# Patient Record
Sex: Female | Born: 1960 | Race: Black or African American | Hispanic: No | Marital: Single | Smoking: Never smoker
Health system: Southern US, Community
[De-identification: ages and names within clinical notes are randomized; demographics above are authoritative.]

## PROBLEM LIST (undated history)

## (undated) DIAGNOSIS — T7840XA Allergy, unspecified, initial encounter: Secondary | ICD-10-CM

## (undated) DIAGNOSIS — E785 Hyperlipidemia, unspecified: Secondary | ICD-10-CM

## (undated) DIAGNOSIS — M1990 Unspecified osteoarthritis, unspecified site: Secondary | ICD-10-CM

## (undated) DIAGNOSIS — I10 Essential (primary) hypertension: Secondary | ICD-10-CM

## (undated) HISTORY — DX: Unspecified osteoarthritis, unspecified site: M19.90

## (undated) HISTORY — PX: BREAST SURGERY: SHX581

## (undated) HISTORY — DX: Hyperlipidemia, unspecified: E78.5

## (undated) HISTORY — DX: Allergy, unspecified, initial encounter: T78.40XA

## (undated) HISTORY — DX: Essential (primary) hypertension: I10

---

## 1978-04-01 HISTORY — PX: BREAST CYST EXCISION: SHX579

## 1990-04-01 HISTORY — PX: TUBAL LIGATION: SHX77

## 1992-04-01 HISTORY — PX: KNEE SURGERY: SHX244

## 1996-04-01 HISTORY — PX: APPENDECTOMY: SHX54

## 1998-03-30 ENCOUNTER — Other Ambulatory Visit: Admission: RE | Admit: 1998-03-30 | Discharge: 1998-03-30 | Payer: Self-pay | Admitting: *Deleted

## 1998-09-04 ENCOUNTER — Emergency Department (HOSPITAL_COMMUNITY): Admission: EM | Admit: 1998-09-04 | Discharge: 1998-09-04 | Payer: Self-pay | Admitting: Internal Medicine

## 1999-02-27 ENCOUNTER — Emergency Department (HOSPITAL_COMMUNITY): Admission: EM | Admit: 1999-02-27 | Discharge: 1999-02-27 | Payer: Self-pay | Admitting: Emergency Medicine

## 1999-05-10 ENCOUNTER — Other Ambulatory Visit: Admission: RE | Admit: 1999-05-10 | Discharge: 1999-05-10 | Payer: Self-pay | Admitting: *Deleted

## 1999-09-23 ENCOUNTER — Encounter: Payer: Self-pay | Admitting: Emergency Medicine

## 1999-09-23 ENCOUNTER — Emergency Department (HOSPITAL_COMMUNITY): Admission: EM | Admit: 1999-09-23 | Discharge: 1999-09-23 | Payer: Self-pay | Admitting: Emergency Medicine

## 2000-05-30 ENCOUNTER — Other Ambulatory Visit: Admission: RE | Admit: 2000-05-30 | Discharge: 2000-05-30 | Payer: Self-pay | Admitting: *Deleted

## 2000-07-14 ENCOUNTER — Emergency Department (HOSPITAL_COMMUNITY): Admission: EM | Admit: 2000-07-14 | Discharge: 2000-07-14 | Payer: Self-pay | Admitting: Emergency Medicine

## 2001-06-09 ENCOUNTER — Other Ambulatory Visit: Admission: RE | Admit: 2001-06-09 | Discharge: 2001-06-09 | Payer: Self-pay | Admitting: *Deleted

## 2001-07-30 ENCOUNTER — Encounter: Payer: Self-pay | Admitting: Emergency Medicine

## 2001-07-30 ENCOUNTER — Emergency Department (HOSPITAL_COMMUNITY): Admission: EM | Admit: 2001-07-30 | Discharge: 2001-07-31 | Payer: Self-pay | Admitting: Emergency Medicine

## 2001-08-10 ENCOUNTER — Encounter (INDEPENDENT_AMBULATORY_CARE_PROVIDER_SITE_OTHER): Payer: Self-pay

## 2001-08-11 ENCOUNTER — Encounter: Payer: Self-pay | Admitting: Emergency Medicine

## 2001-08-11 ENCOUNTER — Observation Stay (HOSPITAL_COMMUNITY): Admission: EM | Admit: 2001-08-11 | Discharge: 2001-08-12 | Payer: Self-pay | Admitting: Emergency Medicine

## 2001-08-11 ENCOUNTER — Encounter: Payer: Self-pay | Admitting: Surgery

## 2002-11-23 ENCOUNTER — Emergency Department (HOSPITAL_COMMUNITY): Admission: EM | Admit: 2002-11-23 | Discharge: 2002-11-23 | Payer: Self-pay | Admitting: Emergency Medicine

## 2002-11-23 ENCOUNTER — Encounter: Payer: Self-pay | Admitting: Emergency Medicine

## 2003-02-12 ENCOUNTER — Emergency Department (HOSPITAL_COMMUNITY): Admission: AD | Admit: 2003-02-12 | Discharge: 2003-02-12 | Payer: Self-pay | Admitting: Family Medicine

## 2003-07-06 ENCOUNTER — Emergency Department (HOSPITAL_COMMUNITY): Admission: AD | Admit: 2003-07-06 | Discharge: 2003-07-06 | Payer: Self-pay | Admitting: Family Medicine

## 2004-01-27 ENCOUNTER — Emergency Department (HOSPITAL_COMMUNITY): Admission: EM | Admit: 2004-01-27 | Discharge: 2004-01-27 | Payer: Self-pay | Admitting: Emergency Medicine

## 2004-04-16 ENCOUNTER — Encounter (INDEPENDENT_AMBULATORY_CARE_PROVIDER_SITE_OTHER): Payer: Self-pay | Admitting: Specialist

## 2004-04-16 ENCOUNTER — Ambulatory Visit (HOSPITAL_COMMUNITY): Admission: RE | Admit: 2004-04-16 | Discharge: 2004-04-16 | Payer: Self-pay | Admitting: *Deleted

## 2004-05-07 ENCOUNTER — Emergency Department (HOSPITAL_COMMUNITY): Admission: EM | Admit: 2004-05-07 | Discharge: 2004-05-07 | Payer: Self-pay | Admitting: Family Medicine

## 2004-08-21 ENCOUNTER — Emergency Department (HOSPITAL_COMMUNITY): Admission: EM | Admit: 2004-08-21 | Discharge: 2004-08-21 | Payer: Self-pay | Admitting: Family Medicine

## 2004-12-23 ENCOUNTER — Emergency Department (HOSPITAL_COMMUNITY): Admission: EM | Admit: 2004-12-23 | Discharge: 2004-12-23 | Payer: Self-pay | Admitting: Emergency Medicine

## 2004-12-26 ENCOUNTER — Emergency Department (HOSPITAL_COMMUNITY): Admission: EM | Admit: 2004-12-26 | Discharge: 2004-12-26 | Payer: Self-pay | Admitting: Family Medicine

## 2005-04-04 ENCOUNTER — Emergency Department (HOSPITAL_COMMUNITY): Admission: EM | Admit: 2005-04-04 | Discharge: 2005-04-04 | Payer: Self-pay | Admitting: Emergency Medicine

## 2006-07-07 ENCOUNTER — Emergency Department (HOSPITAL_COMMUNITY): Admission: EM | Admit: 2006-07-07 | Discharge: 2006-07-07 | Payer: Self-pay | Admitting: Family Medicine

## 2008-09-01 ENCOUNTER — Emergency Department (HOSPITAL_COMMUNITY): Admission: EM | Admit: 2008-09-01 | Discharge: 2008-09-01 | Payer: Self-pay | Admitting: Emergency Medicine

## 2009-06-01 ENCOUNTER — Emergency Department (HOSPITAL_COMMUNITY): Admission: EM | Admit: 2009-06-01 | Discharge: 2009-06-01 | Payer: Self-pay | Admitting: Emergency Medicine

## 2009-12-22 ENCOUNTER — Emergency Department (HOSPITAL_COMMUNITY): Admission: EM | Admit: 2009-12-22 | Discharge: 2009-12-22 | Payer: Self-pay | Admitting: Family Medicine

## 2010-02-09 ENCOUNTER — Emergency Department (HOSPITAL_COMMUNITY): Admission: EM | Admit: 2010-02-09 | Discharge: 2010-02-09 | Payer: Self-pay | Admitting: Emergency Medicine

## 2010-06-12 LAB — URINALYSIS, ROUTINE W REFLEX MICROSCOPIC
Specific Gravity, Urine: 1.021 (ref 1.005–1.030)
pH: 5.5 (ref 5.0–8.0)

## 2010-06-22 LAB — DIFFERENTIAL
Basophils Absolute: 0.1 10*3/uL (ref 0.0–0.1)
Basophils Relative: 1 % (ref 0–1)
Lymphocytes Relative: 33 % (ref 12–46)
Lymphs Abs: 3.3 10*3/uL (ref 0.7–4.0)

## 2010-06-22 LAB — COMPREHENSIVE METABOLIC PANEL
Albumin: 4 g/dL (ref 3.5–5.2)
CO2: 30 mEq/L (ref 19–32)
Calcium: 9.6 mg/dL (ref 8.4–10.5)
Chloride: 102 mEq/L (ref 96–112)
Creatinine, Ser: 0.89 mg/dL (ref 0.4–1.2)
GFR calc Af Amer: >60 mL/min (ref >60)
GFR calc non Af Amer: >60 mL/min (ref >60)
Glucose, Bld: 101 mg/dL — ABNORMAL HIGH (ref 70–99)
Potassium: 4.9 mEq/L (ref 3.5–5.1)

## 2010-06-22 LAB — URINALYSIS, ROUTINE W REFLEX MICROSCOPIC
Hgb urine dipstick: NEGATIVE
Ketones, ur: NEGATIVE mg/dL
Nitrite: NEGATIVE
Protein, ur: 30 mg/dL — AB
Urobilinogen, UA: 1 mg/dL (ref 0.0–1.0)
pH: 7 (ref 5.0–8.0)

## 2010-06-22 LAB — URINE MICROSCOPIC-ADD ON

## 2010-06-22 LAB — LIPASE, BLOOD: Lipase: 38 U/L (ref 11–59)

## 2010-06-22 LAB — CBC
MCV: 82.3 fL (ref 78.0–100.0)
Platelets: 278 10*3/uL (ref 150–400)
RDW: 14.2 % (ref 11.5–15.5)
WBC: 10 10*3/uL (ref 4.0–10.5)

## 2010-08-17 NOTE — Op Note (Signed)
[facility]  Patient:    Mackenzie Everett, Mackenzie Everett Visit Number: [id number] MRN: [id number]          Service Type: MED Location: 1E 0102 01 Attending Physician:  Nelia Shi Dictated by:   Thornton Park Daphine Deutscher, M.D. Proc. Date: 08/11/01 Admit Date:  08/10/2001                             Operative Report  PREOPERATIVE DIAGNOSIS:  Acute appendicitis.  POSTOPERATIVE DIAGNOSIS:  Acute appendicitis.  PROCEDURE:  Laparoscopic appendectomy.  SURGEON:  Thornton Park. Daphine Deutscher, M.D.  ANESTHESIA:  General.  DESCRIPTION OF PROCEDURE:  Mackenzie Everett is a 50 year old lady, who presented to the ED, with lower abdominal pain of one days duration.  CT scan was performed, and this showed a thickened appendix consistent with appendicitis. She was seen in the ED by me, and then informed consent was obtained regarding laparoscopic as well as open appendectomy.  The patient was taken to room 1, given general anesthesia.  She had previously gotten Cefotan intravenously.  The abdomen was prepped with Betadine and draped sterilely.  A longitudinal incision was made down into the umbilicus, and through this port, Hasson cannula was placed.  A 10-11 was placed in the left lower quadrant, a 5 mm in right upper quadrant.  The appendix itself appeared huge and was thickened.  It looked like she had just recently ruptured an ovarian follicle.  There was not very much blood in the pelvis, however.  The appendix being real thickened, I went ahead and elevated it and isolated the base and transected with the Endo GIA.  The mesentery of this long appendix was such that it took several applications of the stapler to divide it, and then it was placed in a bag and brought out through the umbilicus.  The area was irrigated.  There was no active bleeding noted.  The healthy stump remained.  Again, the area was surveyed, and no bleeding was noted.  The port sites were injected with 0.25% Marcaine with  epinephrine. The umbilical defect was repaired with two sutures of 0 Vicryl.  The patient seemed to tolerate the procedure well.  Abdomen was deflated; the port sites were removed, and the skin was closed with 4-0 Vicryl with Benzoin and Steri-Strips.  FINAL DIAGNOSIS:  Acute appendicitis, status post laparoscopic appendectomy. Dictated by:   Thornton Park Daphine Deutscher, M.D. Attending Physician:  Nelia Shi DD:  08/11/01 TD:  08/11/01 Job: [id number]

## 2010-08-17 NOTE — Op Note (Signed)
NAME:  Mackenzie Everett, Mackenzie Everett                  ACCOUNT NO.:  [id number]   MEDICAL RECORD NO.:  [id number]          PATIENT TYPE:  AMB   LOCATION:  SDC                           FACILITY:  [facility]   PHYSICIAN:  Pershing Cox, M.D.DATE OF BIRTH:  May 02, 1960   DATE OF PROCEDURE:  04/16/2004  DATE OF DISCHARGE:                                 OPERATIVE REPORT   PREOPERATIVE DIAGNOSES:  Menorrhagia and endometrial polyp on hydrosonogram.   POSTOPERATIVE DIAGNOSES:  Menorrhagia and small polyp.   PROCEDURE:  Examination under anesthesia, dilatation and curettage,  hysteroscopy with polyp extraction.  NovaSure endometrial ablation.   SURGEON:  Dr. Carey Bullocks.   ANESTHESIA:  General by LMA.   INDICATIONS FOR PROCEDURE:  Mackenzie Everett is 50 years old.  She has had significant  menorrhagia with pad change every two hours on three heavy days.  She has  also had large clots and night swelling.  On hydrosonogram, she was found to  have a small fundal polyp and is brought to the operating room today for  removal of this polyp and NovaSure endometrial ablation.   FINDINGS:  The patient's endometrial cavity sounded to a depth of 9 cm.  The  cervix sounded to 3.5 cm.  There was a filling defect at the fundus of the  uterus.  This was extracted with polyps, forceps and curettage and at the  end of the procedure, photographs documented that it was gone.   PROCEDURE:  Mackenzie Everett was brought to the operating room with an IV in  place. She received 400 mg of ciprofloxacin which was completed on the  operating room table.  Supine on the OR table, general endotracheal  anesthesia was administered by the placement of an LMA.  The patient was  adequately sedated and then placed into Allen stirrups.  Examination under  anesthesia was performed.  The patient was then prepped with solution of  Hibiclens, cleansing the peroneum and vagina.  The Foley catheter was used  to empty the bladder of a small amount of urine.   The  patient was then draped for a sterile vaginal procedure.  Bivalve  speculum was inserted into the vagina and the cervix was grasped with a  single-toothed tenaculum.  The tenaculum was positioned several times in  order to be able to move the cervix into a position for straight dilatation.  A Simms retractor was used to retract the posterior wall of the uterus as a  weighted vaginal speculum was not adequate.  The Kevorkian curet was used to  obtain endocervical curetting's.  The Hagar dilator was then used to assess  the depth of the endocervical canal which was felt to be 3.5 cm.  The  endometrial canal sounded to a depth of 9 cm.  This depth was subtracted  from the cervical length and set into the NovaSure for later ablation.  The  observer hysteroscope was introduced into the endometrial cavity after  dilating the cervix to size 21 Pratt dilator.  Hysteroscopic view of the  endometrium was obtained.  There was a lot of endometrium  and there appeared  to be a polyp sitting at the fundus.  This was rounded in shape.  Using the  polyp forceps and the small sharp curet, the cavity was curetted.  The polyp  was then lifted and extracted with polyp forceps.  It appeared that the  polyp was easily removed.  With this information, we then pursued with the  NovaSure ablation.  With the appropriate setting dialed as 5.5 inches as the  patient's depth of cavity, the instrument was introduced into the  endometrial cavity until the fundus was touched and then retracted about 1.5  to 2 cm.  The instrument was closed and then seated appropriately into the  endometrial cavity by pushing, pulling, rotating and then finally accessing  the cavity width of about 4.8 cm.  The collar was placed against the cervix  and the cavity was sounded as intact.  The NovaSure instrument was then  applied to the cavity for one minute and one second to complete the ablation  procedure.  The array was closed and the  instrument was retracted.  A  photograph was taken of the top of the endometrium to show that the ablation  procedure had been complete.   The patient tolerated the procedure well and was taken to the recovery room  for observation.  She received Toradol in the recovery room.     Maur   MAJ/MEDQ  D:  04/16/2004  T:  04/16/2004  Job:  [id number]

## 2011-08-30 ENCOUNTER — Ambulatory Visit (INDEPENDENT_AMBULATORY_CARE_PROVIDER_SITE_OTHER): Payer: BC Managed Care – PPO | Admitting: Family Medicine

## 2011-08-30 ENCOUNTER — Encounter: Payer: Self-pay | Admitting: Family Medicine

## 2011-08-30 VITALS — BP 139/82 | HR 68 | Temp 98.0°F | Resp 18 | Ht 69.5 in | Wt 293.0 lb

## 2011-08-30 DIAGNOSIS — Z0000 Encounter for general adult medical examination without abnormal findings: Secondary | ICD-10-CM

## 2011-08-30 DIAGNOSIS — Z1231 Encounter for screening mammogram for malignant neoplasm of breast: Secondary | ICD-10-CM

## 2011-08-30 DIAGNOSIS — Z124 Encounter for screening for malignant neoplasm of cervix: Secondary | ICD-10-CM

## 2011-08-30 DIAGNOSIS — Z01419 Encounter for gynecological examination (general) (routine) without abnormal findings: Secondary | ICD-10-CM

## 2011-08-30 DIAGNOSIS — G43809 Other migraine, not intractable, without status migrainosus: Secondary | ICD-10-CM

## 2011-08-30 DIAGNOSIS — M5136 Other intervertebral disc degeneration, lumbar region: Secondary | ICD-10-CM

## 2011-08-30 DIAGNOSIS — Z1239 Encounter for other screening for malignant neoplasm of breast: Secondary | ICD-10-CM

## 2011-08-30 DIAGNOSIS — M5137 Other intervertebral disc degeneration, lumbosacral region: Secondary | ICD-10-CM

## 2011-08-30 LAB — POCT URINALYSIS DIPSTICK
Bilirubin, UA: NEGATIVE
Glucose, UA: NEGATIVE
Nitrite, UA: NEGATIVE
Protein, UA: 100

## 2011-08-30 LAB — LIPID PANEL
HDL: 60 mg/dL (ref >39)
LDL Cholesterol: 146 mg/dL — ABNORMAL HIGH (ref 0–99)
VLDL: 16 mg/dL (ref 0–40)

## 2011-08-30 LAB — POCT WET PREP WITH KOH

## 2011-08-30 LAB — COMPREHENSIVE METABOLIC PANEL
Albumin: 4.4 g/dL (ref 3.5–5.2)
BUN: 14 mg/dL (ref 6–23)
Glucose, Bld: 86 mg/dL (ref 70–99)
Potassium: 4.4 mEq/L (ref 3.5–5.3)
Sodium: 138 mEq/L (ref 135–145)

## 2011-08-30 LAB — TSH: TSH: 0.958 u[IU]/mL (ref 0.350–4.500)

## 2011-08-30 MED ORDER — RIZATRIPTAN BENZOATE 10 MG PO TABS: 10.0000 mg | ORAL_TABLET | ORAL | Status: DC | PRN

## 2011-08-30 NOTE — Patient Instructions (Signed)
Migraine Headache A migraine headache is an intense, throbbing pain on one or both sides of your head. The exact cause of a migraine headache is not always known. A migraine may be caused when nerves in the brain become irritated and release chemicals that cause swelling within blood vessels, causing pain. Many migraine sufferers have a family history of migraines. Before you get a migraine you may or may not get an aura. An aura is a group of symptoms that can predict the beginning of a migraine. An aura may include:  Visual changes such as:   Flashing lights.   Bright spots or zig-zag lines.   Tunnel vision.   Feelings of numbness.   Trouble talking.   Muscle weakness.  SYMPTOMS  Pain on one or both sides of your head.   Pain that is pulsating or throbbing in nature.   Pain that is severe enough to prevent daily activities.   Pain that is aggravated by any daily physical activity.   Nausea (feeling sick to your stomach), vomiting, or both.   Pain with exposure to bright lights, loud noises, or activity.   General sensitivity to bright lights or loud noises.  MIGRAINE TRIGGERS Examples of triggers of migraine headaches include:   Alcohol.   Smoking.   Stress.   It may be related to menses (female menstruation).   Aged cheeses.   Foods or drinks that contain nitrates, glutamate, aspartame, or tyramine.   Lack of sleep.   Chocolate.   Caffeine.   Hunger.   Medications such as nitroglycerine (used to treat chest pain), birth control pills, estrogen, and some blood pressure medications.  DIAGNOSIS  A migraine headache is often diagnosed based on:  Symptoms.   Physical examination.   A computerized X-ray scan (computed tomography, CT) of your head.  TREATMENT  Medications can help prevent migraines if they are recurrent or should they become recurrent. Your caregiver can help you with a medication or treatment program that will be helpful to you.   Lying  down in a dark, quiet room may be helpful.   Keeping a headache diary may help you find a trend as to what may be triggering your headaches.  SEEK IMMEDIATE MEDICAL CARE IF:   You have confusion, personality changes or seizures.   You have headaches that wake you from sleep.   You have an increased frequency in your headaches.   You have a stiff neck.   You have a loss of vision.   You have muscle weakness.   You start losing your balance or have trouble walking.   You feel faint or pass out.  MAKE SURE YOU:   Understand these instructions.   Will watch your condition.   Will get help right away if you are not doing well or get worse.  Document Released: 03/18/2005 Document Revised: 03/07/2011 Document Reviewed: 11/01/2008 Exit[employer] Patient Information 2012 [employer], [place]     Obesity Obesity is defined as having a body mass index (BMI) of 30 or more. To calculate your BMI divide your weight in pounds by your height in inches squared and multiply that product by 703. Major illnesses resulting from long-term obesity include:  Stroke.   Heart disease.   Diabetes.   Many cancers.   Arthritis.  Obesity also complicates recovery from many other medical problems.  CAUSES   A history of obesity in your parents.   Thyroid hormone imbalance.   Environmental factors such as excess calorie intake and physical  inactivity.  TREATMENT  A healthy weight loss program includes:  A calorie restricted diet based on individual calorie needs.   Increased physical activity (exercise).  An exercise program is just as important as the right low-calorie diet.  Weight-loss medicines should be used only under the supervision of your physician. These medicines help, but only if they are used with diet and exercise programs. Medicines can have side effects including nervousness, nausea, abdominal pain, diarrhea, headache, drowsiness, and depression.  An unhealthy weight loss program  includes:  Fasting.   Fad diets.   Supplements and drugs.  These choices do not succeed in long-term weight control.  HOME CARE INSTRUCTIONS  To help you make the needed dietary changes:   Exercise and perform physical activity as directed by your caregiver.   Keep a daily record of everything you eat. There are many free websites to help you with this. It may be helpful to measure your foods so you can determine if you are eating the correct portion sizes.   Use low-calorie cookbooks or take special cooking classes.   Avoid alcohol. Drink more water and drinks with no calories.   Take vitamins and supplements only as recommended by your caregiver.   Weight loss support groups, Registered Dieticians, counselors, and stress reduction education can also be very helpful.  Document Released: 04/25/2004 Document Revised: 03/07/2011 Document Reviewed: 02/22/2007 [facility] Patient Information 2012 [place], [place].   Marland KitchenKeeping You Healthy  Get These Tests  Blood Pressure- Have your blood pressure checked by your healthcare provider at least once a year.  Normal blood pressure is 120/80.  Weight- Have your body mass index (BMI) calculated to screen for obesity.  BMI is a measure of body fat based on height and weight.  You can calculate your own BMI at https://www.[place]-esparza.com/  Cholesterol- Have your cholesterol checked every year.  Diabetes- Have your blood sugar checked every year if you have high blood pressure, high cholesterol, a family history of diabetes or if you are overweight.  Pap Smear- Have a pap smear every 1 to 3 years if you have been sexually active.  If you are older than 65 and recent pap smears have been normal you may not need additional pap smears.  In addition, if you have had a hysterectomy  For benign disease additional pap smears are not necessary.  Mammogram-Yearly mammograms are essential for early detection of breast cancer  Screening for Colon Cancer-  Colonoscopy starting at age 72. Screening may begin sooner depending on your family history and other health conditions.  Follow up colonoscopy as directed by your Gastroenterologist.  Screening for Osteoporosis- Screening begins at age 93 with bone density scanning, sooner if you are at higher risk for developing Osteoporosis.  Get these medicines  Calcium with Vitamin D- Your body requires 1200-1500 mg of Calcium a day and [phone] IU of Vitamin D a day.  You can only absorb 500 mg of Calcium at a time therefore Calcium must be taken in 2 or 3 separate doses throughout the day.  Hormones- Hormone therapy has been associated with increased risk for certain cancers and heart disease.  Talk to your healthcare provider about if you need relief from menopausal symptoms.  Aspirin- Ask your healthcare provider about taking Aspirin to prevent Heart Disease and Stroke.  Get these Immuniztions  Flu shot- Every fall  Pneumonia shot- Once after the age of 29; if you are younger ask your healthcare provider if you need a pneumonia shot.  Tetanus- Every ten years.  Zostavax- Once after the age of 81 to prevent shingles.  Take these steps  Don't smoke- Your healthcare provider can help you quit. For tips on how to quit, ask your healthcare provider or go to www.smokefree.gov or call 1-800 QUIT-NOW.  Be physically active- Exercise 5 days a week for a minimum of 30 minutes.  If you are not already physically active, start slow and gradually work up to 30 minutes of moderate physical activity.  Try walking, dancing, bike riding, swimming, etc.  Eat a healthy diet- Eat a variety of healthy foods such as fruits, vegetables, whole grains, low fat milk, low fat cheeses, yogurt, lean meats, chicken, fish, eggs, dried beans, tofu, etc.  For more information go to www.thenutritionsource.org  Dental visit- Brush and floss teeth twice daily; visit your dentist twice a year.  Eye exam- Visit your Optometrist  or Ophthalmologist yearly.  Drink alcohol in moderation- Limit alcohol intake to one drink or less a day.  Never drink and drive.  Depression- Your emotional health is as important as your physical health.  If you're feeling down or losing interest in things you normally enjoy, please talk to your healthcare provider.  Seat Belts- can save your life; always wear one  Smoke/Carbon Monoxide detectors- These detectors need to be installed on the appropriate level of your home.  Replace batteries at least once a year.  Violence- If anyone is threatening or hurting you, please tell your healthcare provider.  Living Will/ Health care power of attorney- Discuss with your healthcare provider and family.

## 2011-08-31 LAB — VITAMIN D 25 HYDROXY (VIT D DEFICIENCY, FRACTURES): Vit D, 25-Hydroxy: 11 ng/mL — ABNORMAL LOW (ref 30–89)

## 2011-09-04 ENCOUNTER — Encounter: Payer: Self-pay | Admitting: Family Medicine

## 2011-09-04 DIAGNOSIS — G43809 Other migraine, not intractable, without status migrainosus: Secondary | ICD-10-CM | POA: Insufficient documentation

## 2011-09-04 DIAGNOSIS — J45909 Unspecified asthma, uncomplicated: Secondary | ICD-10-CM | POA: Insufficient documentation

## 2011-09-04 DIAGNOSIS — E785 Hyperlipidemia, unspecified: Secondary | ICD-10-CM | POA: Insufficient documentation

## 2011-09-04 DIAGNOSIS — M5136 Other intervertebral disc degeneration, lumbar region: Secondary | ICD-10-CM | POA: Insufficient documentation

## 2011-09-04 HISTORY — DX: Hyperlipidemia, unspecified: E78.5

## 2011-09-04 LAB — PAP IG W/ RFLX HPV ASCU

## 2011-09-04 NOTE — Progress Notes (Signed)
Subjective:    Patient ID: Mackenzie Everett, female    DOB: 07-29-1960, 51 y.o.   MRN: [id number]  HPI  This 51 y.o. AA female is here for CPE/PAP. Her most significant chronic problem is Migraine headache  involving right temple and eye, lasts 2-3 days, associated with nausea and mild visual disturbance. [facility].She tried sister's Maxalt 5 mg with some relief of HA.  She would like RX for this medication.          She works as a Neurosurgeon; nonsmoker and seldom drinks and does not exercise.   OB/GYN hx: 3 pregnancies, 1 abortion, 4 living children   Last PAP: 2009- normal    Review of Systems  Constitutional: Positive for fatigue. Negative for fever and unexpected weight change.  HENT: Positive for sinus pressure and tinnitus. Negative for hearing loss, congestion, sore throat, rhinorrhea, sneezing, trouble swallowing, postnasal drip and ear discharge.   Eyes: Negative.   Respiratory: Negative.   Cardiovascular: Negative.   Gastrointestinal: Negative.   Genitourinary: Positive for urgency. Negative for dysuria, frequency, hematuria, flank pain, vaginal discharge, difficulty urinating, menstrual problem, pelvic pain and dyspareunia.  Musculoskeletal: Negative.   Skin: Negative.   Neurological: Positive for headaches. Negative for dizziness, tremors, seizures, syncope, weakness and numbness.  Hematological: Negative.   Psychiatric/Behavioral: Negative.        Objective:   Physical Exam  Nursing note and vitals reviewed. Constitutional: She is oriented to person, place, and time. She appears well-developed and well-nourished. No distress.  HENT:  Head: Normocephalic and atraumatic.  Right Ear: External ear normal.  Left Ear: External ear normal.  Nose: Nose normal.  Mouth/Throat: Oropharynx is clear and moist.  Eyes: Conjunctivae are normal. Pupils are equal, round, and reactive to light. No scleral icterus.  Neck: Normal range of  motion. Neck supple.  Cardiovascular: Normal rate, regular rhythm, normal heart sounds and intact distal pulses.  Exam reveals no gallop.   No murmur heard. Pulmonary/Chest: Effort normal. No respiratory distress. She has no wheezes. Right breast exhibits no inverted nipple, no mass, no nipple discharge, no skin change and no tenderness. Left breast exhibits no inverted nipple, no mass, no nipple discharge, no skin change and no tenderness. Breasts are symmetrical.  Abdominal: Soft. Bowel sounds are normal. She exhibits no distension and no mass. There is no tenderness. There is no guarding.       No organomegaly  Genitourinary: Rectum normal and uterus normal. Rectal exam shows no external hemorrhoid, no internal hemorrhoid, no mass, no tenderness and anal tone normal. Guaiac negative stool. There is no rash, tenderness or lesion on the right labia. There is no rash, tenderness or lesion on the left labia. Cervix exhibits discharge. Cervix exhibits no motion tenderness and no friability. Right adnexum displays no mass, no tenderness and no fullness. Left adnexum displays no mass, no tenderness and no fullness. No tenderness or bleeding around the vagina. No foreign body around the vagina. Vaginal discharge found.  Lymphadenopathy:    She has no cervical adenopathy.       Right: No inguinal adenopathy present.       Left: No inguinal adenopathy present.  Neurological: She is alert and oriented to person, place, and time. She has normal reflexes. No cranial nerve deficit. She exhibits normal muscle tone. Coordination normal.  Skin: Skin is warm and dry. No rash noted.  Psychiatric: She has a normal mood and affect. Her behavior  is normal. Judgment and thought content normal.          Assessment & Plan:

## 2011-09-05 ENCOUNTER — Telehealth: Payer: Self-pay | Admitting: Family Medicine

## 2011-09-05 MED ORDER — ERGOCALCIFEROL 1.25 MG (50000 UT) PO CAPS: 50000.0000 [IU] | ORAL_CAPSULE | ORAL | Status: DC

## 2011-09-05 NOTE — Progress Notes (Signed)
Quick Note:  Please call pt and advise that the following labs are abnormal... Vitamin D level is low; I have prescribed Vit D3 50,000 IU Take 1 capsule once a week for next several months.  Total cholesterol is elevated; work on healthier nutrition (eliminate fried and processed foods as well as "junk" foods) and be more active. Omega 3 Fish Oil 1200 mg 1 capsule daily will help lower cholesterol.  Thyroid test and chemistries are normal PAP is normal.  Copy to pt. ______

## 2011-09-05 NOTE — Telephone Encounter (Signed)
See note in addendum notification

## 2011-09-05 NOTE — Telephone Encounter (Signed)
Patient returned call and notified of labs. Patient voiced understanding. Copy mailed

## 2011-09-05 NOTE — Telephone Encounter (Signed)
Left message to return call 

## 2011-09-05 NOTE — Progress Notes (Signed)
Addended by: Dow Adolph B on: 09/05/2011 12:23 PM   Modules accepted: Orders

## 2011-09-14 ENCOUNTER — Other Ambulatory Visit: Payer: Self-pay | Admitting: *Deleted

## 2011-09-14 MED ORDER — ERGOCALCIFEROL 1.25 MG (50000 UT) PO CAPS: 50000.0000 [IU] | ORAL_CAPSULE | ORAL | Status: DC

## 2011-09-23 ENCOUNTER — Ambulatory Visit (HOSPITAL_COMMUNITY)
Admission: RE | Admit: 2011-09-23 | Discharge: 2011-09-23 | Disposition: A | Discharge status: Home / Self Care | Payer: BC Managed Care – PPO | Source: Ambulatory Visit | Attending: Family Medicine | Admitting: Family Medicine

## 2011-09-23 DIAGNOSIS — Z1231 Encounter for screening mammogram for malignant neoplasm of breast: Secondary | ICD-10-CM

## 2011-12-30 ENCOUNTER — Ambulatory Visit: Payer: BC Managed Care – PPO

## 2011-12-30 ENCOUNTER — Ambulatory Visit (INDEPENDENT_AMBULATORY_CARE_PROVIDER_SITE_OTHER): Payer: BC Managed Care – PPO | Admitting: Family Medicine

## 2011-12-30 VITALS — BP 144/82 | HR 94 | Temp 98.8°F | Resp 16 | Ht 70.0 in | Wt 296.0 lb

## 2011-12-30 DIAGNOSIS — M545 Low back pain, unspecified: Secondary | ICD-10-CM

## 2011-12-30 DIAGNOSIS — S39012A Strain of muscle, fascia and tendon of lower back, initial encounter: Secondary | ICD-10-CM

## 2011-12-30 DIAGNOSIS — G43909 Migraine, unspecified, not intractable, without status migrainosus: Secondary | ICD-10-CM

## 2011-12-30 MED ORDER — HYDROCODONE-ACETAMINOPHEN 5-500 MG PO TABS: ORAL_TABLET | ORAL | Status: DC

## 2011-12-30 MED ORDER — TIZANIDINE HCL 4 MG PO TABS: ORAL_TABLET | ORAL | Status: DC

## 2011-12-30 MED ORDER — DICLOFENAC SODIUM 75 MG PO TBEC: 75.0000 mg | DELAYED_RELEASE_TABLET | Freq: Two times a day (BID) | ORAL | Status: DC

## 2011-12-30 NOTE — Patient Instructions (Addendum)
Work hard on weight loss  Walk for exercise  Take meds as ordered.    If worse may need physical therapy for back or neurology referral for headaches

## 2011-12-30 NOTE — Progress Notes (Signed)
S: Migraine 4-5 days. Maxalt helps a while , but max of 2 a day does not solve it.  Missed 1/2 day Thurs , then again Friday.  Missed all day today.    Yesterday tried to get up from couch.  Acute low back and hip pain .  No other cause known.  O: HEENT normal.  Fundi benign.  No bruits.  Alert, oriented. Heart rrr Abd soft.  Tender low lumbar.  Muscles a little sore. ROM decreased. SLR tight but ok to 75 degrees bilaterally  A: Back pain acute  R/o compression fx.  Prob. Strain. Migraine, prolonged.  Plan: ls spine 2 view.  [facility] reading (PRIMARY) by  Dr. 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    See orders

## 2012-02-26 ENCOUNTER — Ambulatory Visit (INDEPENDENT_AMBULATORY_CARE_PROVIDER_SITE_OTHER): Payer: BC Managed Care – PPO | Admitting: Family Medicine

## 2012-02-26 VITALS — BP 122/78 | HR 101 | Temp 98.4°F | Resp 17 | Ht 71.0 in | Wt 301.0 lb

## 2012-02-26 DIAGNOSIS — Z23 Encounter for immunization: Secondary | ICD-10-CM

## 2012-02-26 DIAGNOSIS — J029 Acute pharyngitis, unspecified: Secondary | ICD-10-CM

## 2012-02-26 MED ORDER — AZITHROMYCIN 250 MG PO TABS: ORAL_TABLET | ORAL | Status: DC

## 2012-02-26 NOTE — Progress Notes (Signed)
@[email or web address] @   Patient ID: DEAJA RIZO MRN: [id number], DOB: June 25, 1960, 51 y.o. Date of Encounter: 02/26/2012, 12:17 PM  Primary Physician: No primary provider on file.  Chief Complaint:  Chief Complaint  Patient presents with  . Sore Throat  . Nasal Congestion    mucus   . Chills    HPI: 51 y.o. year old female presents with day history of sore throat. Subjective fever and chills. No cough, congestion, rhinorrhea, sinus pressure, otalgia, or headache. Normal hearing. No GI complaints. Able to swallow saliva, but hurts to do so. Decreased appetite secondary to sore throat.   Past Medical History  Diagnosis Date  . Allergy   . Arthritis   . Asthma   . Hyperlipidemia 09/04/2011     Home Meds: Prior to Admission medications   Medication Sig Start Date End Date Taking? Authorizing Provider  diclofenac (VOLTAREN) 75 MG EC tablet Take 1 tablet (75 mg total) by mouth 2 (two) times daily. 12/30/11  Yes Peyton Najjar, MD  ergocalciferol (DRISDOL) 50000 UNITS capsule Take 1 capsule (50,000 Units total) by mouth once a week. 09/14/11 09/13/12 Yes Maurice March, MD  fish oil-omega-3 fatty acids 1000 MG capsule Take 2 g by mouth daily.   Yes Historical Provider, MD  HYDROcodone-acetaminophen (VICODIN) 5-500 MG per tablet Use as needed for severe pain of back or headache 12/30/11  Yes Peyton Najjar, MD  Multiple Vitamin (MULTIVITAMIN) capsule Take 1 capsule by mouth daily.   Yes Historical Provider, MD  rizatriptan (MAXALT) 10 MG tablet Take 1 tablet (10 mg total) by mouth as needed for migraine. May repeat in 2 hours if needed 08/30/11 08/29/12 Yes Maurice March, MD  tiZANidine (ZANAFLEX) 4 MG tablet One every 6-8 hours for muscle relaxant and migraine as needed 12/30/11  Yes Peyton Najjar, MD  azithromycin (ZITHROMAX Z-PAK) 250 MG tablet Take as directed on pack 02/26/12   Elvina Sidle, MD    Allergies:  Allergies  Allergen Reactions  . Caffeine Other (See Comments)   migraines  . Penicillins Hives    History   Social History  . Marital Status: Single    Spouse Name: N/A    Number of Children: N/A  . Years of Education: N/A   Occupational History  . Not on file.   Social History Main Topics  . Smoking status: Never Smoker   . Smokeless tobacco: Not on file  . Alcohol Use: No     Comment: social  . Drug Use: No  . Sexually Active: No   Other Topics Concern  . Not on file   Social History Narrative  . No narrative on file     Review of Systems: Constitutional: negative for chills, fever, night sweats or weight changes HEENT: see above Cardiovascular: negative for chest pain or palpitations Respiratory: negative for hemoptysis, wheezing, or shortness of breath Abdominal: negative for abdominal pain, nausea, vomiting or diarrhea Dermatological: negative for rash Neurologic: negative for headache   Physical Exam Blood pressure 122/78, pulse 101, temperature 98.4 F (36.9 C), temperature source Oral, resp. rate 17, height 5\' 11"  (1.803 m), weight 301 lb (136.533 kg), SpO2 98.00%., Body mass index is 41.98 kg/(m^2). General: Well developed, well nourished, in no acute distress. Head: Normocephalic, atraumatic, eyes without discharge, sclera non-icteric, nares are patent. Bilateral auditory canals clear, TM's are without perforation, pearly grey with reflective cone of light bilaterally. No sinus TTP. Oral cavity moist, dentition normal. Posterior pharynx with post nasal drip  and mild erythema. No peritonsillar abscess or tonsillar exudate. Neck: Supple. No thyromegaly. Full ROM. No lymphadenopathy. Lungs: Clear bilaterally to auscultation without wheezes, rales, or rhonchi. Breathing is unlabored. Heart: RRR with S1 S2. No murmurs, rubs, or gallops appreciated. Abdomen: Soft, non-tender, non-distended with normoactive bowel sounds. No hepatomegaly. No rebound/guarding. No obvious abdominal masses. Msk:  Strength and tone normal for  age. Extremities: No clubbing or cyanosis. No edema. Neuro: Alert and oriented X 3. Moves all extremities spontaneously. CNII-XII grossly in tact. Psych:  Responds to questions appropriately with a normal affect.     ASSESSMENT AND PLAN:  51 y.o. year old female with pharyngitis 1. Pharyngitis  azithromycin (ZITHROMAX Z-PAK) 250 MG tablet   Flu shot today. - -Tylenol/Motrin prn -Rest/fluids -RTC precautions -RTC 3-5 days if no improvement  Signed, Elvina Sidle, MD 02/26/2012 12:17 PM

## 2012-02-26 NOTE — Patient Instructions (Signed)

## 2012-05-08 ENCOUNTER — Ambulatory Visit (INDEPENDENT_AMBULATORY_CARE_PROVIDER_SITE_OTHER): Payer: BC Managed Care – PPO | Admitting: Family Medicine

## 2012-05-08 VITALS — BP 153/82 | HR 58 | Temp 98.4°F | Resp 18 | Ht 70.0 in | Wt 306.4 lb

## 2012-05-08 DIAGNOSIS — M545 Low back pain, unspecified: Secondary | ICD-10-CM

## 2012-05-08 DIAGNOSIS — S39012A Strain of muscle, fascia and tendon of lower back, initial encounter: Secondary | ICD-10-CM

## 2012-05-08 DIAGNOSIS — R110 Nausea: Secondary | ICD-10-CM

## 2012-05-08 DIAGNOSIS — S335XXA Sprain of ligaments of lumbar spine, initial encounter: Secondary | ICD-10-CM

## 2012-05-08 LAB — POCT UA - MICROSCOPIC ONLY
Amorphous: POSITIVE
Casts, Ur, LPF, POC: NEGATIVE
Crystals, Ur, HPF, POC: NEGATIVE
Yeast, UA: NEGATIVE

## 2012-05-08 LAB — POCT URINALYSIS DIPSTICK: Urobilinogen, UA: 0.2

## 2012-05-08 NOTE — Progress Notes (Signed)
[address]   [place], [place]  [address]   [phone]  Subjective:    Patient ID: Mackenzie Everett, female    DOB: Apr 09, 1960, 52 y.o.   MRN: [id number]  HPIThis 52 y.o. female presents for evaluation of low back pain R. Onset two days ago.  Onset of R lower back pain; no appetite; mildly  Nauseated.  Not able to sleep; pain with sitting and walking.  Cannot get comfortable.  No fever but +chills.  No vomiting; +loose stools yesterday; no constipation; no bloody stools; no melena.  No heartburn.  No dysuria, frequency, hematuria, urgency, nocturia.  Movement and prolonged sitting makes worse.  Chronic back issues; this pain is totally different.  Cramping pain.  No shooting pain.  Constant pain.  Radiates from R back to R lateral side.  No radiation into legs; no n/t/w.  Normal b/b function.  No saddle paresthesias. No rash.  Took Ibuprofen 600 today; no medications yesterday.   Eating has no effect.      Review of Systems  Constitutional: Positive for chills. Negative for fever.  Respiratory: Negative for cough and shortness of breath.   Gastrointestinal: Positive for nausea and diarrhea. Negative for vomiting and abdominal pain.  Genitourinary: Negative for dysuria, urgency, frequency, hematuria, flank pain and pelvic pain.  Musculoskeletal: Positive for myalgias and back pain. Negative for arthralgias.  Skin: Negative for rash.  Neurological: Negative for weakness and numbness.        Past Medical History  Diagnosis Date  . Allergy   . Arthritis   . Asthma   . Hyperlipidemia 09/04/2011    Past Surgical History  Procedure Date  . Appendectomy   . Breast surgery   . Tubal ligation     Prior to Admission medications   Medication Sig Start Date End Date Taking? Authorizing Provider  diclofenac (VOLTAREN) 75 MG EC tablet Take 1 tablet (75 mg total) by mouth 2 (two) times daily. 12/30/11  Yes Peyton Najjar, MD  ergocalciferol (DRISDOL) 50000 UNITS capsule Take 1 capsule (50,000  Units total) by mouth once a week. 09/14/11 09/13/12 Yes Maurice March, MD  fish oil-omega-3 fatty acids 1000 MG capsule Take 2 g by mouth daily.   Yes Historical Provider, MD  HYDROcodone-acetaminophen (VICODIN) 5-500 MG per tablet Use as needed for severe pain of back or headache 12/30/11  Yes Peyton Najjar, MD  rizatriptan (MAXALT) 10 MG tablet Take 1 tablet (10 mg total) by mouth as needed for migraine. May repeat in 2 hours if needed 08/30/11 08/29/12 Yes Maurice March, MD  tiZANidine (ZANAFLEX) 4 MG tablet One every 6-8 hours for muscle relaxant and migraine as needed 12/30/11  Yes Peyton Najjar, MD  azithromycin (ZITHROMAX Z-PAK) 250 MG tablet Take as directed on pack 02/26/12   Elvina Sidle, MD  Multiple Vitamin (MULTIVITAMIN) capsule Take 1 capsule by mouth daily.    Historical Provider, MD    Allergies  Allergen Reactions  . Caffeine Other (See Comments)    migraines  . Penicillins Hives    History   Social History  . Marital Status: Single    Spouse Name: N/A    Number of Children: N/A  . Years of Education: N/A   Occupational History  . Not on file.   Social History Main Topics  . Smoking status: Never Smoker   . Smokeless tobacco: Not on file  . Alcohol Use: No     Comment: social  . Drug  Use: No  . Sexually Active: No   Other Topics Concern  . Not on file   Social History Narrative  . No narrative on file    Family History  Problem Relation Age of Onset  . Arthritis Mother   . Diabetes Mother   . Hyperlipidemia Mother   . Hypertension Mother   . Hyperlipidemia Sister   . Hypertension Sister   . Lupus Sister   . Cancer Maternal Aunt   . Diabetes Maternal Grandmother   . Heart disease Maternal Grandfather   . Heart disease Paternal Grandfather     Objective:   Physical Exam  Nursing note and vitals reviewed. Constitutional: She is oriented to person, place, and time. She appears well-developed and well-nourished.  Eyes: Conjunctivae  normal are normal. Pupils are equal, round, and reactive to light.  Cardiovascular: Normal rate, regular rhythm and normal heart sounds.   No murmur heard. Pulmonary/Chest: Effort normal and breath sounds normal. She has no wheezes. She has no rales.  Abdominal: Soft. Bowel sounds are normal. She exhibits no distension and no mass. There is no tenderness. There is no rebound and no guarding.  Musculoskeletal:       Right hip: She exhibits normal range of motion, normal strength and no tenderness.       Left hip: Normal. She exhibits normal range of motion, normal strength and no tenderness.       Thoracic back: She exhibits no bony tenderness.       Lumbar back: She exhibits pain and spasm. She exhibits normal range of motion, no tenderness and no bony tenderness.       LUMBAR SPINE: NON-TENDER MIDLINE; MILD TTP R LATERAL LUMBAR REGION; STRAIGHT LEG RAISES NEGATIVE; MOTOR 5/5; TOE AND HEEL WALKING INTACT.   B HIPS: FULL ROM OF B HIPS; FULL FLEXION AND EXTENSION; NON-TENDER.  Neurological: She is alert and oriented to person, place, and time. No cranial nerve deficit. She exhibits normal muscle tone. Coordination normal.  Skin: No rash noted.  Psychiatric: She has a normal mood and affect. Her behavior is normal.     Results for orders placed in visit on 05/08/12  POCT UA - MICROSCOPIC ONLY      Component Value Range   WBC, Ur, HPF, POC 0-1     RBC, urine, microscopic 0-3     Bacteria, U Microscopic 2+     Mucus, UA mod     Epithelial cells, urine per micros 0-3     Crystals, Ur, HPF, POC neg     Casts, Ur, LPF, POC neg     Yeast, UA neg     Amorphous pos    POCT URINALYSIS DIPSTICK      Component Value Range   Color, UA yellow     Clarity, UA cloudy     Glucose, UA neg     Bilirubin, UA neg     Ketones, UA trace     Spec Grav, UA >=1.030     Blood, UA trace-intact     pH, UA 6.5     Protein, UA >300     Urobilinogen, UA 0.2     Nitrite, UA neg     Leukocytes, UA Negative             Assessment & Plan:   1. Low back pain  POCT UA - Microscopic Only, POCT urinalysis dipstick, Urine culture  2. Lumbar strain    3.  Nausea  1. Low back pain/Lumbar  strain: [place].  Recommend Ibuprofen 600mg  tid scheduled for next week and then PRN.  Start Zanaflex/muscle relaxer at qhs.  Heat to area bid for 15 minutes. Home exercise program provided to perform daily.  Avoid lifting, repetitive bending, twisting, rotating. 2.  Nausea:  New.  With loose stools; u/a negative; send urine culture.  BRAT diet, hydration.  Seems separate from R lumbar pain.

## 2012-05-08 NOTE — Patient Instructions (Addendum)
1. Low back pain  POCT UA - Microscopic Only, POCT urinalysis dipstick, Urine culture  2. Lumbar strain     Low Back Sprain with Rehab  A sprain is an injury in which a ligament is torn. The ligaments of the lower back are vulnerable to sprains. However, they are strong and require great force to be injured. These ligaments are important for stabilizing the spinal column. Sprains are classified into three categories. Grade 1 sprains cause pain, but the tendon is not lengthened. Grade 2 sprains include a lengthened ligament, due to the ligament being stretched or partially ruptured. With grade 2 sprains there is still function, although the function may be decreased. Grade 3 sprains involve a complete tear of the tendon or muscle, and function is usually impaired. SYMPTOMS   Severe pain in the lower back.  Sometimes, a feeling of a "pop," "snap," or tear, at the time of injury.  Tenderness and sometimes swelling at the injury site.  Uncommonly, bruising (contusion) within 48 hours of injury.  Muscle spasms in the back. CAUSES  Low back sprains occur when a force is placed on the ligaments that is greater than they can handle. Common causes of injury include:  Performing a stressful act while off-balance.  Repetitive stressful activities that involve movement of the lower back.  Direct hit (trauma) to the lower back. RISK INCREASES WITH:  Contact sports (football, wrestling).  Collisions (major skiing accidents).  Sports that require throwing or lifting (baseball, weightlifting).  Sports involving twisting of the spine (gymnastics, diving, tennis, golf).  Poor strength and flexibility.  Inadequate protection.  Previous back injury or surgery (especially fusion). PREVENTION  Wear properly fitted and padded protective equipment.  Warm up and stretch properly before activity.  Allow for adequate recovery between workouts.  Maintain physical fitness:  Strength,  flexibility, and endurance.  Cardiovascular fitness.  Maintain a healthy body weight. PROGNOSIS  If treated properly, low back sprains usually heal with non-surgical treatment. The length of time for healing depends on the severity of the injury.  RELATED COMPLICATIONS   Recurring symptoms, resulting in a chronic problem.  Chronic inflammation and pain in the low back.  Delayed healing or resolution of symptoms, especially if activity is resumed too soon.  Prolonged impairment.  Unstable or arthritic joints of the low back. TREATMENT  Treatment first involves the use of ice and medicine, to reduce pain and inflammation. The use of strengthening and stretching exercises may help reduce pain with activity. These exercises may be performed at home or with a therapist. Severe injuries may require referral to a therapist for further evaluation and treatment, such as ultrasound. Your caregiver may advise that you wear a back brace or corset, to help reduce pain and discomfort. Often, prolonged bed rest results in greater harm then benefit. Corticosteroid injections may be recommended. However, these should be reserved for the most serious cases. It is important to avoid using your back when lifting objects. At night, sleep on your back on a firm mattress, with a pillow placed under your knees. If non-surgical treatment is unsuccessful, surgery may be needed.  MEDICATION   If pain medicine is needed, nonsteroidal anti-inflammatory medicines (aspirin and ibuprofen), or other minor pain relievers (acetaminophen), are often advised.  Do not take pain medicine for 7 days before surgery.  Prescription pain relievers may be given, if your caregiver thinks they are needed. Use only as directed and only as much as you need.  Ointments applied to the  skin may be helpful.  Corticosteroid injections may be given by your caregiver. These injections should be reserved for the most serious cases, because  they may only be given a certain number of times. HEAT AND COLD  Cold treatment (icing) should be applied for 10 to 15 minutes every 2 to 3 hours for inflammation and pain, and immediately after activity that aggravates your symptoms. Use ice packs or an ice massage.  Heat treatment may be used before performing stretching and strengthening activities prescribed by your caregiver, physical therapist, or athletic trainer. Use a heat pack or a warm water soak. SEEK MEDICAL CARE IF:   Symptoms get worse or do not improve in 2 to 4 weeks, despite treatment.  You develop numbness or weakness in either leg.  You lose bowel or bladder function.  Any of the following occur after surgery: fever, increased pain, swelling, redness, drainage of fluids, or bleeding in the affected area.  New, unexplained symptoms develop. (Drugs used in treatment may produce side effects.) EXERCISES  RANGE OF MOTION (ROM) AND STRETCHING EXERCISES - Low Back Sprain Most people with lower back pain will find that their symptoms get worse with excessive bending forward (flexion) or arching at the lower back (extension). The exercises that will help resolve your symptoms will focus on the opposite motion.  Your physician, physical therapist or athletic trainer will help you determine which exercises will be most helpful to resolve your lower back pain. Do not complete any exercises without first consulting with your caregiver. Discontinue any exercises which make your symptoms worse, until you speak to your caregiver. If you have pain, numbness or tingling which travels down into your buttocks, leg or foot, the goal of the therapy is for these symptoms to move closer to your back and eventually resolve. Sometimes, these leg symptoms will get better, but your lower back pain may worsen. This is often an indication of progress in your rehabilitation. Be very alert to any changes in your symptoms and the activities in which you  participated in the 24 hours prior to the change. Sharing this information with your caregiver will allow him or her to most efficiently treat your condition. These exercises may help you when beginning to rehabilitate your injury. Your symptoms may resolve with or without further involvement from your physician, physical therapist or athletic trainer. While completing these exercises, remember:   Restoring tissue flexibility helps normal motion to return to the joints. This allows healthier, less painful movement and activity.  An effective stretch should be held for at least 30 seconds.  A stretch should never be painful. You should only feel a gentle lengthening or release in the stretched tissue. FLEXION RANGE OF MOTION AND STRETCHING EXERCISES: STRETCH  Flexion, Single Knee to Chest   Lie on a firm bed or floor with both legs extended in front of you.  Keeping one leg in contact with the floor, bring your opposite knee to your chest. Hold your leg in place by either grabbing behind your thigh or at your knee.  Pull until you feel a gentle stretch in your low back. Hold __________ seconds.  Slowly release your grasp and repeat the exercise with the opposite side. Repeat __________ times. Complete this exercise __________ times per day.  STRETCH  Flexion, Double Knee to Chest  Lie on a firm bed or floor with both legs extended in front of you.  Keeping one leg in contact with the floor, bring your opposite knee to  your chest.  Tense your stomach muscles to support your back and then lift your other knee to your chest. Hold your legs in place by either grabbing behind your thighs or at your knees.  Pull both knees toward your chest until you feel a gentle stretch in your low back. Hold __________ seconds.  Tense your stomach muscles and slowly return one leg at a time to the floor. Repeat __________ times. Complete this exercise __________ times per day.  STRETCH  Low Trunk  Rotation  Lie on a firm bed or floor. Keeping your legs in front of you, bend your knees so they are both pointed toward the ceiling and your feet are flat on the floor.  Extend your arms out to the side. This will stabilize your upper body by keeping your shoulders in contact with the floor.  Gently and slowly drop both knees together to one side until you feel a gentle stretch in your low back. Hold for __________ seconds.  Tense your stomach muscles to support your lower back as you bring your knees back to the starting position. Repeat the exercise to the other side. Repeat __________ times. Complete this exercise __________ times per day  EXTENSION RANGE OF MOTION AND FLEXIBILITY EXERCISES: STRETCH  Extension, Prone on Elbows   Lie on your stomach on the floor, a bed will be too soft. Place your palms about shoulder width apart and at the height of your head.  Place your elbows under your shoulders. If this is too painful, stack pillows under your chest.  Allow your body to relax so that your hips drop lower and make contact more completely with the floor.  Hold this position for __________ seconds.  Slowly return to lying flat on the floor. Repeat __________ times. Complete this exercise __________ times per day.  RANGE OF MOTION  Extension, Prone Press Ups  Lie on your stomach on the floor, a bed will be too soft. Place your palms about shoulder width apart and at the height of your head.  Keeping your back as relaxed as possible, slowly straighten your elbows while keeping your hips on the floor. You may adjust the placement of your hands to maximize your comfort. As you gain motion, your hands will come more underneath your shoulders.  Hold this position __________ seconds.  Slowly return to lying flat on the floor. Repeat __________ times. Complete this exercise __________ times per day.  RANGE OF MOTION- Quadruped, Neutral Spine   Assume a hands and knees position on a  firm surface. Keep your hands under your shoulders and your knees under your hips. You may place padding under your knees for comfort.  Drop your head and point your tailbone toward the ground below you. This will round out your lower back like an angry cat. Hold this position for __________ seconds.  Slowly lift your head and release your tail bone so that your back sags into a large arch, like an old horse.  Hold this position for __________ seconds.  Repeat this until you feel limber in your low back.  Now, find your "sweet spot." This will be the most comfortable position somewhere between the two previous positions. This is your neutral spine. Once you have found this position, tense your stomach muscles to support your low back.  Hold this position for __________ seconds. Repeat __________ times. Complete this exercise __________ times per day.  STRENGTHENING EXERCISES - Low Back Sprain These exercises may help you when beginning  to rehabilitate your injury. These exercises should be done near your "sweet spot." This is the neutral, low-back arch, somewhere between fully rounded and fully arched, that is your least painful position. When performed in this safe range of motion, these exercises can be used for people who have either a flexion or extension based injury. These exercises may resolve your symptoms with or without further involvement from your physician, physical therapist or athletic trainer. While completing these exercises, remember:   Muscles can gain both the endurance and the strength needed for everyday activities through controlled exercises.  Complete these exercises as instructed by your physician, physical therapist or athletic trainer. Increase the resistance and repetitions only as guided.  You may experience muscle soreness or fatigue, but the pain or discomfort you are trying to eliminate should never worsen during these exercises. If this pain does worsen, stop  and make certain you are following the directions exactly. If the pain is still present after adjustments, discontinue the exercise until you can discuss the trouble with your caregiver. STRENGTHENING Deep Abdominals, Pelvic Tilt   Lie on a firm bed or floor. Keeping your legs in front of you, bend your knees so they are both pointed toward the ceiling and your feet are flat on the floor.  Tense your lower abdominal muscles to press your low back into the floor. This motion will rotate your pelvis so that your tail bone is scooping upwards rather than pointing at your feet or into the floor. With a gentle tension and even breathing, hold this position for __________ seconds. Repeat __________ times. Complete this exercise __________ times per day.  STRENGTHENING  Abdominals, Crunches   Lie on a firm bed or floor. Keeping your legs in front of you, bend your knees so they are both pointed toward the ceiling and your feet are flat on the floor. Cross your arms over your chest.  Slightly tip your chin down without bending your neck.  Tense your abdominals and slowly lift your trunk high enough to just clear your shoulder blades. Lifting higher can put excessive stress on the lower back and does not further strengthen your abdominal muscles.  Control your return to the starting position. Repeat __________ times. Complete this exercise __________ times per day.  STRENGTHENING  Quadruped, Opposite UE/LE Lift   Assume a hands and knees position on a firm surface. Keep your hands under your shoulders and your knees under your hips. You may place padding under your knees for comfort.  Find your neutral spine and gently tense your abdominal muscles so that you can maintain this position. Your shoulders and hips should form a rectangle that is parallel with the floor and is not twisted.  Keeping your trunk steady, lift your right hand no higher than your shoulder and then your left leg no higher than  your hip. Make sure you are not holding your breath. Hold this position for __________ seconds.  Continuing to keep your abdominal muscles tense and your back steady, slowly return to your starting position. Repeat with the opposite arm and leg. Repeat __________ times. Complete this exercise __________ times per day.  STRENGTHENING  Abdominals and Quadriceps, Straight Leg Raise   Lie on a firm bed or floor with both legs extended in front of you.  Keeping one leg in contact with the floor, bend the other knee so that your foot can rest flat on the floor.  Find your neutral spine, and tense your abdominal muscles to  maintain your spinal position throughout the exercise.  Slowly lift your straight leg off the floor about 6 inches for a count of 15, making sure to not hold your breath.  Still keeping your neutral spine, slowly lower your leg all the way to the floor. Repeat this exercise with each leg __________ times. Complete this exercise __________ times per day. POSTURE AND BODY MECHANICS CONSIDERATIONS - Low Back Sprain Keeping correct posture when sitting, standing or completing your activities will reduce the stress put on different body tissues, allowing injured tissues a chance to heal and limiting painful experiences. The following are general guidelines for improved posture. Your physician or physical therapist will provide you with any instructions specific to your needs. While reading these guidelines, remember:  The exercises prescribed by your provider will help you have the flexibility and strength to maintain correct postures.  The correct posture provides the best environment for your joints to work. All of your joints have less wear and tear when properly supported by a spine with good posture. This means you will experience a healthier, less painful body.  Correct posture must be practiced with all of your activities, especially prolonged sitting and standing. Correct  posture is as important when doing repetitive low-stress activities (typing) as it is when doing a single heavy-load activity (lifting). RESTING POSITIONS Consider which positions are most painful for you when choosing a resting position. If you have pain with flexion-based activities (sitting, bending, stooping, squatting), choose a position that allows you to rest in a less flexed posture. You would want to avoid curling into a fetal position on your side. If your pain worsens with extension-based activities (prolonged standing, working overhead), avoid resting in an extended position such as sleeping on your stomach. Most people will find more comfort when they rest with their spine in a more neutral position, neither too rounded nor too arched. Lying on a non-sagging bed on your side with a pillow between your knees, or on your back with a pillow under your knees will often provide some relief. Keep in mind, being in any one position for a prolonged period of time, no matter how correct your posture, can still lead to stiffness. PROPER SITTING POSTURE In order to minimize stress and discomfort on your spine, you must sit with correct posture. Sitting with good posture should be effortless for a healthy body. Returning to good posture is a gradual process. Many people can work toward this most comfortably by using various supports until they have the flexibility and strength to maintain this posture on their own. When sitting with proper posture, your ears will fall over your shoulders and your shoulders will fall over your hips. You should use the back of the chair to support your upper back. Your lower back will be in a neutral position, just slightly arched. You may place a small pillow or folded towel at the base of your lower back for  support.  When working at a desk, create an environment that supports good, upright posture. Without extra support, muscles tire, which leads to excessive strain on  joints and other tissues. Keep these recommendations in mind: CHAIR:  A chair should be able to slide under your desk when your back makes contact with the back of the chair. This allows you to work closely.  The chair's height should allow your eyes to be level with the upper part of your monitor and your hands to be slightly lower than your elbows. BODY  POSITION  Your feet should make contact with the floor. If this is not possible, use a foot rest.  Keep your ears over your shoulders. This will reduce stress on your neck and low back. INCORRECT SITTING POSTURES  If you are feeling tired and unable to assume a healthy sitting posture, do not slouch or slump. This puts excessive strain on your back tissues, causing more damage and pain. Healthier options include:  Using more support, like a lumbar pillow.  Switching tasks to something that requires you to be upright or walking.  Talking a brief walk.  Lying down to rest in a neutral-spine position. PROLONGED STANDING WHILE SLIGHTLY LEANING FORWARD  When completing a task that requires you to lean forward while standing in one place for a long time, place either foot up on a stationary 2-4 inch high object to help maintain the best posture. When both feet are on the ground, the lower back tends to lose its slight inward curve. If this curve flattens (or becomes too large), then the back and your other joints will experience too much stress, tire more quickly, and can cause pain. CORRECT STANDING POSTURES Proper standing posture should be assumed with all daily activities, even if they only take a few moments, like when brushing your teeth. As in sitting, your ears should fall over your shoulders and your shoulders should fall over your hips. You should keep a slight tension in your abdominal muscles to brace your spine. Your tailbone should point down to the ground, not behind your body, resulting in an over-extended swayback posture.   INCORRECT STANDING POSTURES  Common incorrect standing postures include a forward head, locked knees and/or an excessive swayback. WALKING Walk with an upright posture. Your ears, shoulders and hips should all line-up. PROLONGED ACTIVITY IN A FLEXED POSITION When completing a task that requires you to bend forward at your waist or lean over a low surface, try to find a way to stabilize 3 out of 4 of your limbs. You can place a hand or elbow on your thigh or rest a knee on the surface you are reaching across. This will provide you more stability, so that your muscles do not tire as quickly. By keeping your knees relaxed, or slightly bent, you will also reduce stress across your lower back. CORRECT LIFTING TECHNIQUES DO :  Assume a wide stance. This will provide you more stability and the opportunity to get as close as possible to the object which you are lifting.  Tense your abdominals to brace your spine. Bend at the knees and hips. Keeping your back locked in a neutral-spine position, lift using your leg muscles. Lift with your legs, keeping your back straight.  Test the weight of unknown objects before attempting to lift them.  Try to keep your elbows locked down at your sides in order get the best strength from your shoulders when carrying an object.  Always ask for help when lifting heavy or awkward objects. INCORRECT LIFTING TECHNIQUES DO NOT:   Lock your knees when lifting, even if it is a small object.  Bend and twist. Pivot at your feet or move your feet when needing to change directions.  Assume that you can safely pick up even a paperclip without proper posture. Document Released: 03/18/2005 Document Revised: 06/10/2011 Document Reviewed: 06/30/2008 [facility] Patient Information 2013 [place], [place].

## 2012-05-10 LAB — URINE CULTURE: Colony Count: 25000

## 2012-07-02 ENCOUNTER — Emergency Department (HOSPITAL_COMMUNITY)
Admission: EM | Admit: 2012-07-02 | Discharge: 2012-07-02 | Disposition: A | Discharge status: Home / Self Care | Payer: BC Managed Care – PPO | Attending: Emergency Medicine | Admitting: Emergency Medicine

## 2012-07-02 ENCOUNTER — Encounter (HOSPITAL_COMMUNITY): Payer: Self-pay | Admitting: *Deleted

## 2012-07-02 DIAGNOSIS — Z79899 Other long term (current) drug therapy: Secondary | ICD-10-CM | POA: Insufficient documentation

## 2012-07-02 DIAGNOSIS — G43909 Migraine, unspecified, not intractable, without status migrainosus: Secondary | ICD-10-CM | POA: Insufficient documentation

## 2012-07-02 DIAGNOSIS — M1990 Unspecified osteoarthritis, unspecified site: Secondary | ICD-10-CM | POA: Insufficient documentation

## 2012-07-02 DIAGNOSIS — E785 Hyperlipidemia, unspecified: Secondary | ICD-10-CM | POA: Insufficient documentation

## 2012-07-02 DIAGNOSIS — J45909 Unspecified asthma, uncomplicated: Secondary | ICD-10-CM | POA: Insufficient documentation

## 2012-07-02 MED ORDER — LORAZEPAM 2 MG/ML IJ SOLN: 1.0000 mg | Freq: Once | INTRAMUSCULAR | Status: DC

## 2012-07-02 MED ORDER — SODIUM CHLORIDE 0.9 % IV BOLUS (SEPSIS)
500.0000 mL | Freq: Once | INTRAVENOUS | Status: AC
  Administered 2012-07-02: 500 mL via INTRAVENOUS

## 2012-07-02 MED ORDER — METOCLOPRAMIDE HCL 5 MG/ML IJ SOLN
10.0000 mg | Freq: Once | INTRAMUSCULAR | Status: AC
  Administered 2012-07-02: 10 mg via INTRAVENOUS
  Filled 2012-07-02: qty 2

## 2012-07-02 MED ORDER — KETOROLAC TROMETHAMINE 30 MG/ML IJ SOLN
30.0000 mg | Freq: Once | INTRAMUSCULAR | Status: AC
  Administered 2012-07-02: 30 mg via INTRAVENOUS
  Filled 2012-07-02: qty 1

## 2012-07-02 MED ORDER — SODIUM CHLORIDE 0.9 % IV SOLN: INTRAVENOUS | Status: DC

## 2012-07-02 MED ORDER — DIPHENHYDRAMINE HCL 50 MG/ML IJ SOLN
25.0000 mg | Freq: Once | INTRAMUSCULAR | Status: AC
  Administered 2012-07-02: 25 mg via INTRAVENOUS
  Filled 2012-07-02: qty 1

## 2012-07-02 NOTE — ED Notes (Signed)
Pt reports migraine headache that started this am with n/v, sensitivity to light. Pt sts took 1/2 of vicodin and maxal for pain with no relief. Pt had 2 episode of emesis during triage.

## 2012-07-02 NOTE — ED Provider Notes (Signed)
History     CSN: [id number]  Arrival date & time 07/02/12  1032   First MD Initiated Contact with Patient 07/02/12 1033      Chief Complaint  Patient presents with  . Migraine    (Consider location/radiation/quality/duration/timing/severity/associated sxs/prior treatment) HPI Comments: Mackenzie Everett is a 52 y.o. female w a hx of migraines presents to the ER c/o typical migraine. Onset began last night around 3 am and has been gradually worsening. Associated s/s include photophobia, nausea, vomiting, and blurred vision. HA is not positional and mildly relieved by a dark room and laying down. Symptoms are moderate.  Migraine unrelieved by Vicodin and Maxalt.  Patient denies any head trauma, double vision, ataxia, disequilibrium, unilateral weakness, slurred speech, facial droop, fever, night sweats, chills, neck pain.  The history is provided by the patient.    Past Medical History  Diagnosis Date  . Allergy   . Arthritis   . Asthma   . Hyperlipidemia 09/04/2011    Past Surgical History  Procedure Laterality Date  . Appendectomy    . Breast surgery    . Tubal ligation      Family History  Problem Relation Age of Onset  . Arthritis Mother   . Diabetes Mother   . Hyperlipidemia Mother   . Hypertension Mother   . Hyperlipidemia Sister   . Hypertension Sister   . Lupus Sister   . Cancer Maternal Aunt   . Diabetes Maternal Grandmother   . Heart disease Maternal Grandfather   . Heart disease Paternal Grandfather     History  Substance Use Topics  . Smoking status: Never Smoker   . Smokeless tobacco: Not on file  . Alcohol Use: No     Comment: social    OB History   Grav Para Term Preterm Abortions TAB SAB Ect Mult Living                  Review of Systems  Constitutional: Positive for activity change. Negative for fever, chills, diaphoresis and fatigue.  HENT: Negative for ear pain, congestion, facial swelling, neck pain, neck stiffness, sinus pressure and  tinnitus.   Eyes: Positive for photophobia. Negative for redness and visual disturbance.  Respiratory: Negative for cough, shortness of breath, wheezing and stridor.   Cardiovascular: Negative for chest pain.  Gastrointestinal: Positive for nausea and vomiting. Negative for abdominal pain.  Musculoskeletal: Negative for myalgias and gait problem.  Skin: Negative for rash.  Neurological: Positive for headaches. Negative for dizziness, syncope, speech difficulty, weakness, light-headedness and numbness.       No bowel or bladder incontinence.  Psychiatric/Behavioral: Negative for confusion.  All other systems reviewed and are negative.    Allergies  Caffeine and Penicillins  Home Medications   Current Outpatient Rx  Name  Route  Sig  Dispense  Refill  . diclofenac (VOLTAREN) 75 MG EC tablet   Oral   Take 1 tablet (75 mg total) by mouth 2 (two) times daily.   30 tablet   0   . ergocalciferol (DRISDOL) 50000 UNITS capsule   Oral   Take 1 capsule (50,000 Units total) by mouth once a week.   4 capsule   5   . fish oil-omega-3 fatty acids 1000 MG capsule   Oral   Take 2 g by mouth daily.         Marland Kitchen HYDROcodone-acetaminophen (VICODIN) 5-500 MG per tablet      Use as needed for severe pain of back or  headache   25 tablet   0   . rizatriptan (MAXALT) 10 MG tablet   Oral   Take 1 tablet (10 mg total) by mouth as needed for migraine. May repeat in 2 hours if needed   10 tablet   2   . tiZANidine (ZANAFLEX) 4 MG tablet      One every 6-8 hours for muscle relaxant and migraine as needed   30 tablet   0   . azithromycin (ZITHROMAX Z-PAK) 250 MG tablet      Take as directed on pack   6 tablet   0   . Multiple Vitamin (MULTIVITAMIN) capsule   Oral   Take 1 capsule by mouth daily.           BP 149/74  Pulse 66  Temp(Src) 97.4 F (36.3 C) (Oral)  Resp 16  SpO2 100%  Physical Exam  Nursing note and vitals reviewed. Constitutional: She is oriented to person,  place, and time. She appears well-developed and well-nourished. She appears distressed.  HENT:  Head: Normocephalic and atraumatic.  Eyes: Conjunctivae and EOM are normal. Pupils are equal, round, and reactive to light. No scleral icterus.  Neck: Normal range of motion.  Neck supple with no nuchal rigidity, full pain free ROM. No carotid bruit  Cardiovascular: Normal rate, regular rhythm, normal heart sounds and intact distal pulses.   Pulmonary/Chest: Effort normal and breath sounds normal. No respiratory distress.  Musculoskeletal: Normal range of motion.  Neurological: She is alert and oriented to person, place, and time. She has normal strength.  CN III-XII intact, good coordination, normal gait, strength 5/5 bilaterally, intact distal sensation.  Skin: Skin is warm and dry.  No rash, non diaphoretic    ED Course  Procedures (including critical care time)  Labs Reviewed - No data to display No results found.  Medications  0.9 %  sodium chloride infusion (not administered)  LORazepam (ATIVAN) injection 1 mg (not administered)  diphenhydrAMINE (BENADRYL) injection 25 mg (25 mg Intravenous Given 07/02/12 1111)  ketorolac (TORADOL) 30 MG/ML injection 30 mg (30 mg Intravenous Given 07/02/12 1111)  metoCLOPramide (REGLAN) injection 10 mg (10 mg Intravenous Given 07/02/12 1111)  sodium chloride 0.9 % bolus 500 mL (500 mLs Intravenous New Bag/Given 07/02/12 1111)    No diagnosis found.    MDM  Migraine Pt HA treated and improved while in ED.  Presentation is like pts typical HA and non concerning for [facility], ICH, Meningitis, or temporal arteritis. Pt is afebrile with no focal neuro deficits, nuchal rigidity, or change in vision. Pt is to follow up with PCP to discuss prophylactic medication. Pt verbalizes understanding and is agreeable with plan to dc.          Jaci Carrel, [place] 07/02/12 1244

## 2012-07-10 NOTE — ED Provider Notes (Signed)
Medical screening examination/treatment/procedure(s) were performed by non-physician practitioner and as supervising physician I was immediately available for consultation/collaboration.  Derwood Kaplan, MD 07/10/12 1851

## 2012-08-13 ENCOUNTER — Ambulatory Visit (INDEPENDENT_AMBULATORY_CARE_PROVIDER_SITE_OTHER): Payer: BC Managed Care – PPO | Admitting: Emergency Medicine

## 2012-08-13 VITALS — BP 142/82 | HR 78 | Temp 98.4°F | Resp 17 | Ht 70.5 in | Wt 312.0 lb

## 2012-08-13 DIAGNOSIS — M545 Low back pain, unspecified: Secondary | ICD-10-CM

## 2012-08-13 DIAGNOSIS — M5136 Other intervertebral disc degeneration, lumbar region: Secondary | ICD-10-CM

## 2012-08-13 DIAGNOSIS — M5137 Other intervertebral disc degeneration, lumbosacral region: Secondary | ICD-10-CM

## 2012-08-13 DIAGNOSIS — G43909 Migraine, unspecified, not intractable, without status migrainosus: Secondary | ICD-10-CM

## 2012-08-13 DIAGNOSIS — G43809 Other migraine, not intractable, without status migrainosus: Secondary | ICD-10-CM

## 2012-08-13 DIAGNOSIS — S39012D Strain of muscle, fascia and tendon of lower back, subsequent encounter: Secondary | ICD-10-CM

## 2012-08-13 MED ORDER — HYDROCODONE-ACETAMINOPHEN 5-500 MG PO TABS: ORAL_TABLET | ORAL | Status: DC

## 2012-08-13 MED ORDER — TIZANIDINE HCL 4 MG PO TABS: ORAL_TABLET | ORAL | Status: DC

## 2012-08-13 MED ORDER — RIZATRIPTAN BENZOATE 10 MG PO TABS: 10.0000 mg | ORAL_TABLET | ORAL | Status: DC | PRN

## 2012-08-13 NOTE — Progress Notes (Signed)
  Subjective:    Patient ID: Mackenzie Everett, female    DOB: December 23, 1960, 52 y.o.   MRN: [id number]  HPI presents today for refill on maxalt 2 weeks ago "threw" back out. Was having difficulty doing normal daily activities. It has improved but still having difficulty going up stairs and sitting for short period of time.  Pain was radiating down L leg pain and L foot numbness. No dysuria, hematuria, or any other urinary sxs. Has hx of back pain and was going through PT at one point. That did improve. Still tries to do some home exercises. She has not been seen by any of the back specialist.     Review of Systems     Objective:   Physical Exam patient is alert and cooperative in no distress. Pupils are equal round regular react to light direct and consensual neck is supple. Chest is clear to auscultation and percussion heart regular rate no murmurs. There is tenderness over the lower lumbar spine. Deep tendon reflexes are 2+ and symmetrical. Motor strength is 5 out of 5        Assessment & Plan:  Patient needs refill of her migraine medication. She also has significant degenerative disc disease at L4-5. Weight is an issue and we'll discuss possible water aerobics to help with this problem

## 2012-09-08 ENCOUNTER — Ambulatory Visit (INDEPENDENT_AMBULATORY_CARE_PROVIDER_SITE_OTHER): Payer: BC Managed Care – PPO | Admitting: Family Medicine

## 2012-09-08 VITALS — BP 138/80 | HR 68 | Temp 98.1°F | Resp 18 | Ht 70.0 in | Wt 315.0 lb

## 2012-09-08 DIAGNOSIS — M7750 Other enthesopathy of unspecified foot: Secondary | ICD-10-CM

## 2012-09-08 DIAGNOSIS — M659 Synovitis and tenosynovitis, unspecified: Secondary | ICD-10-CM

## 2012-09-08 MED ORDER — PREDNISONE 20 MG PO TABS: ORAL_TABLET | ORAL | Status: DC

## 2012-09-08 NOTE — Progress Notes (Signed)
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  This morning the foot became too painful to wear shoes or even touch, and the pain is precipitated by any movement of foot or lateral toes. Works at [employer]  Objective:  NAD No foot asymmetry Very tender dorsal left lateral foot  Assessment:  Foot tendonitis.  Plan:  Small prednisone taper 20 mg 3-2-1-1-1  KL, MD

## 2012-09-08 NOTE — Patient Instructions (Signed)
Tendinitis  Tendinitis is swelling and inflammation of the tendons. Tendons are band-like tissues that connect muscle to bone. Tendinitis commonly occurs in the:    Shoulders (rotator cuff).   Heels (Achilles tendon).   Elbows (triceps tendon).  CAUSES  Tendinitis is usually caused by overusing the tendon, muscles, and joints involved. When the tissue surrounding a tendon (synovium) becomes inflamed, it is called tenosynovitis. Tendinitis commonly develops in people whose jobs require repetitive motions.  SYMPTOMS   Pain.   Tenderness.   Mild swelling.  DIAGNOSIS  Tendinitis is usually diagnosed by physical exam. Your caregiver may also order X-rays or other imaging tests.  TREATMENT  Your caregiver may recommend certain medicines or exercises for your treatment.  HOME CARE INSTRUCTIONS    Use a sling or splint for as long as directed by your caregiver until the pain decreases.   Put ice on the injured area.   Put ice in a plastic bag.   Place a towel between your skin and the bag.   Leave the ice on for 15-20 minutes, 3-4 times a day.   Avoid using the limb while the tendon is painful. Perform gentle range of motion exercises only as directed by your caregiver. Stop exercises if pain or discomfort increase, unless directed otherwise by your caregiver.   Only take over-the-counter or prescription medicines for pain, discomfort, or fever as directed by your caregiver.  SEEK MEDICAL CARE IF:    Your pain and swelling increase.   You develop new, unexplained symptoms, especially increased numbness in the hands.  MAKE SURE YOU:    Understand these instructions.   Will watch your condition.   Will get help right away if you are not doing well or get worse.  Document Released: 03/15/2000 Document Revised: 06/10/2011 Document Reviewed: 06/04/2010  Exit[employer] Patient Information 2014 [employer].

## 2012-09-16 ENCOUNTER — Ambulatory Visit (INDEPENDENT_AMBULATORY_CARE_PROVIDER_SITE_OTHER): Payer: BC Managed Care – PPO | Admitting: Family Medicine

## 2012-09-16 ENCOUNTER — Ambulatory Visit: Payer: BC Managed Care – PPO

## 2012-09-16 ENCOUNTER — Other Ambulatory Visit: Payer: Self-pay | Admitting: Family Medicine

## 2012-09-16 VITALS — BP 152/94 | HR 60 | Temp 97.9°F | Resp 16 | Ht 69.75 in | Wt 316.8 lb

## 2012-09-16 DIAGNOSIS — M25572 Pain in left ankle and joints of left foot: Secondary | ICD-10-CM

## 2012-09-16 DIAGNOSIS — M25579 Pain in unspecified ankle and joints of unspecified foot: Secondary | ICD-10-CM

## 2012-09-16 DIAGNOSIS — M25474 Effusion, right foot: Secondary | ICD-10-CM

## 2012-09-16 DIAGNOSIS — M25476 Effusion, unspecified foot: Secondary | ICD-10-CM

## 2012-09-16 DIAGNOSIS — M25473 Effusion, unspecified ankle: Secondary | ICD-10-CM

## 2012-09-16 LAB — POCT SEDIMENTATION RATE: POCT SED RATE: 39 mm/h — AB (ref 0–22)

## 2012-09-16 LAB — POCT CBC
Granulocyte percent: 59.1 %G (ref 37–80)
HCT, POC: 42.3 % (ref 37.7–47.9)
Hemoglobin: 13.1 g/dL (ref 12.2–16.2)
Lymph, poc: 2.8 (ref 0.6–3.4)
MCH, POC: 26.3 pg — AB (ref 27–31.2)
MCHC: 31 g/dL — AB (ref 31.8–35.4)
MCV: 85 fL (ref 80–97)
MID (cbc): 0.6 (ref 0–0.9)
MPV: 8.8 fL (ref 0–99.8)
POC Granulocyte: 4.9 (ref 2–6.9)
POC LYMPH PERCENT: 33.3 % (ref 10–50)
POC MID %: 7.6 % (ref 0–12)
Platelet Count, POC: 274 10*3/uL (ref 142–424)
RBC: 4.98 M/uL (ref 4.04–5.48)
RDW, POC: 16.1 %
WBC: 8.3 10*3/uL (ref 4.6–10.2)

## 2012-09-16 LAB — BASIC METABOLIC PANEL
CO2: 27 mEq/L (ref 19–32)
Calcium: 9.2 mg/dL (ref 8.4–10.5)
Glucose, Bld: 76 mg/dL (ref 70–99)
Potassium: 4.6 mEq/L (ref 3.5–5.3)
Sodium: 139 mEq/L (ref 135–145)

## 2012-09-16 LAB — BASIC METABOLIC PANEL WITH GFR
BUN: 11 mg/dL (ref 6–23)
Chloride: 104 meq/L (ref 96–112)
Creat: 0.78 mg/dL (ref 0.50–1.10)

## 2012-09-16 LAB — URIC ACID: Uric Acid, Serum: 6.3 mg/dL (ref 2.4–7.0)

## 2012-09-16 MED ORDER — INDOMETHACIN 50 MG PO CAPS: 50.0000 mg | ORAL_CAPSULE | Freq: Two times a day (BID) | ORAL | Status: DC

## 2012-09-16 MED ORDER — KETOROLAC TROMETHAMINE 60 MG/2ML IM SOLN
60.0000 mg | Freq: Once | INTRAMUSCULAR | Status: AC
  Administered 2012-09-16: 60 mg via INTRAMUSCULAR

## 2012-09-16 NOTE — Progress Notes (Signed)
[facility]:  Office Visit  Chief Complaint:  Chief Complaint  Patient presents with  . Foot Pain    right- recheck    HPI: Mackenzie Everett is a 52 y.o. female who complains of  Left lateral dorsal foot pain here for recheck after bing on steroids, she denies any injury. She has had swelling. No history of osteopenia, osteoporosis. She has a twin sister with  Lupus. She tried her pain pills without releif, soaks. Throbbing, aching constant 5/10 pain.  spontaneous gradual increasing pain lateral dorsal left foot with no h/o trauma or new activity. This morning the foot became too painful to wear shoes or even touch, and the pain is precipitated by any movement of foot or lateral toes. Works at [employer]   Past Medical History  Diagnosis Date  . Allergy   . Arthritis   . Asthma   . Hyperlipidemia 09/04/2011   Past Surgical History  Procedure Laterality Date  . Appendectomy    . Breast surgery    . Tubal ligation     History   Social History  . Marital Status: Single    Spouse Name: N/A    Number of Children: N/A  . Years of Education: N/A   Social History Main Topics  . Smoking status: Never Smoker   . Smokeless tobacco: None  . Alcohol Use: No     Comment: social  . Drug Use: No  . Sexually Active: No   Other Topics Concern  . None   Social History Narrative  . None   Family History  Problem Relation Age of Onset  . Arthritis Mother   . Diabetes Mother   . Hyperlipidemia Mother   . Hypertension Mother   . Hyperlipidemia Sister   . Hypertension Sister   . Lupus Sister   . Cancer Maternal Aunt   . Diabetes Maternal Grandmother   . Heart disease Maternal Grandfather   . Heart disease Paternal Grandfather    Allergies  Allergen Reactions  . Caffeine Other (See Comments)    migraines  . Penicillins Hives   Prior to Admission medications   Medication Sig Start Date End Date Taking? Authorizing Provider  HYDROcodone-acetaminophen  (VICODIN) 5-500 MG per tablet Use as needed for severe pain of back or headache 08/13/12  Yes [name], MD  rizatriptan (MAXALT) 10 MG tablet Take 1 tablet (10 mg total) by mouth as needed for migraine. May repeat in 2 hours if needed 08/13/12 08/13/13 Yes [name], MD  tiZANidine (ZANAFLEX) 4 MG tablet One every 6-8 hours for muscle relaxant and migraine as needed 08/13/12  Yes [name], MD     ROS: The patient denies fevers, chills, night sweats, unintentional weight loss, chest pain, palpitations, wheezing, dyspnea on exertion, nausea, vomiting, abdominal pain, dysuria, hematuria, melena, numbness, weakness, or tingling.  All other systems have been reviewed and were otherwise negative with the exception of those mentioned in the HPI and as above.    PHYSICAL EXAM: Filed Vitals:   09/16/12 1217  BP: 152/94  Pulse: 60  Temp: 97.9 F (36.6 C)  Resp: 16   Filed Vitals:   09/16/12 1217  Height: 5' 9.75" (1.772 m)  Weight: 316 lb 12.8 oz (143.7 kg)   Body mass index is 45.76 kg/(m^2).  General: Alert, ,ildacute distress HEENT:  Normocephalic, atraumatic, oropharynx patent.  Cardiovascular:  Regular rate and rhythm, no rubs murmurs or gallops.  No Carotid bruits, radial  pulse intact. No pedal edema.  Respiratory: Clear to auscultation bilaterally.  No wheezes, rales, or rhonchi.  No cyanosis, no use of accessory musculature GI: No organomegaly, abdomen is soft and non-tender, positive bowel sounds.  No masses. Skin: No rashes. Neurologic: Facial musculature symmetric. Psychiatric: Patient is appropriate throughout our interaction. Lymphatic: No cervical lymphadenopathy Musculoskeletal: Gait limp . + dorsal swelling, tender at 5th MTP 5/5 sterngth Good post tib artery   LABS:        EKG/XRAY:   Primary read interpreted by Dr. Conley Rolls at [facility]. No fx/dislocation + DJD   ASSESSMENT/PLAN: Encounter Diagnoses  Name Primary?  . Pain in joint, ankle and foot, left  Yes  . Swelling of foot joint, right    Will call me in 1 week to give update Toradol today Indomethacin start tomorrow Pot op shoe given Labs pending: cbc, bmp, uric acid, esr F/u prn     [name], DO 09/16/2012 1:07 PM

## 2012-09-21 ENCOUNTER — Telehealth: Payer: Self-pay

## 2012-09-21 NOTE — Telephone Encounter (Signed)
Patient says phone is not working and would like someone to call her at work tomorrow (tuesday) from 8-5 at [phone] with her lab results.

## 2012-09-22 ENCOUNTER — Telehealth: Payer: Self-pay | Admitting: Family Medicine

## 2012-09-22 LAB — ANA: Anti Nuclear Antibody(ANA): NEGATIVE

## 2012-09-22 NOTE — Telephone Encounter (Signed)
noted 

## 2012-09-22 NOTE — Telephone Encounter (Signed)
Unable to leave message, will call back .

## 2012-10-12 ENCOUNTER — Ambulatory Visit: Payer: BC Managed Care – PPO

## 2012-10-12 ENCOUNTER — Ambulatory Visit (INDEPENDENT_AMBULATORY_CARE_PROVIDER_SITE_OTHER): Payer: BC Managed Care – PPO | Admitting: Family Medicine

## 2012-10-12 VITALS — BP 132/84 | HR 71 | Temp 98.0°F | Resp 17 | Ht 70.0 in | Wt 317.0 lb

## 2012-10-12 DIAGNOSIS — M1990 Unspecified osteoarthritis, unspecified site: Secondary | ICD-10-CM

## 2012-10-12 DIAGNOSIS — M79671 Pain in right foot: Secondary | ICD-10-CM

## 2012-10-12 DIAGNOSIS — M79609 Pain in unspecified limb: Secondary | ICD-10-CM

## 2012-10-12 DIAGNOSIS — M129 Arthropathy, unspecified: Secondary | ICD-10-CM

## 2012-10-12 DIAGNOSIS — M25473 Effusion, unspecified ankle: Secondary | ICD-10-CM

## 2012-10-12 DIAGNOSIS — M25474 Effusion, right foot: Secondary | ICD-10-CM

## 2012-10-12 LAB — POCT SEDIMENTATION RATE: POCT SED RATE: 44 mm/hr — AB (ref 0–22)

## 2012-10-12 MED ORDER — MELOXICAM 15 MG PO TABS: 7.5000 mg | ORAL_TABLET | Freq: Two times a day (BID) | ORAL | Status: DC

## 2012-10-12 MED ORDER — MELOXICAM 7.5 MG PO TABS: 7.5000 mg | ORAL_TABLET | Freq: Two times a day (BID) | ORAL | Status: DC

## 2012-10-12 NOTE — Progress Notes (Signed)
[facility]:  Office Visit  Chief Complaint:  Chief Complaint  Patient presents with  . Follow-up    bilateral foot pain     HPI: Mackenzie Everett is a 52 y.o. female who complains of  Constant throbbing aching 5-9/10 Right foot pain x 1 week on lateral right side and also at the heel. Worse with ROM and movement. She continues to have left foot pain.  She was seen here for right foot pain on 09/16/12. Labs were done and sed rate was 39 and uric acid was normal, she has a twin sister with Lupus so I did an ANA lab test and that was normal. I told her to change her footwear and see if the swelling goes down. She works in Corporate treasurer at [employer]. During the summer she works part time and is not on her feet as much.Pior to 6/18 she was seen by Dr. Milus Glazier on 09/08/12 and was dx with tenodonitis. She tried steroids with minimal relief. I gave her vicodin with minimal relief. She denies PF, knee or hip pain. No prior foot or ankle injuries/surgeries. She does not have any pain laong the plantar aspect of her foot. She has pain at heel spur and throbbing on lateral side. Her left foot pain is the same as last time, she has been wearing her post op shoe since the swelling has either stayed the same or no better and she can;t fit into here normal shoes. Denies w/n/t.      Past Medical History  Diagnosis Date  . Allergy   . Arthritis   . Asthma   . Hyperlipidemia 09/04/2011   Past Surgical History  Procedure Laterality Date  . Appendectomy    . Breast surgery    . Tubal ligation     History   Social History  . Marital Status: Single    Spouse Name: N/A    Number of Children: N/A  . Years of Education: N/A   Social History Main Topics  . Smoking status: Never Smoker   . Smokeless tobacco: None  . Alcohol Use: No     Comment: social  . Drug Use: No  . Sexually Active: No   Other Topics Concern  . None   Social History Narrative  . None   Family History   Problem Relation Age of Onset  . Arthritis Mother   . Diabetes Mother   . Hyperlipidemia Mother   . Hypertension Mother   . Hyperlipidemia Sister   . Hypertension Sister   . Lupus Sister   . Cancer Maternal Aunt   . Diabetes Maternal Grandmother   . Heart disease Maternal Grandfather   . Heart disease Paternal Grandfather    Allergies  Allergen Reactions  . Caffeine Other (See Comments)    migraines  . Penicillins Hives   Prior to Admission medications   Medication Sig Start Date End Date Taking? Authorizing Provider  HYDROcodone-acetaminophen (VICODIN) 5-500 MG per tablet Use as needed for severe pain of back or headache 08/13/12  Yes [name], MD  rizatriptan (MAXALT) 10 MG tablet Take 1 tablet (10 mg total) by mouth as needed for migraine. May repeat in 2 hours if needed 08/13/12 08/13/13 Yes [name], MD  tiZANidine (ZANAFLEX) 4 MG tablet One every 6-8 hours for muscle relaxant and migraine as needed 08/13/12  Yes [name], MD     ROS: The patient denies fevers, chills, night sweats, unintentional weight loss,  chest pain, palpitations, wheezing, dyspnea on exertion, nausea, vomiting, abdominal pain, dysuria, hematuria, melena, numbness, weakness, or tingling.   All other systems have been reviewed and were otherwise negative with the exception of those mentioned in the HPI and as above.    PHYSICAL EXAM: Filed Vitals:   10/12/12 1433  BP: 132/84  Pulse: 71  Temp: 98 F (36.7 C)  Resp: 17   Filed Vitals:   10/12/12 1433  Height: 5\' 10"  (1.778 m)  Weight: 317 lb (143.79 kg)   Body mass index is 45.48 kg/(m^2).  General: Alert, no acute distress HEENT:  Normocephalic, atraumatic, oropharynx patent.  Cardiovascular:  Regular rate and rhythm, no rubs murmurs or gallops.  No Carotid bruits, radial pulse intact. + mod pedal edema.  Respiratory: Clear to auscultation bilaterally.  No wheezes, rales, or rhonchi.  No cyanosis, no use of accessory  musculature GI: No organomegaly, abdomen is soft and non-tender, positive bowel sounds.  No masses. Skin: No rashes. Neurologic: Facial musculature symmetric. Psychiatric: Patient is appropriate throughout our interaction. Lymphatic: No cervical lymphadenopathy Musculoskeletal: Gait limp. Right foot-no deformities. Tender latera aspect of toe along 5th MTP to base of lateral malleolus, full ROM, no crepitus, anterior drawer negaitve, + posterior tib artery pulse, good cap refill, + edema nonpitting   LABS: Results for orders placed in visit on 09/16/12  URIC ACID      Result Value Range   Uric Acid, Serum 6.3  2.4 - 7.0 mg/dL  BASIC METABOLIC PANEL      Result Value Range   Sodium 139  135 - 145 mEq/L   Potassium 4.6  3.5 - 5.3 mEq/L   Chloride 104  96 - 112 mEq/L   CO2 27  19 - 32 mEq/L   Glucose, Bld 76  70 - 99 mg/dL   BUN 11  6 - 23 mg/dL   Creat 9.60  4.54 - 0.98 mg/dL   Calcium 9.2  8.4 - 11.9 mg/dL  POCT CBC      Result Value Range   WBC 8.3  4.6 - 10.2 K/uL   Lymph, poc 2.8  0.6 - 3.4   POC LYMPH PERCENT 33.3  10 - 50 %L   MID (cbc) 0.6  0 - 0.9   POC MID % 7.6  0 - 12 %M   POC Granulocyte 4.9  2 - 6.9   Granulocyte percent 59.1  37 - 80 %G   RBC 4.98  4.04 - 5.48 M/uL   Hemoglobin 13.1  12.2 - 16.2 g/dL   HCT, POC 14.7  82.9 - 47.9 %   MCV 85.0  80 - 97 fL   MCH, POC 26.3 (*) 27 - 31.2 pg   MCHC 31.0 (*) 31.8 - 35.4 g/dL   RDW, POC 56.2     Platelet Count, POC 274  142 - 424 K/uL   MPV 8.8  0 - 99.8 fL  POCT SEDIMENTATION RATE      Result Value Range   POCT SED RATE 39 (*) 0 - 22 mm/hr     EKG/XRAY:   Primary read interpreted by Dr. Conley Rolls at [facility]. No fx/dislocation + heel spur, + DJD   ASSESSMENT/PLAN: Encounter Diagnoses  Name Primary?  . Right foot pain Yes  . Arthritis   . Swelling of foot joint, right    Rx Mobic trila while waiting for rheum referrral Post op shoe, this may help with heel pain isnce her gait was off wearing just one post  op  shoe on left foot before RF, repeat ESR Refer to rheumatology due to bilateral foot pain with swelling,sister with dx of Lupus F/u in 2 weeks by phone     LE, THAO PHUONG, DO 10/12/2012 3:58 PM

## 2012-10-13 LAB — RHEUMATOID FACTOR: Rheumatoid fact SerPl-aCnc: 11 [IU]/mL (ref <=14)

## 2012-10-23 ENCOUNTER — Ambulatory Visit (INDEPENDENT_AMBULATORY_CARE_PROVIDER_SITE_OTHER): Payer: BC Managed Care – PPO | Admitting: Physician Assistant

## 2012-10-23 VITALS — BP 136/74 | HR 63 | Temp 97.8°F | Resp 18 | Ht 70.0 in | Wt 315.0 lb

## 2012-10-23 DIAGNOSIS — R21 Rash and other nonspecific skin eruption: Secondary | ICD-10-CM

## 2012-10-23 DIAGNOSIS — L282 Other prurigo: Secondary | ICD-10-CM

## 2012-10-23 MED ORDER — PREDNISONE 20 MG PO TABS: ORAL_TABLET | ORAL | Status: DC

## 2012-10-23 NOTE — Patient Instructions (Signed)
Start prednisone taper Continue Benadryl 25-50 mg at bedtime Start Zyrtec (cetirizine) daily in the morning

## 2012-10-23 NOTE — Progress Notes (Signed)
  Subjective:    Patient ID: Mackenzie Everett, female    DOB: Jan 19, 1961, 52 y.o.   MRN: [id number]  HPI 52 year old female presents with acute onset of pruritic rash.  States symptoms started yesterday morning and have progressively worsened. Admits it started with just 1 erythematous dot on her left wrist and has since spread over her entire body and has become incredibly pruritic.  Rash is diffusely present over bilateral arms, legs, and torso.  Spares her head and neck.  No recent exposures to poison ivy or other plants.  Denies any new foods. Did start Mobic 3 days ago, although does admit she has taken this in the past for back pain without any problems.  Also spent the night before the rash started at her brother-in-law's house. Found 1 small bug in the sheets but does not think its bed bugs.  Nobody else in that house has similar symptoms.  Does have hx of allergies to detergents and is not sure what was used on the sheets while she was staying there.  Has taken 1 dose of Benadryl which did not seem to help her itching. Denies SOB, trouble breathing, lip/tongue swelling.  Patient is otherwise doing well with no other concerns today.     Review of Systems  Constitutional: Negative for fever and chills.  Gastrointestinal: Negative for nausea and vomiting.  Skin: Positive for rash.  Neurological: Negative for headaches.       Objective:   Physical Exam  Constitutional: She is oriented to person, place, and time. She appears well-developed and well-nourished.  HENT:  Head: Normocephalic and atraumatic.  Right Ear: External ear normal.  Left Ear: External ear normal.  Eyes: Conjunctivae are normal.  Neck: Normal range of motion.  Cardiovascular: Normal rate.   Pulmonary/Chest: Effort normal.  Neurological: She is alert and oriented to person, place, and time.  Skin:  Diffuse, erythematous macules over arms, legs and torso. Left dorsal aspect of foot has linear erythematous lesion.  No  vesicles, drainage, or induration.   Psychiatric: She has a normal mood and affect. Her behavior is normal. Judgment and thought content normal.          Assessment & Plan:  Pruritic rash - Plan: predniSONE (DELTASONE) 20 MG tablet  Rash and nonspecific skin eruption  Unsure of etiology - likely contact although cannot rule out drug reaction to Mobic Stop Mobic until f/u with Rheumatology Start Zyrtec daily in the morning. Continue Benadryl at bedtime Prednisone taper.  Follow up or go to ER if symptoms worsen or fail to improve. To ED if SOB, trouble breathing or lip/tongue swelling develops.

## 2012-10-30 ENCOUNTER — Telehealth: Payer: Self-pay

## 2012-10-30 MED ORDER — HYDROXYZINE HCL 25 MG PO TABS: 25.0000 mg | ORAL_TABLET | Freq: Every evening | ORAL | Status: DC | PRN

## 2012-10-30 NOTE — Telephone Encounter (Signed)
Rash began to dry up but now the rash is back in the same place. Best# [phone]

## 2012-10-30 NOTE — Telephone Encounter (Signed)
Did she stop the Mobic? Has she been taking any other new medications? I will send and rx for vistaril for her to take at bedtime in place of the Benadryl and she needs to continue Zyrtec. If this recurs she may need to come back in unfortunately

## 2012-10-30 NOTE — Telephone Encounter (Signed)
Patient Instructions    Start prednisone taper  Continue Benadryl 25-50 mg at bedtime  Start Zyrtec (cetirizine) daily in the morning   Please advise. Called to make sure she is still taking the Zyrtec and Benadryl. She is on last day of the prednisone.

## 2012-10-30 NOTE — Telephone Encounter (Signed)
Yes, she has stopped mobic. She will try meds as directed and come back in if not improving or if getting worse

## 2012-11-15 ENCOUNTER — Telehealth: Payer: Self-pay

## 2012-11-15 DIAGNOSIS — G43809 Other migraine, not intractable, without status migrainosus: Secondary | ICD-10-CM

## 2012-11-15 MED ORDER — RIZATRIPTAN BENZOATE 10 MG PO TABS: 10.0000 mg | ORAL_TABLET | ORAL | Status: DC | PRN

## 2012-11-15 NOTE — Telephone Encounter (Signed)
Patient would like refill of Maxalt.

## 2012-11-15 NOTE — Telephone Encounter (Signed)
Sent!

## 2012-12-14 ENCOUNTER — Telehealth: Payer: Self-pay

## 2012-12-14 NOTE — Telephone Encounter (Signed)
Have given request to xray

## 2012-12-14 NOTE — Telephone Encounter (Signed)
PT HAVE AN ORTH APPT ON Thursday AND WOULD LIKE TO COME BY AND P/U HER XRAYS. PLEASE CALL [phone]

## 2013-01-29 ENCOUNTER — Telehealth: Payer: Self-pay

## 2013-01-29 DIAGNOSIS — G43809 Other migraine, not intractable, without status migrainosus: Secondary | ICD-10-CM

## 2013-01-29 MED ORDER — RIZATRIPTAN BENZOATE 10 MG PO TABS: 10.0000 mg | ORAL_TABLET | ORAL | Status: DC | PRN

## 2013-01-29 NOTE — Telephone Encounter (Signed)
Pt would like a refill on her rx maxalt.  Pharmacy: [facility] on [address] [phone]

## 2013-01-29 NOTE — Telephone Encounter (Signed)
Rx sent to pharmacy   

## 2013-01-30 NOTE — Telephone Encounter (Signed)
Pt notified that rx was at pharmacy

## 2013-02-01 ENCOUNTER — Other Ambulatory Visit: Payer: Self-pay | Admitting: Family Medicine

## 2013-02-01 ENCOUNTER — Telehealth: Payer: Self-pay

## 2013-02-01 DIAGNOSIS — N6310 Unspecified lump in the right breast, unspecified quadrant: Secondary | ICD-10-CM

## 2013-02-01 NOTE — Telephone Encounter (Signed)
Pt is needing to talk with someone about a mammogram order with the breast center   Best number [phone]

## 2013-02-02 NOTE — Telephone Encounter (Signed)
Patient called to schedule her mammogram, she has found a small lump on right breast at axillary area, wants to know if we can order the mammogram without her coming in here. Pended order (she will need diagnostic), also she will need an U/S pended this also.

## 2013-02-09 NOTE — Telephone Encounter (Signed)
I have signed pended orders for breast US and MMG.

## 2013-03-03 ENCOUNTER — Ambulatory Visit
Admission: RE | Admit: 2013-03-03 | Discharge: 2013-03-03 | Disposition: A | Discharge status: Home / Self Care | Payer: BC Managed Care – PPO | Source: Ambulatory Visit | Attending: Family Medicine | Admitting: Family Medicine

## 2013-03-03 DIAGNOSIS — N6310 Unspecified lump in the right breast, unspecified quadrant: Secondary | ICD-10-CM

## 2013-04-30 ENCOUNTER — Ambulatory Visit (INDEPENDENT_AMBULATORY_CARE_PROVIDER_SITE_OTHER): Payer: BC Managed Care – PPO | Admitting: Family Medicine

## 2013-04-30 VITALS — BP 150/80 | HR 76 | Temp 98.6°F | Resp 16 | Ht 70.0 in | Wt 303.0 lb

## 2013-04-30 DIAGNOSIS — K921 Melena: Secondary | ICD-10-CM

## 2013-04-30 DIAGNOSIS — N898 Other specified noninflammatory disorders of vagina: Secondary | ICD-10-CM

## 2013-04-30 DIAGNOSIS — R300 Dysuria: Secondary | ICD-10-CM

## 2013-04-30 DIAGNOSIS — Z113 Encounter for screening for infections with a predominantly sexual mode of transmission: Secondary | ICD-10-CM

## 2013-04-30 DIAGNOSIS — N390 Urinary tract infection, site not specified: Secondary | ICD-10-CM

## 2013-04-30 DIAGNOSIS — A599 Trichomoniasis, unspecified: Secondary | ICD-10-CM

## 2013-04-30 LAB — POCT URINALYSIS DIPSTICK
Bilirubin, UA: NEGATIVE
GLUCOSE UA: NEGATIVE
Ketones, UA: NEGATIVE
NITRITE UA: NEGATIVE
Spec Grav, UA: >=1.03
UROBILINOGEN UA: 0.2
pH, UA: 5.5

## 2013-04-30 LAB — HIV ANTIBODY (ROUTINE TESTING W REFLEX): HIV: NONREACTIVE

## 2013-04-30 LAB — POCT UA - MICROSCOPIC ONLY
CASTS, UR, LPF, POC: NEGATIVE
CRYSTALS, UR, HPF, POC: NEGATIVE
Mucus, UA: POSITIVE
TRICHOMONAS UA: POSITIVE
YEAST UA: NEGATIVE

## 2013-04-30 LAB — RPR

## 2013-04-30 LAB — POCT WET PREP WITH KOH
KOH Prep POC: NEGATIVE
Trichomonas, UA: POSITIVE
Yeast Wet Prep HPF POC: NEGATIVE

## 2013-04-30 LAB — IFOBT (OCCULT BLOOD): IFOBT: NEGATIVE

## 2013-04-30 MED ORDER — METRONIDAZOLE 500 MG PO TABS: 500.0000 mg | ORAL_TABLET | Freq: Once | ORAL | Status: DC

## 2013-04-30 MED ORDER — CIPROFLOXACIN HCL 500 MG PO TABS: 500.0000 mg | ORAL_TABLET | Freq: Two times a day (BID) | ORAL | Status: DC

## 2013-04-30 NOTE — Patient Instructions (Signed)
Trichomoniasis °Trichomoniasis is an infection, caused by the Trichomonas organism, that affects both women and men. In women, the outer female genitalia and the vagina are affected. In men, the penis is mainly affected, but the prostate and other reproductive organs can also be involved. Trichomoniasis is a sexually transmitted disease (STD) and is most often passed to another person through sexual contact. The majority of people who get trichomoniasis do so from a sexual encounter and are also at risk for other STDs. °CAUSES  °· Sexual intercourse with an infected partner. °· It can be present in swimming pools or hot tubs. °SYMPTOMS  °· Abnormal gray-green frothy vaginal discharge in women. °· Vaginal itching and irritation in women. °· Itching and irritation of the area outside the vagina in women. °· Penile discharge with or without pain in males. °· Inflammation of the urethra (urethritis), causing painful urination. °· Bleeding after sexual intercourse. °RELATED COMPLICATIONS °· Pelvic inflammatory disease. °· Infection of the uterus (endometritis). °· Infertility. °· Tubal (ectopic) pregnancy. °· It can be associated with other STDs, including gonorrhea and chlamydia, hepatitis B, and HIV. °COMPLICATIONS DURING PREGNANCY °· Early (premature) delivery. °· Premature rupture of the membranes (PROM). °· Low birth weight. °DIAGNOSIS  °· Visualization of Trichomonas under the microscope from the vagina discharge. °· Ph of the vagina greater than 4.5, tested with a test tape. °· Trich Rapid Test. °· Culture of the organism, but this is not usually needed. °· It may be found on a Pap test. °· Having a "strawberry cervix,"which means the cervix looks very red like a strawberry. °TREATMENT  °· You may be given medication to fight the infection. Inform your caregiver if you could be or are pregnant. Some medications used to treat the infection should not be taken during pregnancy. °· Over-the-counter medications or  creams to decrease itching or irritation may be recommended. °· Your sexual partner will need to be treated if infected. °HOME CARE INSTRUCTIONS  °· Take all medication prescribed by your caregiver. °· Take over-the-counter medication for itching or irritation as directed by your caregiver. °· Do not have sexual intercourse while you have the infection. °· Do not douche or wear tampons. °· Discuss your infection with your partner, as your partner may have acquired the infection from you. Or, your partner may have been the person who transmitted the infection to you. °· Have your sex partner examined and treated if necessary. °· Practice safe, informed, and protected sex. °· See your caregiver for other STD testing. °SEEK MEDICAL CARE IF:  °· You still have symptoms after you finish the medication. °· You have an oral temperature above 102° F (38.9° C). °· You develop belly (abdominal) pain. °· You have pain when you urinate. °· You have bleeding after sexual intercourse. °· You develop a rash. °· The medication makes you sick or makes you throw up (vomit). °Document Released: 09/11/2000 Document Revised: 06/10/2011 Document Reviewed: 10/07/2008 °[employer]® Patient Information ©2014 [employer]. ° °

## 2013-04-30 NOTE — Progress Notes (Signed)
Chief Complaint:  Chief Complaint  Patient presents with  . Dysuria    x 1 week  . Vaginal Discharge    HPI: Mackenzie Everett is a 53 y.o. female who is here for  Dysuria x 1 week. No fevers or chills, abd pain or back pain.  No prior h/o of UTI. Denies hematuria. She sometimes has rectal brleeding and the bright red blood is only on the tissue paper when she wipes, this is related to when she has to strain.  She has Yellow vaginal dc and has odor to it. She denies itching. She has been sexually active but not anymore, she denies having any prior STDs.  Pap exam was done 2 years ago , no prior abnormal paps. Neg for itching or burning or rahs on vaginal areas.  Has never had a colonscopy, she is still trying to get her son or someone to give her a ride, she will let us know when she is ready for colonoscopy.  Past Medical History  Diagnosis Date  . Allergy   . Arthritis   . Asthma   . Hyperlipidemia 09/04/2011   Past Surgical History  Procedure Laterality Date  . Appendectomy    . Breast surgery    . Tubal ligation     History   Social History  . Marital Status: Single    Spouse Name: N/A    Number of Children: N/A  . Years of Education: N/A   Social History Main Topics  . Smoking status: Never Smoker   . Smokeless tobacco: None  . Alcohol Use: No     Comment: social  . Drug Use: No  . Sexual Activity: No   Other Topics Concern  . None   Social History Narrative  . None   Family History  Problem Relation Age of Onset  . Arthritis Mother   . Diabetes Mother   . Hyperlipidemia Mother   . Hypertension Mother   . Hyperlipidemia Sister   . Hypertension Sister   . Lupus Sister   . Cancer Maternal Aunt   . Diabetes Maternal Grandmother   . Heart disease Maternal Grandfather   . Heart disease Paternal Grandfather    Allergies  Allergen Reactions  . Caffeine Other (See Comments)    migraines  . Penicillins Hives   Prior to Admission medications     Medication Sig Start Date End Date Taking? Authorizing Provider  cyclobenzaprine (FLEXERIL) 10 MG tablet Take 10 mg by mouth 3 (three) times daily as needed for muscle spasms.   Yes Historical Provider, MD  tiZANidine (ZANAFLEX) 4 MG tablet One every 6-8 hours for muscle relaxant and migraine as needed 08/13/12  Yes [name], MD  HYDROcodone-acetaminophen (VICODIN) 5-500 MG per tablet Use as needed for severe pain of back or headache 08/13/12   [name], MD  hydrOXYzine (ATARAX/VISTARIL) 25 MG tablet Take 1 tablet (25 mg total) by mouth at bedtime as needed for itching. 10/30/12   Heather Jaquita Rector, PA-C  meloxicam (MOBIC) 7.5 MG tablet Take 1 tablet (7.5 mg total) by mouth 2 (two) times daily. Take with Food. No other NSAIDs. Max of 15 mg in 24 hrs 10/12/12   Yoniel Arkwright P Anarosa Kubisiak, DO  predniSONE (DELTASONE) 20 MG tablet Take 3 PO QAM x3days, 2 PO QAM x3days, 1 PO QAM x3days 10/23/12   Nelva Nay, PA-C  rizatriptan (MAXALT) 10 MG tablet Take 1 tablet (10 mg total) by mouth as  needed for migraine. May repeat in 2 hours if needed 01/29/13 01/29/14  Nelva NayHeather M Marte, PA-C     ROS: The patient denies fevers, chills, night sweats, unintentional weight loss, chest pain, palpitations, wheezing, dyspnea on exertion, nausea, vomiting, abdominal pain,, hematuria,  numbness, weakness, or tingling.  All other systems have been reviewed and were otherwise negative with the exception of those mentioned in the HPI and as above.    PHYSICAL EXAM: Filed Vitals:   04/30/13 0756  BP: 150/80  Pulse: 76  Temp: 98.6 F (37 C)  Resp: 16   Filed Vitals:   04/30/13 0756  Height: 5\' 10"  (1.778 m)  Weight: 303 lb (137.44 kg)   Body mass index is 43.48 kg/(m^2).  General: Alert, no acute distress, obese AA female HEENT:  Normocephalic, atraumatic, oropharynx patent. EOMI, PERRLA Cardiovascular:  Regular rate and rhythm, no rubs murmurs or gallops.  No Carotid bruits, radial pulse intact. No pedal edema.   Respiratory: Clear to auscultation bilaterally.  No wheezes, rales, or rhonchi.  No cyanosis, no use of accessory musculature GI: No organomegaly, abdomen is soft and non-tender, positive bowel sounds.  No masses. Skin: No rashes. Neurologic: Facial musculature symmetric. Psychiatric: Patient is appropriate throughout our interaction. Lymphatic: No cervical lymphadenopathy Musculoskeletal: Gait intact. Genital-cervical exam + yellow/clear Dc , no CMT, no rashes/lesions Rectal exam-+ external hemorrhoids. No fissures   LABS: Results for orders placed in visit on 04/30/13  URINE CULTURE      Result Value Range   Colony Count NO GROWTH     Organism ID, Bacteria NO GROWTH    GC/CHLAMYDIA PROBE AMP      Result Value Range   CT Probe RNA NEGATIVE     GC Probe RNA NEGATIVE    HIV ANTIBODY (ROUTINE TESTING)      Result Value Range   HIV NON REACTIVE  NON REACTIVE  HSV(HERPES SIMPLEX VRS) I + II AB-IGG      Result Value Range   HSV 1 Glycoprotein G Ab, IgG 8.84 (*)    HSV 2 Glycoprotein G Ab, IgG 9.81 (*)   RPR      Result Value Range   RPR NON REAC  NON REAC  POCT URINALYSIS DIPSTICK      Result Value Range   Color, UA yellow     Clarity, UA sl cloudy     Glucose, UA neg     Bilirubin, UA neg     Ketones, UA neg     Spec Grav, UA >=1.030     Blood, UA trace-lysed     pH, UA 5.5     Protein, UA >=300     Urobilinogen, UA 0.2     Nitrite, UA neg     Leukocytes, UA small (1+)    POCT UA - MICROSCOPIC ONLY      Result Value Range   WBC, Ur, HPF, POC 10-15     RBC, urine, microscopic 0-1     Bacteria, U Microscopic 1+     Mucus, UA positive     Epithelial cells, urine per micros 3-5     Crystals, Ur, HPF, POC neg     Casts, Ur, LPF, POC neg     Yeast, UA neg     Trichomonas, UA Positive    IFOBT (OCCULT BLOOD)      Result Value Range   IFOBT Negative    POCT WET PREP WITH KOH      Result Value Range  Trichomonas, UA Positive     Clue Cells Wet Prep HPF POC 3-4      Epithelial Wet Prep HPF POC 3-6     Yeast Wet Prep HPF POC neg     Bacteria Wet Prep HPF POC 3+     RBC Wet Prep HPF POC 3-5     WBC Wet Prep HPF POC 25-30     KOH Prep POC Negative       EKG/XRAY:   Primary read interpreted by Dr. Conley Rolls at [facility].   ASSESSMENT/PLAN: Encounter Diagnoses  Name Primary?  . Dysuria Yes  . Vaginal discharge   . Blood in stool   . Screening for STD (sexually transmitted disease)   . Trichimoniasis   . UTI (urinary tract infection)    Practice safe sex.  STD labs pending, urine cx pending Rx Flagyl 2 gram x 1 dose Advise to get colonscopy, I will try to refer her and if she wants it then she can accept F/u prn   Gross sideeffects, risk and benefits, and alternatives of medications d/w patient. Patient is aware that all medications have potential sideeffects and we are unable to predict every sideeffect or drug-drug interaction that may occur.  Raiana Pharris PHUONG, DO 05/04/2013 10:59 AM   Spoke to patient about labs. Tle 05/04/13

## 2013-05-01 LAB — GC/CHLAMYDIA PROBE AMP
CT Probe RNA: NEGATIVE
GC Probe RNA: NEGATIVE

## 2013-05-01 LAB — URINE CULTURE
Colony Count: NO GROWTH
Organism ID, Bacteria: NO GROWTH

## 2013-05-03 LAB — HSV(HERPES SIMPLEX VRS) I + II AB-IGG
HSV 1 Glycoprotein G Ab, IgG: 8.84 IV — ABNORMAL HIGH
HSV 2 Glycoprotein G Ab, IgG: 9.81 IV — ABNORMAL HIGH

## 2013-05-04 ENCOUNTER — Telehealth: Payer: Self-pay | Admitting: Family Medicine

## 2013-05-04 NOTE — Telephone Encounter (Signed)
Spoke to patient about all her STD and urine cx esults. She ahs ahd 3 days of cipro, can stop since urine cx was negative. She called ex boyfriedn and told him that he needs to get checked out for trich.

## 2013-07-14 ENCOUNTER — Ambulatory Visit (INDEPENDENT_AMBULATORY_CARE_PROVIDER_SITE_OTHER): Payer: BC Managed Care – PPO | Admitting: Physician Assistant

## 2013-07-14 ENCOUNTER — Ambulatory Visit: Payer: BC Managed Care – PPO

## 2013-07-14 VITALS — BP 154/82 | HR 61 | Temp 97.8°F | Resp 18 | Ht 70.5 in | Wt 315.0 lb

## 2013-07-14 DIAGNOSIS — M79605 Pain in left leg: Secondary | ICD-10-CM

## 2013-07-14 DIAGNOSIS — M545 Low back pain, unspecified: Secondary | ICD-10-CM

## 2013-07-14 MED ORDER — PREDNISONE 20 MG PO TABS: ORAL_TABLET | ORAL | Status: DC

## 2013-07-14 NOTE — Progress Notes (Signed)
I have examined this patient along with the student and agree.  

## 2013-07-14 NOTE — Patient Instructions (Signed)
You probably have a pinched nerve in your back causing your symptoms.  We are prescribing you a steriod to reduce the inflammation causing your pain.  Make sure you take this medication in the morning and with food.  If symptoms remain the same or worsen, you may need to see a specialist and/or have another imaging study done on your back.

## 2013-07-14 NOTE — Progress Notes (Signed)
Subjective:    Patient ID: Mackenzie DukesKaren R Everett, female    DOB: Nov 30, 1960, 53 y.o.   MRN: [id number]  Back Pain Pertinent negatives include no fever.    53y.o obese female with hx of arthritis, hyperlipidemia presents with lower back pain starting 7 days ago and worsening within the last 2 days.  Pt describes pain to touch on midline of lumbar spine as well as a tingling, cramping pain felt in L hip down to mid lower leg.  She was without pain before being in car for 4 hours 7 days ago.  When she got out of the car she felt the discomfort in her L hip and L leg while walking.  For past 4 days, she cannot stand for more than 3-5 minutes without symptoms becoming unbearable.  She has tried ice, heat, vicodin, and flexeril without much relief.  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  She lives alone and is also worried about falling going up/down stairs and getting into and out of shower.  2013 Xray of lumbar spine showing mild degenerative changes.  No personal hx of cancer, no new weakness, no IV drug use, no saddle anesthesia, no urinary incontinence or bowel incontinence.  Denies L calf pain or swelling, hx of blood clot, SOB, chest pain.   Review of Systems  Constitutional: Negative for fever, appetite change, fatigue and unexpected weight change.  HENT: Negative.   Eyes: Negative.   Respiratory: Negative.   Cardiovascular: Negative.   Gastrointestinal: Negative.   Endocrine: Negative.   Genitourinary: Negative.   Musculoskeletal: Positive for back pain. Negative for joint swelling.  Skin: Negative for color change and rash.  Allergic/Immunologic: Negative.   Neurological: Negative.   Hematological: Negative.   Psychiatric/Behavioral: Negative.        Objective:   Physical Exam  Constitutional: She is oriented to person, place, and time. She appears well-developed and well-nourished. No distress.  HENT:  Head: Normocephalic.  Eyes: Conjunctivae are normal. Pupils are  equal, round, and reactive to light.  Cardiovascular: Normal rate, regular rhythm, normal heart sounds and intact distal pulses.  Exam reveals no gallop and no friction rub.   No murmur heard. Pulmonary/Chest: Effort normal and breath sounds normal. No respiratory distress. She has no wheezes. She exhibits no tenderness.  Musculoskeletal:       Lumbar back: She exhibits tenderness. She exhibits no spasm.       Back:  Tenderness to palpation midline of lumbar spine and superior to L buttock.  LE sensation symmetrically intact.  LE hip flexion, knee ext/flx, EHL symmetrical and without pain.  Gait normal  Neurological: She is alert and oriented to person, place, and time. She has normal strength. No sensory deficit.  Skin: Skin is warm and dry. No rash noted.  Psychiatric: She has a normal mood and affect. Her behavior is normal.   Filed Vitals:   07/14/13 1520  BP: 154/82  Pulse: 61  Temp: 97.8 F (36.6 C)  Resp: 18     Lumbar Spine [facility] reading (PRIMARY) by  Dr. Patsy Lageropland. Significant degenerative changes noted at L4-5, but not substantially worse than 12/2011.       Assessment & Plan:   1. LBP radiating to left leg Probable sciatica.  Xray showing significant degenerative changes L4-5 similar to 2013 image.  If symptoms persist or get worse, will need to see Orthopedic specialist and/or have MRI. - DG Lumbar Spine Complete; Future - predniSONE (DELTASONE) 20 MG  tablet; Take 3 PO QAM x3days, 2 PO QAM x3days, 1 PO QAM x3days  Dispense: 18 tablet; Refill: 0

## 2013-07-22 ENCOUNTER — Encounter: Payer: Self-pay | Admitting: Physician Assistant

## 2013-07-26 ENCOUNTER — Telehealth: Payer: Self-pay

## 2013-07-26 DIAGNOSIS — M5410 Radiculopathy, site unspecified: Secondary | ICD-10-CM

## 2013-07-26 NOTE — Telephone Encounter (Signed)
PT STATES SHE WAS SEEN FOR LBP BUT HER BACK AND LEG IS STILL REALLY PAINFUL AND DIDN'T KNOW THE NEXT STEP TO TAKE PLEASE CALL [phone]

## 2013-07-27 NOTE — Telephone Encounter (Signed)
Spoke with patient. She did finish her prednisone.  Is having heaviness in her legs that make it difficult to do everyday tasks like showering or walking up stairs.  Explained that degenerative changes in the low back may cause some encroachment on the nerves that exit the back and go down the legs.  Told her the next step is to do an MRI and asked if it is okay to make that referral.  She stated that would be fine.  Told her we would call her back with an appointment time and date.  She said that would be fine.    Should MRI be with or without contrast?

## 2013-07-27 NOTE — Telephone Encounter (Signed)
As per last visit notes, if her symptoms persist after treatment with prednisone, she needs MRI and/or orthopedics referral.  I recommend that we start with MRI, LS spine. She has known degenerative changes in the low back, and while there was no change from her previous films, she may have some encroachment on the nerves that exit the back and go down the leg.

## 2013-07-27 NOTE — Telephone Encounter (Signed)
MR Lumbar WITHOUT contrast ordered.

## 2013-09-02 ENCOUNTER — Telehealth: Payer: Self-pay

## 2013-09-02 DIAGNOSIS — M545 Low back pain, unspecified: Secondary | ICD-10-CM

## 2013-09-02 DIAGNOSIS — M79605 Pain in left leg: Secondary | ICD-10-CM

## 2013-09-02 NOTE — Telephone Encounter (Signed)
Pt is not getting any better.  Referral message- [employer] denied claim due to this not being a covered benefit.  Referral to ortho?

## 2013-09-02 NOTE — Telephone Encounter (Signed)
Not sure what she means by "the way the doctor wrote it up,"  Referred to orthopedics.  If there are other issues related to her "decreasing health," she should come in to be seen.

## 2013-09-02 NOTE — Telephone Encounter (Signed)
We referred the patient to have a MRI, but due to the way "the doctor wrote it up" the insurance company denied it.  She was told that it was going to be changed and resubmitted.  Meanwhile, her back is getting much worse.  Please call to discuss this and her "decreasing health."  [phone]

## 2013-09-04 NOTE — Telephone Encounter (Signed)
Pt.notified

## 2013-12-14 ENCOUNTER — Ambulatory Visit (INDEPENDENT_AMBULATORY_CARE_PROVIDER_SITE_OTHER): Payer: BC Managed Care – PPO | Admitting: Family Medicine

## 2013-12-14 VITALS — BP 134/82 | HR 93 | Temp 98.0°F | Resp 17 | Ht 69.5 in | Wt 316.0 lb

## 2013-12-14 DIAGNOSIS — M722 Plantar fascial fibromatosis: Secondary | ICD-10-CM

## 2013-12-14 DIAGNOSIS — M79609 Pain in unspecified limb: Secondary | ICD-10-CM

## 2013-12-14 DIAGNOSIS — M79672 Pain in left foot: Secondary | ICD-10-CM

## 2013-12-14 MED ORDER — TRAMADOL HCL 50 MG PO TABS: 50.0000 mg | ORAL_TABLET | Freq: Four times a day (QID) | ORAL | Status: DC | PRN

## 2013-12-14 NOTE — Patient Instructions (Signed)
Stretches, ice massage, ibuprofen up to  every 6 hours as needed with food, and tramadol if needed for more severe pain. If not improving in next week, can call orthopaedist for follow up. Can use walking boot you have at home temporarily if needed. Return to the clinic or go to the nearest emergency room if any of your symptoms worsen or new symptoms occur.  Plantar Fasciitis Plantar fasciitis is a common condition that causes foot pain. It is soreness (inflammation) of the band of tough fibrous tissue on the bottom of the foot that runs from the heel bone (calcaneus) to the ball of the foot. The cause of this soreness may be from excessive standing, poor fitting shoes, running on hard surfaces, being overweight, having an abnormal walk, or overuse (this is common in runners) of the painful foot or feet. It is also common in aerobic exercise dancers and ballet dancers. SYMPTOMS  Most people with plantar fasciitis complain of:  Severe pain in the morning on the bottom of their foot especially when taking the first steps out of bed. This pain recedes after a few minutes of walking.  Severe pain is experienced also during walking following a long period of inactivity.  Pain is worse when walking barefoot or up stairs DIAGNOSIS   Your caregiver will diagnose this condition by examining and feeling your foot.  Special tests such as X-rays of your foot, are usually not needed. PREVENTION   Consult a sports medicine professional before beginning a new exercise program.  Walking programs offer a good workout. With walking there is a lower chance of overuse injuries common to runners. There is less impact and less jarring of the joints.  Begin all new exercise programs slowly. If problems or pain develop, decrease the amount of time or distance until you are at a comfortable level.  Wear good shoes and replace them regularly.  Stretch your foot and the heel cords at the back of the ankle  (Achilles tendon) both before and after exercise.  Run or exercise on even surfaces that are not hard. For example, asphalt is better than pavement.  Do not run barefoot on hard surfaces.  If using a treadmill, vary the incline.  Do not continue to workout if you have foot or joint problems. Seek professional help if they do not improve. HOME CARE INSTRUCTIONS   Avoid activities that cause you pain until you recover.  Use ice or cold packs on the problem or painful areas after working out.  Only take over-the-counter or prescription medicines for pain, discomfort, or fever as directed by your caregiver.  Soft shoe inserts or athletic shoes with air or gel sole cushions may be helpful.  If problems continue or become more severe, consult a sports medicine caregiver or your own health care provider. Cortisone is a potent anti-inflammatory medication that may be injected into the painful area. You can discuss this treatment with your caregiver. MAKE SURE YOU:   Understand these instructions.  Will watch your condition.  Will get help right away if you are not doing well or get worse. Document Released: 12/11/2000 Document Revised: 06/10/2011 Document Reviewed: 02/10/2008 [facility] Patient Information 2015 [place], [place]. This information is not intended to replace advice given to you by your health care provider. Make sure you discuss any questions you have with your health care provider.

## 2013-12-14 NOTE — Progress Notes (Signed)
Subjective:    Patient ID: Mackenzie Everett, female    DOB: 07/18/1960, 53 y.o.   MRN: [id number] This chart was scribed for Shade Flood, MD by [place] Peace, ED Scribe. The patient was seen in [facility]. The patient's care was started at 6:11 PM.  HPI HPI Comments: Mackenzie Everett is a 53 y.o. female who presents to the [facility] complaining of left foot radiating pain onset 4 days ago. She reports history of plantar fascitis in right foot and tendonitis in left foot. Pt reports pain is specifically to heel of foot that is at it's worse when pt sits down for a brief period of time and then tries to get up and ambulate on it. Pt notes she has been taking Ibuprofen, 4 pills every 6 hours, and wearing orthotics in her shoes to try and address current problem but denies noticing any relief. Pt futher denies an increase in activity or injury that could be responsible. She states this feels very similar to plantar fascitis in the past but explains this current episode feels worse. Pt is overweight/obese. Pt is non-smoker.    Patient Active Problem List   Diagnosis Date Noted  . Obesity, Class III, BMI 40-49.9 (morbid obesity) 09/04/2011  . Hyperlipidemia 09/04/2011  . Migraine variant 09/04/2011  . Asthma with allergic rhinitis 09/04/2011  . Degenerative disc disease, lumbar 09/04/2011   Past Medical History  Diagnosis Date  . Allergy   . Arthritis     hands and knees  . Asthma   . Hyperlipidemia 09/04/2011   Past Surgical History  Procedure Laterality Date  . Appendectomy    . Breast surgery    . Tubal ligation    . Knee surgery      1994   Allergies  Allergen Reactions  . Caffeine Other (See Comments)    migraines  . Penicillins Hives   Prior to Admission medications   Medication Sig Start Date End Date Taking? Authorizing Provider  cyclobenzaprine (FLEXERIL) 10 MG tablet Take 10 mg by mouth 3 (three) times daily as needed for muscle spasms.    Historical Provider, MD    HYDROcodone-acetaminophen (VICODIN) 5-500 MG per tablet Use as needed for severe pain of back or headache 08/13/12   [name], MD  predniSONE (DELTASONE) 20 MG tablet Take 3 PO QAM x3days, 2 PO QAM x3days, 1 PO QAM x3days 07/14/13   Chelle S Jeffery, PA-C  rizatriptan (MAXALT) 10 MG tablet Take 1 tablet (10 mg total) by mouth as needed for migraine. May repeat in 2 hours if needed 01/29/13 01/29/14  Nelva Nay, PA-C   History   Social History  . Marital Status: Single    Spouse Name: N/A    Number of Children: N/A  . Years of Education: N/A   Occupational History  . Not on file.   Social History Main Topics  . Smoking status: Never Smoker   . Smokeless tobacco: Never Used  . Alcohol Use: Yes     Comment: social  . Drug Use: No  . Sexual Activity: No   Other Topics Concern  . Not on file   Social History Narrative   Lives at home alone     Review of Systems  Constitutional: Negative for activity change.  Musculoskeletal:       Left foot pain.   Skin: Negative for color change and rash.       Objective:   Physical Exam  Nursing note and vitals  reviewed. Constitutional: She is oriented to person, place, and time. She appears well-developed and well-nourished. No distress.  HENT:  Head: Normocephalic and atraumatic.  Eyes: Conjunctivae and EOM are normal. Pupils are equal, round, and reactive to light.  Neck: Neck supple. Carotid bruit is not present. No tracheal deviation present.  Cardiovascular: Normal rate, regular rhythm, normal heart sounds and intact distal pulses.   Pulmonary/Chest: Effort normal and breath sounds normal. No respiratory distress.  Abdominal: Soft. She exhibits no pulsatile midline mass. There is no tenderness.  Musculoskeletal: Normal range of motion. She exhibits tenderness.  Tender to plantar fascia of left foot into the base of calcaneus but not lateral calcaneus.   Dorsiflex and plantar flex left foot. Invert and evert. Ankle is  non-tender. Achilles non-tender.   Neurological: She is alert and oriented to person, place, and time.  NVI distally.   Skin: Skin is warm and dry. No rash noted. No erythema.  Skin is intact. No erythema or rash under foot.   Psychiatric: She has a normal mood and affect. Her behavior is normal.      Filed Vitals:   12/14/13 1740  BP: 134/82  Pulse: 93  Temp: 98 F (36.7 C)  TempSrc: Oral  Resp: 17  Height: 5' 9.5" (1.765 m)  Weight: 316 lb (143.337 kg)  SpO2: 98%       Assessment & Plan:   Mackenzie Everett is a 53 y.o. female Foot arch pain, left - Plan: traMADol (ULTRAM) 50 MG tablet  Plantar fasciitis of left foot - Plan: traMADol (ULTRAM) 50 MG tablet Suspected PF recurrence. Discussed stretches, ice massage, tramadol if needed - SED, but can start with ibuprofen  Q6hprn. If needed, temporary use of camwalker (has at home), then follow up with ortho if not improving. rtc precautions.   Meds ordered this encounter  Medications  . traMADol (ULTRAM) 50 MG tablet    Sig: Take 1 tablet (50 mg total) by mouth every 6 (six) hours as needed.    Dispense:  20 tablet    Refill:  0   Patient Instructions  Stretches, ice massage, ibuprofen up to  every 6 hours as needed with food, and tramadol if needed for more severe pain. If not improving in next week, can call orthopaedist for follow up. Can use walking boot you have at home temporarily if needed. Return to the clinic or go to the nearest emergency room if any of your symptoms worsen or new symptoms occur.  Plantar Fasciitis Plantar fasciitis is a common condition that causes foot pain. It is soreness (inflammation) of the band of tough fibrous tissue on the bottom of the foot that runs from the heel bone (calcaneus) to the ball of the foot. The cause of this soreness may be from excessive standing, poor fitting shoes, running on hard surfaces, being overweight, having an abnormal walk, or overuse (this is common in  runners) of the painful foot or feet. It is also common in aerobic exercise dancers and ballet dancers. SYMPTOMS  Most people with plantar fasciitis complain of:  Severe pain in the morning on the bottom of their foot especially when taking the first steps out of bed. This pain recedes after a few minutes of walking.  Severe pain is experienced also during walking following a long period of inactivity.  Pain is worse when walking barefoot or up stairs DIAGNOSIS   Your caregiver will diagnose this condition by examining and feeling your foot.  Special tests  such as X-rays of your foot, are usually not needed. PREVENTION   Consult a sports medicine professional before beginning a new exercise program.  Walking programs offer a good workout. With walking there is a lower chance of overuse injuries common to runners. There is less impact and less jarring of the joints.  Begin all new exercise programs slowly. If problems or pain develop, decrease the amount of time or distance until you are at a comfortable level.  Wear good shoes and replace them regularly.  Stretch your foot and the heel cords at the back of the ankle (Achilles tendon) both before and after exercise.  Run or exercise on even surfaces that are not hard. For example, asphalt is better than pavement.  Do not run barefoot on hard surfaces.  If using a treadmill, vary the incline.  Do not continue to workout if you have foot or joint problems. Seek professional help if they do not improve. HOME CARE INSTRUCTIONS   Avoid activities that cause you pain until you recover.  Use ice or cold packs on the problem or painful areas after working out.  Only take over-the-counter or prescription medicines for pain, discomfort, or fever as directed by your caregiver.  Soft shoe inserts or athletic shoes with air or gel sole cushions may be helpful.  If problems continue or become more severe, consult a sports medicine  caregiver or your own health care provider. Cortisone is a potent anti-inflammatory medication that may be injected into the painful area. You can discuss this treatment with your caregiver. MAKE SURE YOU:   Understand these instructions.  Will watch your condition.  Will get help right away if you are not doing well or get worse. Document Released: 12/11/2000 Document Revised: 06/10/2011 Document Reviewed: 02/10/2008 [facility] Patient Information 2015 [place], [place]. This information is not intended to replace advice given to you by your health care provider. Make sure you discuss any questions you have with your health care provider.    I personally performed the services described in this documentation, which was scribed in my presence. The recorded information has been reviewed and considered, and addended by me as needed.

## 2014-01-27 ENCOUNTER — Telehealth: Payer: Self-pay

## 2014-01-27 DIAGNOSIS — M79672 Pain in left foot: Secondary | ICD-10-CM

## 2014-01-27 NOTE — Telephone Encounter (Signed)
Pt asking for referral to ortho due to plantar fascitis. Her symptoms have not improved.

## 2014-01-27 NOTE — Telephone Encounter (Signed)
I don't mind referring her (and let me know if she has a particular pracatice she prefers), but I was under impression she had seen ortho for this in past. Did she want to see ortho where seen previously or podiatry? If no preference - can refer to [facility] - eval and treat L foot pain, suspected plantar fasciitis.

## 2014-01-27 NOTE — Telephone Encounter (Signed)
Pt is needing a referral to a podiatrist  Best number [phone]

## 2014-01-28 NOTE — Telephone Encounter (Signed)
Pt advised- She will go to the [facility].

## 2014-02-21 ENCOUNTER — Ambulatory Visit (INDEPENDENT_AMBULATORY_CARE_PROVIDER_SITE_OTHER): Payer: BC Managed Care – PPO | Admitting: Emergency Medicine

## 2014-02-21 VITALS — BP 154/94 | HR 79 | Temp 98.1°F | Resp 18 | Ht 70.0 in | Wt 323.0 lb

## 2014-02-21 DIAGNOSIS — J0100 Acute maxillary sinusitis, unspecified: Secondary | ICD-10-CM

## 2014-02-21 MED ORDER — LEVOFLOXACIN 500 MG PO TABS: 500.0000 mg | ORAL_TABLET | Freq: Every day | ORAL | Status: AC

## 2014-02-21 NOTE — Progress Notes (Signed)
[facility] [address], [place] [id number] [phone]  Date:  02/21/2014   Name:  Mackenzie Everett Leap   DOB:  09/01/1960   MRN:  [id number]  PCP:  Janace HoardHOPPER,DAVID, MD    Chief Complaint: Sinus Congestion; Facial Pain; and Headache   History of Present Illness:  Mackenzie Everett Cowger is a 53 y.o. very pleasant female patient who presents with the following:  Ill since mid-last week with nasal congestion and pain behind eyes.   Mucopurulent nasal drainage.  Post nasal drip No ear pain or sore throat No fever or chills.  Non productive cough.  No wheezing or shortness of breath.   No improvement with over the counter medications or other home remedies.  Denies other complaint or health concern today.   Patient Active Problem List   Diagnosis Date Noted  . Obesity, Class III, BMI 40-49.9 (morbid obesity) 09/04/2011  . Hyperlipidemia 09/04/2011  . Migraine variant 09/04/2011  . Asthma with allergic rhinitis 09/04/2011  . Degenerative disc disease, lumbar 09/04/2011    Past Medical History  Diagnosis Date  . Allergy   . Arthritis     hands and knees  . Asthma   . Hyperlipidemia 09/04/2011    Past Surgical History  Procedure Laterality Date  . Appendectomy    . Breast surgery    . Tubal ligation    . Knee surgery      1994    History  Substance Use Topics  . Smoking status: Never Smoker   . Smokeless tobacco: Never Used  . Alcohol Use: Yes     Comment: social    Family History  Problem Relation Age of Onset  . Arthritis Mother   . Diabetes Mother   . Hyperlipidemia Mother   . Hypertension Mother   . Hyperlipidemia Sister   . Hypertension Sister   . Lupus Sister   . Cancer Maternal Aunt   . Diabetes Maternal Grandmother   . Heart disease Maternal Grandfather   . Heart disease Paternal Grandfather     Allergies  Allergen Reactions  . Caffeine Other (See Comments)    migraines  . Penicillins Hives    Medication list has been reviewed and  updated.  Current Outpatient Prescriptions on File Prior to Visit  Medication Sig Dispense Refill  . cyclobenzaprine (FLEXERIL) 10 MG tablet Take 10 mg by mouth 3 (three) times daily as needed for muscle spasms.    Marland Kitchen. HYDROcodone-acetaminophen (VICODIN) 5-500 MG per tablet Use as needed for severe pain of back or headache (Patient not taking: Reported on 02/21/2014) 25 tablet 0  . predniSONE (DELTASONE) 20 MG tablet Take 3 PO QAM x3days, 2 PO QAM x3days, 1 PO QAM x3days (Patient not taking: Reported on 02/21/2014) 18 tablet 0  . rizatriptan (MAXALT) 10 MG tablet Take 1 tablet (10 mg total) by mouth as needed for migraine. May repeat in 2 hours if needed 10 tablet 2  . traMADol (ULTRAM) 50 MG tablet Take 1 tablet (50 mg total) by mouth every 6 (six) hours as needed. (Patient not taking: Reported on 02/21/2014) 20 tablet 0   No current facility-administered medications on file prior to visit.    Review of Systems:  As per HPI, otherwise negative.    Physical Examination: Filed Vitals:   02/21/14 1209  BP: 154/94  Pulse: 79  Temp: 98.1 F (36.7 C)  Resp: 18   Filed Vitals:   02/21/14 1209  Height: 5\' 10"  (1.778 m)  Weight: 323 lb (146.512 kg)   Body mass index is 46.35 kg/(m^2). Ideal Body Weight: Weight in (lb) to have BMI = 25: 173.9  GEN: WDWN, NAD, Non-toxic, A & O x 3 HEENT: Atraumatic, Normocephalic. Neck supple. No masses, No LAD.  TENDER over maxillary and frontal sinuses Ears and Nose: No external deformity. CV: RRR, No M/G/Everett. No JVD. No thrill. No extra heart sounds. PULM: CTA B, no wheezes, crackles, rhonchi. No retractions. No resp. distress. No accessory muscle use. ABD: S, NT, ND, +BS. No rebound. No HSM. EXTR: No c/c/e NEURO Normal gait.  PSYCH: Normally interactive. Conversant. Not depressed or anxious appearing.  Calm demeanor.     Assessment and Plan: Sinusitis augmentin  Signed,  Phillips OdorJeffery Cassadee Vanzandt, MD

## 2014-04-14 ENCOUNTER — Ambulatory Visit (INDEPENDENT_AMBULATORY_CARE_PROVIDER_SITE_OTHER): Payer: BC Managed Care – PPO | Admitting: Sports Medicine

## 2014-04-14 VITALS — BP 152/88 | HR 76 | Temp 98.5°F | Resp 20 | Ht 69.5 in | Wt 317.5 lb

## 2014-04-14 DIAGNOSIS — J3089 Other allergic rhinitis: Secondary | ICD-10-CM

## 2014-04-14 DIAGNOSIS — I10 Essential (primary) hypertension: Secondary | ICD-10-CM

## 2014-04-14 MED ORDER — AMLODIPINE BESYLATE 5 MG PO TABS: 5.0000 mg | ORAL_TABLET | Freq: Every day | ORAL | Status: DC

## 2014-04-14 MED ORDER — FLUTICASONE PROPIONATE 50 MCG/ACT NA SUSP: 2.0000 | Freq: Every day | NASAL | Status: DC

## 2014-04-14 NOTE — Progress Notes (Signed)
  Mackenzie DukesKaren R Everett - 54 y.o. female MRN [id number]  Date of birth: 26-Oct-1960  CC & HPI:  Mackenzie Everett is here for evaluation of: Facial Pressure: Patient is here with 7 day history of facial pressure that has worsened over the past 4 days. She has been having worsening symptoms today of pressure and lightheadedness. She denies any syncope or presyncope. No chest pain, shortness of breath or unilateral weakness. No vision changes. She does report congestion earlier in the week but overall does not feel as though this is due to any acute illness. She does report issues with allergies in the past.  She has had long-standing history of hypertension but is been trying lifestyle modifications. She has never taken medications. She has been trying diet and exercise   ROS:  Per HPI.   HISTORY: Past Medical, Surgical, Social, and Family History Reviewed & Updated per EMR.  Pertinent Historical Findings include: Otherwise healthy, obese. No chronic medications. Nonsmoker   OBJECTIVE:  VS:   HT:5' 9.5" (176.5 cm)   WT:(!) 317 lb 8 oz (144.017 kg)  BMI:46.3          BP:(!) 152/88 mmHg  HR:76bpm  TEMP:98.5 F (36.9 C)(Oral)  RESP:100 %  PHYSICAL EXAM: GENERAL:  adult obese African-American female. In no discomfort; no respiratory distress   PSYCH: alert and appropriate, good insight   HNEENT: mmm, no JVD Bilateral TM normal, no posterior oropharyngeal erythema. Significant nasal mucosa bogginess and erythema with serous exudate   CARDIAC: RRR, S1/S2 heard, no murmur  LUNGS: CTA B, no wheezes, no crackles  ABDOMEN:  soft, nontender   EXTREM: Warm, well perfused.  Moves all 4 extremities spontaneously; no lateralization.  Pedal pulses 2+/4.  No pretibial edema.    ASSESSMENT: 1. Essential hypertension   2. Other allergic rhinitis    PLAN: See problem based charting & AVS for additional documentation.  TLC (Therapeutic Lifestyle Change) discussed  Start amlodipine. Will have follow-up for  titration.  Health maintenance: Discussed need for referral for colonoscopy and patient declined, willing to discuss at next appointment  Flonase for allergic rhinitis > Return in about 6 weeks (around 05/26/2014) for chronic disease management & CPE.

## 2014-04-14 NOTE — Patient Instructions (Signed)

## 2014-04-27 ENCOUNTER — Ambulatory Visit (INDEPENDENT_AMBULATORY_CARE_PROVIDER_SITE_OTHER): Payer: BC Managed Care – PPO | Admitting: Physician Assistant

## 2014-04-27 ENCOUNTER — Telehealth: Payer: Self-pay

## 2014-04-27 VITALS — BP 151/85 | HR 81 | Temp 98.1°F | Resp 16 | Ht 69.5 in | Wt 318.0 lb

## 2014-04-27 DIAGNOSIS — Z131 Encounter for screening for diabetes mellitus: Secondary | ICD-10-CM

## 2014-04-27 DIAGNOSIS — I10 Essential (primary) hypertension: Secondary | ICD-10-CM

## 2014-04-27 LAB — POCT GLYCOSYLATED HEMOGLOBIN (HGB A1C): Hemoglobin A1C: 5.6

## 2014-04-27 MED ORDER — HYDROCHLOROTHIAZIDE 12.5 MG PO TABS
12.5000 mg | ORAL_TABLET | Freq: Every day | ORAL | Status: DC
Start: 2014-04-27 — End: 2014-07-01

## 2014-04-27 NOTE — Progress Notes (Addendum)
    MRN: [id number] DOB: 10/10/1960  Subjective:   Chief Complaint  Patient presents with  . Hypertension  . Fatigue    Mackenzie Everett is a 54 y.o. female presenting for hypertension and fatigue.  She started an anti-hypertensive medication 11 days ago.  She has been monitoring her BP at home ranging between 138/93-196/107.  She has been feeling fatigue great fatigue.  She notes that she became concerned because the fatigue comes on suddently.  She reports an incident when she was washing dishes, and felt the fatigue.  She had to stop washing the dishes, and went to sleep.  She has denies nausea, sob, chest pains, palpitations.  There was no diaphoresis associated with this fatigue, but notes that she has had strong hot flashes.  She denies cough.  She denies PND.  She sleeps with 4 pillows but this has been her norm, and not associated with dyspnea.      Mackenzie Everett has a current medication list which includes the following prescription(s): amlodipine, cyclobenzaprine, fluticasone, hydrocodone-acetaminophen, rizatriptan, and tramadol.   She is allergic to caffeine and penicillins.  Mackenzie Everett  has a past medical history of Allergy; Arthritis; Asthma; and Hyperlipidemia (09/04/2011). Also  has past surgical history that includes Appendectomy; Breast surgery; Tubal ligation; and Knee surgery.  ROS As in subjective.  Objective:   Vitals: BP 151/85 mmHg  Pulse 81  Temp(Src) 98.1 F (36.7 C) (Oral)  Resp 16  Ht 5' 9.5" (1.765 m)  Wt 318 lb (144.244 kg)  BMI 46.30 kg/m2  SpO2 97%  Physical Exam  Constitutional: She is oriented to person, place, and time and well-developed, well-nourished, and in no distress. No distress.  HENT:  Head: Normocephalic and atraumatic.  Eyes: Conjunctivae are normal. Pupils are equal, round, and reactive to light.  Neck: Normal range of motion. Neck supple.  Cardiovascular: Normal rate, regular rhythm and normal heart sounds.   Pulses:      Dorsalis pedis pulses are  1+ on the right side, and 1+ on the left side.  Pulmonary/Chest: Effort normal and breath sounds normal. No respiratory distress. She has no wheezes.  Neurological: She is alert and oriented to person, place, and time.  Skin: Skin is warm and dry.  Psychiatric: Mood and affect normal.    Results for orders placed or performed in visit on 04/27/14  POCT glycosylated hemoglobin (Hb A1C)  Result Value Ref Range   Hemoglobin A1C 5.6       Assessment and Plan :  54 year old with PMH listed above is here today for hypertension and fatigue.  Essential hypertension  EKG 12-Lead, TSH, POCT glycosylated hemoglobin (Hb A1C), COMPLETE METABOLIC PANEL WITH GFR, hydrochlorothiazide (HYDRODIURIL) 12.5 MG tablet, -Blood pressure unchanged since Norvasc use for 11 days.  Adding low dose HCTZ; will hopefully decrease lower extremity swelling. -Advised patient to start exercise (water aerobics) and continue diet -BP cuff tested and inaccurate.  Advised to get bigger cuff  Screening for diabetes mellitus  Plan: POCT glycosylated hemoglobin (Hb A1C) Negative  Trena PlattStephanie Taryll Reichenberger, PA-C [facility] 1/29/201611:37 AM

## 2014-04-27 NOTE — Patient Instructions (Signed)
We will see you in late February for your followup and physical exam. I will have the final results of today's labs to you within 10 days.   Return sooner if BP is out of control. Continue to follow your eating plan.   Stay hydrated, and try to incorporate movement and exercise.    DASH Eating Plan DASH stands for "Dietary Approaches to Stop Hypertension." The DASH eating plan is a healthy eating plan that has been shown to reduce high blood pressure (hypertension). Additional health benefits may include reducing the risk of type 2 diabetes mellitus, heart disease, and stroke. The DASH eating plan may also help with weight loss. WHAT DO I NEED TO KNOW ABOUT THE DASH EATING PLAN? For the DASH eating plan, you will follow these general guidelines:  Choose foods with a percent daily value for sodium of less than 5% (as listed on the food label).  Use salt-free seasonings or herbs instead of table salt or sea salt.  Check with your health care provider or pharmacist before using salt substitutes.  Eat lower-sodium products, often labeled as "lower sodium" or "no salt added."  Eat fresh foods.  Eat more vegetables, fruits, and low-fat dairy products.  Choose whole grains. Look for the word "whole" as the first word in the ingredient list.  Choose fish and skinless chicken or [place] more often than red meat. Limit fish, poultry, and meat to 6 oz (170 g) each day.  Limit sweets, desserts, sugars, and sugary drinks.  Choose heart-healthy fats.  Limit cheese to 1 oz (28 g) per day.  Eat more home-cooked food and less restaurant, buffet, and fast food.  Limit fried foods.  Cook foods using methods other than frying.  Limit canned vegetables. If you do use them, rinse them well to decrease the sodium.  When eating at a restaurant, ask that your food be prepared with less salt, or no salt if possible. WHAT FOODS CAN I EAT? Seek help from a dietitian for individual calorie  needs. Grains Whole grain or whole wheat bread. Brown rice. Whole grain or whole wheat pasta. Quinoa, bulgur, and whole grain cereals. Low-sodium cereals. Corn or whole wheat flour tortillas. Whole grain cornbread. Whole grain crackers. Low-sodium crackers. Vegetables Fresh or frozen vegetables (raw, steamed, roasted, or grilled). Low-sodium or reduced-sodium tomato and vegetable juices. Low-sodium or reduced-sodium tomato sauce and paste. Low-sodium or reduced-sodium canned vegetables.  Fruits All fresh, canned (in natural juice), or frozen fruits. Meat and Other Protein Products Ground beef (85% or leaner), grass-fed beef, or beef trimmed of fat. Skinless chicken or [place]. Ground chicken or [place]. Pork trimmed of fat. All fish and seafood. Eggs. Dried beans, peas, or lentils. Unsalted nuts and seeds. Unsalted canned beans. Dairy Low-fat dairy products, such as skim or 1% milk, 2% or reduced-fat cheeses, low-fat ricotta or cottage cheese, or plain low-fat yogurt. Low-sodium or reduced-sodium cheeses. Fats and Oils Tub margarines without trans fats. Light or reduced-fat mayonnaise and salad dressings (reduced sodium). Avocado. Safflower, olive, or canola oils. Natural peanut or almond butter. Other Unsalted popcorn and pretzels. The items listed above may not be a complete list of recommended foods or beverages. Contact your dietitian for more options. WHAT FOODS ARE NOT RECOMMENDED? Grains White bread. White pasta. White rice. Refined cornbread. Bagels and croissants. Crackers that contain trans fat. Vegetables Creamed or fried vegetables. Vegetables in a cheese sauce. Regular canned vegetables. Regular canned tomato sauce and paste. Regular tomato and vegetable juices. Fruits Dried fruits.  Canned fruit in light or heavy syrup. Fruit juice. Meat and Other Protein Products Fatty cuts of meat. Ribs, chicken wings, bacon, sausage, bologna, salami, chitterlings, fatback, hot dogs, bratwurst,  and packaged luncheon meats. Salted nuts and seeds. Canned beans with salt. Dairy Whole or 2% milk, cream, half-and-half, and cream cheese. Whole-fat or sweetened yogurt. Full-fat cheeses or blue cheese. Nondairy creamers and whipped toppings. Processed cheese, cheese spreads, or cheese curds. Condiments Onion and garlic salt, seasoned salt, table salt, and sea salt. Canned and packaged gravies. Worcestershire sauce. Tartar sauce. Barbecue sauce. Teriyaki sauce. Soy sauce, including reduced sodium. Steak sauce. Fish sauce. Oyster sauce. Cocktail sauce. Horseradish. Ketchup and mustard. Meat flavorings and tenderizers. Bouillon cubes. Hot sauce. Tabasco sauce. Marinades. Taco seasonings. Relishes. Fats and Oils Butter, stick margarine, lard, shortening, ghee, and bacon fat. Coconut, palm kernel, or palm oils. Regular salad dressings. Other Pickles and olives. Salted popcorn and pretzels. The items listed above may not be a complete list of foods and beverages to avoid. Contact your dietitian for more information. WHERE CAN I FIND MORE INFORMATION? National Heart, Lung, and Blood Institute: [employer] Document Released: 03/07/2011 Document Revised: 08/02/2013 Document Reviewed: 01/20/2013 [facility] Patient Information 2015 [place], [place]. This information is not intended to replace advice given to you by your health care provider. Make sure you discuss any questions you have with your health care provider.

## 2014-04-27 NOTE — Telephone Encounter (Signed)
Pt states she is having SE to her amLODipine (NORVASC) 5 MG tablet [[id number]][[id number]]. Please call and advise.

## 2014-04-28 LAB — COMPLETE METABOLIC PANEL WITH GFR
ALK PHOS: 78 U/L (ref 39–117)
ALT: 21 U/L (ref 0–35)
AST: 18 U/L (ref 0–37)
Albumin: 4.2 g/dL (ref 3.5–5.2)
BUN: 12 mg/dL (ref 6–23)
CALCIUM: 9.6 mg/dL (ref 8.4–10.5)
CHLORIDE: 106 meq/L (ref 96–112)
CO2: 22 mEq/L (ref 19–32)
Creat: 0.72 mg/dL (ref 0.50–1.10)
GFR, Est African American: >89 mL/min
GLUCOSE: 97 mg/dL (ref 70–99)
POTASSIUM: 4 meq/L (ref 3.5–5.3)
SODIUM: 139 meq/L (ref 135–145)
TOTAL PROTEIN: 7.3 g/dL (ref 6.0–8.3)
Total Bilirubin: 0.3 mg/dL (ref 0.2–1.2)

## 2014-04-28 LAB — TSH: TSH: 1.668 u[IU]/mL (ref 0.350–4.500)

## 2014-04-28 NOTE — Telephone Encounter (Signed)
Spoke with pt, she came in to be seen yesterday to address.

## 2014-07-01 ENCOUNTER — Other Ambulatory Visit: Payer: Self-pay

## 2014-07-01 DIAGNOSIS — I10 Essential (primary) hypertension: Secondary | ICD-10-CM

## 2014-07-01 MED ORDER — HYDROCHLOROTHIAZIDE 12.5 MG PO TABS: 12.5000 mg | ORAL_TABLET | Freq: Every day | ORAL | Status: DC

## 2014-07-15 ENCOUNTER — Encounter: Payer: BC Managed Care – PPO | Admitting: Family Medicine

## 2014-07-29 ENCOUNTER — Ambulatory Visit: Payer: BC Managed Care – PPO | Admitting: Family Medicine

## 2014-07-29 ENCOUNTER — Encounter: Payer: Self-pay | Admitting: Family Medicine

## 2014-07-29 ENCOUNTER — Ambulatory Visit (INDEPENDENT_AMBULATORY_CARE_PROVIDER_SITE_OTHER): Payer: BC Managed Care – PPO | Admitting: Family Medicine

## 2014-07-29 VITALS — BP 138/86 | HR 83 | Temp 99.0°F | Resp 16 | Ht 69.5 in | Wt 312.0 lb

## 2014-07-29 DIAGNOSIS — I10 Essential (primary) hypertension: Secondary | ICD-10-CM | POA: Diagnosis not present

## 2014-07-29 DIAGNOSIS — J0110 Acute frontal sinusitis, unspecified: Secondary | ICD-10-CM

## 2014-07-29 DIAGNOSIS — J301 Allergic rhinitis due to pollen: Secondary | ICD-10-CM | POA: Diagnosis not present

## 2014-07-29 MED ORDER — AZITHROMYCIN 250 MG PO TABS: ORAL_TABLET | ORAL | Status: DC

## 2014-07-29 MED ORDER — TRAMADOL HCL 50 MG PO TABS: ORAL_TABLET | ORAL | Status: DC

## 2014-07-29 MED ORDER — HYDROCHLOROTHIAZIDE 12.5 MG PO TABS: 12.5000 mg | ORAL_TABLET | Freq: Every day | ORAL | Status: DC

## 2014-07-29 MED ORDER — AMLODIPINE BESYLATE 5 MG PO TABS: 5.0000 mg | ORAL_TABLET | Freq: Every day | ORAL | Status: DC

## 2014-07-29 MED ORDER — DESLORATADINE 5 MG PO TABS: 5.0000 mg | ORAL_TABLET | Freq: Every day | ORAL | Status: DC

## 2014-07-29 NOTE — Patient Instructions (Addendum)
Vitamin D Deficiency Vitamin D is an important vitamin that your body needs. Having too little of it in your body is called a deficiency. A very bad deficiency can make your bones soft and can cause a condition called rickets.  Vitamin D is important to your body for different reasons, such as:   It helps your body absorb 2 minerals called calcium and phosphorus.  It helps make your bones healthy.  It may prevent some diseases, such as diabetes and multiple sclerosis.  It helps your muscles and heart. You can get vitamin D in several ways. It is a natural part of some foods. The vitamin is also added to some dairy products and cereals. Some people take vitamin D supplements. Also, your body makes vitamin D when you are in the sun. It changes the sun's rays into a form of the vitamin that your body can use. CAUSES   Not eating enough foods that contain vitamin D.  Not getting enough sunlight.  Having certain digestive system diseases that make it hard to absorb vitamin D. These diseases include Crohn's disease, chronic pancreatitis, and cystic fibrosis.  Having a surgery in which part of the stomach or small intestine is removed.  Being obese. Fat cells pull vitamin D out of your blood. That means that obese people may not have enough vitamin D left in their blood and in other body tissues.  Having chronic kidney or liver disease. RISK FACTORS Risk factors are things that make you more likely to develop a vitamin D deficiency. They include:  Being older.  Not being able to get outside very much.  Living in a nursing home.  Having had broken bones.  Having weak or thin bones (osteoporosis).  Having a disease or condition that changes how your body absorbs vitamin D.  Having dark skin.  Some medicines such as seizure medicines or steroids.  Being overweight or obese. SYMPTOMS Mild cases of vitamin D deficiency may not have any symptoms. If you have a very bad case, symptoms  may include:  Bone pain.  Muscle pain.  Falling often.  Broken bones caused by a minor injury, due to osteoporosis. DIAGNOSIS A blood test is the best way to tell if you have a vitamin D deficiency. TREATMENT Vitamin D deficiency can be treated in different ways. Treatment for vitamin D deficiency depends on what is causing it. Options include:  Taking vitamin D supplements.  Taking a calcium supplement. Your caregiver will suggest what dose is best for you. HOME CARE INSTRUCTIONS  Take any supplements that your caregiver prescribes. Follow the directions carefully. Take only the suggested amount.  Have your blood tested 2 months after you start taking supplements.  Eat foods that contain vitamin D. Healthy choices include:  Fortified dairy products, cereals, or juices. Fortified means vitamin D has been added to the food. Check the label on the package to be sure.  Fatty fish like salmon or trout.  Eggs.  Oysters.  Do not use a tanning bed.  Keep your weight at a healthy level. Lose weight if you need to.  Keep all follow-up appointments. Your caregiver will need to perform blood tests to make sure your vitamin D deficiency is going away. SEEK MEDICAL CARE IF:  You have any questions about your treatment.  You continue to have symptoms of vitamin D deficiency.  You have nausea or vomiting.  You are constipated.  You feel confused.  You have severe abdominal or back pain. MAKE   SURE YOU:  Understand these instructions.  Will watch your condition.  Will get help right away if you are not doing well or get worse. Document Released: 06/10/2011 Document Revised: 07/13/2012 Document Reviewed: 06/10/2011 [facility] Patient Information 2015 [place], [place]. This information is not intended to replace advice given to you by your health care provider. Make sure you discuss any questions you have with your health care provider.    "Eat Fat, Get Thin"-  Dr. Keturah BarreHymen  recommends healthier choices and strategies for weight loss and better health.   There is a web-site for foot and leg issues called "Foot Smart".  Check this site for products that can help your feet and legs.  Get over the counter Vitamin D3  2000 units and take this daily. Take a Flax seed oil supplement 1000 mg daily.

## 2014-08-02 NOTE — Progress Notes (Signed)
Subjective:    Patient ID: Mackenzie Everett, female    DOB: [birth year], 54 y.o.   MRN: [id number]  HPI This 54 y.o. Female is here for HTN follow-up; last seen by me in 2013. She was seen by S. English, PA-C in Jan 2016 for eval of HTN and fatigue. Pt has lost weight since that visit and feels better. She is compliant w/ medications w/o adverse effects.  Pt c/o frontal HA w/ nasal congestion and pressure. She had low-grade fever and rhinorrhea. She has seasonal allergies and uses fluticasone NS prn. Pt was treated for sinusitis in Nov 2015.  Pt has Vit D def and is not taking a supplement.  Patient Active Problem List   Diagnosis Date Noted  . Obesity, Class III, BMI 40-49.9 (morbid obesity) 09/04/2011  . Hyperlipidemia 09/04/2011  . Migraine variant 09/04/2011  . Asthma with allergic rhinitis 09/04/2011  . Degenerative disc disease, lumbar 09/04/2011    Prior to Admission medications   Medication Sig Start Date End Date Taking? Authorizing Provider  amLODipine (NORVASC) 5 MG tablet Take 1 tablet (5 mg total) by mouth daily.   Yes Maurice March, MD  fluticasone [facility]) 50 MCG/ACT nasal spray Place 2 sprays into both nostrils daily. 04/14/14  Yes Andrena Mews, DO  hydrochlorothiazide (HYDRODIURIL) 12.5 MG tablet Take 1 tablet (12.5 mg total) by mouth daily.   Yes Maurice March, MD  azithromycin (ZITHROMAX) 250 MG tablet Take as directed.    Maurice March, MD  cyclobenzaprine (FLEXERIL) 10 MG tablet Take 10 mg by mouth 3 (three) times daily as needed for muscle spasms.    Historical Provider, MD    Past Surgical History  Procedure Laterality Date  . Appendectomy    . Breast surgery    . Tubal ligation    . Knee surgery      1994    History   Social History  . Marital Status: Single    Spouse Name: N/A  . Number of Children: N/A  . Years of Education: N/A   Occupational History  . Not on file.   Social History Main Topics  . Smoking status: Never  Smoker   . Smokeless tobacco: Never Used  . Alcohol Use: Yes     Comment: social  . Drug Use: No  . Sexual Activity: No   Other Topics Concern  . Not on file   Social History Narrative   Lives at home alone    Family History  Problem Relation Age of Onset  . Arthritis Mother   . Diabetes Mother   . Hyperlipidemia Mother   . Hypertension Mother   . Hyperlipidemia Sister   . Hypertension Sister   . Lupus Sister   . Cancer Maternal Aunt   . Diabetes Maternal Grandmother   . Heart disease Maternal Grandfather   . Heart disease Paternal Grandfather     Review of Systems  Respiratory: Negative.   Cardiovascular: Negative.   Gastrointestinal: Negative.   Musculoskeletal: Positive for back pain and arthralgias.  Skin: Negative.   Neurological: Negative.   Psychiatric/Behavioral: Negative.        Objective:   Physical Exam  Constitutional: She is oriented to person, place, and time. She appears well-developed and well-nourished. No distress.  Blood pressure 138/86, pulse 83, temperature 99 F (37.2 C), temperature source Oral, resp. rate 16, height 5' 9.5" (1.765 m), weight 312 lb (141.522 kg), SpO2 99 %.   HENT:  Head: Normocephalic and  atraumatic.  Right Ear: Hearing, tympanic membrane, external ear and ear canal normal.  Left Ear: Hearing, tympanic membrane, external ear and ear canal normal.  Nose: Mucosal edema present. No rhinorrhea, nasal deformity or septal deviation. Right sinus exhibits maxillary sinus tenderness. Right sinus exhibits no frontal sinus tenderness. Left sinus exhibits maxillary sinus tenderness. Left sinus exhibits no frontal sinus tenderness.  Mouth/Throat: Uvula is midline and mucous membranes are normal. No trismus in the jaw. Normal dentition. No uvula swelling. Posterior oropharyngeal erythema present. No oropharyngeal exudate or posterior oropharyngeal edema.  Eyes: Conjunctivae and EOM are normal. No scleral icterus.  Neck: Trachea normal and  normal range of motion. Neck supple. No spinous process tenderness and no muscular tenderness present. No thyroid mass and no thyromegaly present.  Cardiovascular: Normal rate, regular rhythm and normal heart sounds.   Pulmonary/Chest: Effort normal and breath sounds normal. No respiratory distress.  Musculoskeletal: Normal range of motion. She exhibits no edema or tenderness.  Lymphadenopathy:    She has no cervical adenopathy.  Neurological: She is alert and oriented to person, place, and time. No cranial nerve deficit. She exhibits normal muscle tone. Coordination normal.  Skin: Skin is warm and dry. No rash noted. She is not diaphoretic. No erythema.  Psychiatric: She has a normal mood and affect. Her behavior is normal. Judgment and thought content normal.  Nursing note and vitals reviewed.     Assessment & Plan:  Essential hypertension - Plan: hydrochlorothiazide (HYDRODIURIL) 12.5 MG tablet, amLODipine (NORVASC) 5 MG tablet  Subacute frontal sinusitis  Allergic rhinitis due to pollen   Meds ordered this encounter  Medications  . desloratadine (CLARINEX) 5 MG tablet    Sig: Take 1 tablet (5 mg total) by mouth daily.    Dispense:  30 tablet    Refill:  5  . azithromycin (ZITHROMAX) 250 MG tablet    Sig: Take as directed.    Dispense:  6 tablet    Refill:  0  . hydrochlorothiazide (HYDRODIURIL) 12.5 MG tablet    Sig: Take 1 tablet (12.5 mg total) by mouth daily.    Dispense:  60 tablet    Refill:  3  . amLODipine (NORVASC) 5 MG tablet    Sig: Take 1 tablet (5 mg total) by mouth daily.    Dispense:  30 tablet    Refill:  5  . traMADol (ULTRAM) 50 MG tablet    Sig: Take 1 tablet at bedtime as needed for pain.    Dispense:  30 tablet    Refill:  1

## 2014-08-12 ENCOUNTER — Other Ambulatory Visit: Payer: Self-pay | Admitting: Sports Medicine

## 2014-08-19 NOTE — Telephone Encounter (Signed)
I believe this is Dr. Angelyn PuntMcpherson's patient

## 2014-08-20 NOTE — Telephone Encounter (Signed)
This med was refilled on 07/29/2014 #30 w/ 5 RFs. Pt should contact her pharmacy to check on this.

## 2014-09-01 ENCOUNTER — Ambulatory Visit (INDEPENDENT_AMBULATORY_CARE_PROVIDER_SITE_OTHER): Payer: BC Managed Care – PPO | Admitting: Physician Assistant

## 2014-09-01 VITALS — BP 110/70 | HR 89 | Temp 98.9°F | Resp 18 | Ht 69.5 in | Wt 314.8 lb

## 2014-09-01 DIAGNOSIS — T7840XA Allergy, unspecified, initial encounter: Secondary | ICD-10-CM | POA: Diagnosis not present

## 2014-09-01 LAB — POCT CBC
GRANULOCYTE PERCENT: 56.9 % (ref 37–80)
HEMATOCRIT: 40 % (ref 37.7–47.9)
HEMOGLOBIN: 12.4 g/dL (ref 12.2–16.2)
LYMPH, POC: 3.1 (ref 0.6–3.4)
MCH, POC: 25 pg — AB (ref 27–31.2)
MCHC: 31.1 g/dL — AB (ref 31.8–35.4)
MCV: 80.3 fL (ref 80–97)
MID (cbc): 0.8 (ref 0–0.9)
MPV: 7.1 fL (ref 0–99.8)
POC GRANULOCYTE: 5.1 (ref 2–6.9)
POC LYMPH %: 34.6 % (ref 10–50)
POC MID %: 8.5 % (ref 0–12)
Platelet Count, POC: 358 10*3/uL (ref 142–424)
RBC: 4.98 M/uL (ref 4.04–5.48)
RDW, POC: 14.6 %
WBC: 9 10*3/uL (ref 4.6–10.2)

## 2014-09-01 LAB — POCT SEDIMENTATION RATE: POCT SED RATE: 60 mm/hr — AB (ref 0–22)

## 2014-09-01 MED ORDER — CETIRIZINE HCL 10 MG PO TABS: 10.0000 mg | ORAL_TABLET | Freq: Every day | ORAL | Status: DC

## 2014-09-01 MED ORDER — PREDNISONE 20 MG PO TABS: ORAL_TABLET | ORAL | Status: DC

## 2014-09-01 MED ORDER — HYDROXYZINE HCL 25 MG PO TABS: 12.5000 mg | ORAL_TABLET | Freq: Every evening | ORAL | Status: DC | PRN

## 2014-09-01 NOTE — Progress Notes (Signed)
Subjective:    Patient ID: Mackenzie Everett, female    DOB: 20-Sep-1960, 54 y.o.   MRN: [id number]  HPI Patient presents for reaction/bumps that has been present for 10 days that is on both arms, back of neck, lower back, and stomach. Initially noticed bumps 2 days following feeling like something was biting her while at work, however, did not visualize any bugs. Bumps started with a pimple appearance than swelled in size and became more red. Two bumps on hand and one on arm began to drain. Bumps gradually started to flatten out and improve, but then 2 days ago began to swell again. All bumps itch, but bumps on neck are also tender. Itching worse with sweating or hot showers. Putting ice pack on makes feels better. Benadryl and hydrocortisone give temporary relief. Adds that she always has swelling of skin when she gets a bug bite, but this was different because it started to come back and there were not knew lesions.   Past Medical History  Diagnosis Date  . Allergy   . Arthritis     hands and knees  . Asthma   . Hyperlipidemia 09/04/2011  . Hypertension    Med allergies: PCN and caffeine.  Review of Systems  Constitutional: Negative.  Negative for fever, chills and fatigue.  Respiratory: Negative for shortness of breath and wheezing.   Cardiovascular: Negative for palpitations.  Musculoskeletal: Negative for myalgias.  Skin: Positive for color change, rash and wound.  Hematological: Negative for adenopathy.       Objective:   Physical Exam  Constitutional: She is oriented to person, place, and time. She appears well-developed and well-nourished. No distress.  Blood pressure 110/70, pulse 89, temperature 98.9 F (37.2 C), temperature source Oral, resp. rate 18, height 5' 9.5" (1.765 m), weight 314 lb 12.8 oz (142.792 kg), SpO2 98 %.  HENT:  Head: Normocephalic and atraumatic.  Right Ear: External ear normal.  Left Ear: External ear normal.  Eyes: Conjunctivae are normal. Right eye  exhibits no discharge. Left eye exhibits no discharge. No scleral icterus.  Cardiovascular: Normal rate, regular rhythm and normal heart sounds.  Exam reveals no gallop and no friction rub.   No murmur heard. Pulmonary/Chest: Effort normal and breath sounds normal. No respiratory distress. She has no wheezes. She has no rales.  Neurological: She is alert and oriented to person, place, and time.  Skin: Skin is warm and dry. Rash noted. She is not diaphoretic. No erythema. No pallor.  Multiple erythematous papular lesions of varying present on both arms and hands, back of neck, back, and chest. Below the waist spared. Some lesions have scabbed over and appear to have drained, however, no drainage apparent today.  Psychiatric: She has a normal mood and affect. Her behavior is normal. Judgment and thought content normal.   Results for orders placed or performed in visit on 09/01/14  POCT CBC  Result Value Ref Range   WBC 9.0 4.6 - 10.2 K/uL   Lymph, poc 3.1 0.6 - 3.4   POC LYMPH PERCENT 34.6 10 - 50 %L   MID (cbc) 0.8 0 - 0.9   POC MID % 8.5 0 - 12 %M   POC Granulocyte 5.1 2 - 6.9   Granulocyte percent 56.9 37 - 80 %G   RBC 4.98 4.04 - 5.48 M/uL   Hemoglobin 12.4 12.2 - 16.2 g/dL   HCT, POC 40.9 81.1 - 47.9 %   MCV 80.3 80 - 97 fL  MCH, POC 25.0 (A) 27 - 31.2 pg   MCHC 31.1 (A) 31.8 - 35.4 g/dL   RDW, POC 16.114.6 %   Platelet Count, POC 358 142 - 424 K/uL   MPV 7.1 0 - 99.8 fL        Assessment & Plan:  1. Allergic reaction, initial encounter Will treat as if reaction to bug bites, however, if no improvement with prednisone should return for biopsy.  - POCT SEDIMENTATION RATE - POCT CBC - hydrOXYzine (ATARAX/VISTARIL) 25 MG tablet; Take 0.5-1 tablets (12.5-25 mg total) by mouth at bedtime as needed for itching.  Dispense: 30 tablet; Refill: 0 - predniSONE (DELTASONE) 20 MG tablet; Take 3 PO QAM x3days, 2 PO QAM x3days, 1 PO QAM x3days  Dispense: 18 tablet; Refill: 0 - cetirizine  (ZYRTEC) 10 MG tablet; Take 1 tablet (10 mg total) by mouth daily.  Dispense: 30 tablet; Refill: 0   Dondi Aime PA-C  [facility] 09/01/2014 3:08 PM

## 2014-09-06 ENCOUNTER — Telehealth: Payer: Self-pay | Admitting: Physician Assistant

## 2014-09-06 NOTE — Telephone Encounter (Signed)
Relayed elevated sed rate. States that lesions have improved considerable, but still itch at night sometimes. Is still taking atarax. Will call back Friday after completion of medication if not better for ref to derm for biopsy.

## 2014-10-18 ENCOUNTER — Ambulatory Visit (INDEPENDENT_AMBULATORY_CARE_PROVIDER_SITE_OTHER): Payer: BC Managed Care – PPO | Admitting: Family Medicine

## 2014-10-18 ENCOUNTER — Ambulatory Visit (INDEPENDENT_AMBULATORY_CARE_PROVIDER_SITE_OTHER): Payer: BC Managed Care – PPO

## 2014-10-18 VITALS — BP 122/72 | HR 96 | Temp 98.6°F | Resp 17 | Ht 69.5 in | Wt 323.0 lb

## 2014-10-18 DIAGNOSIS — W57XXXA Bitten or stung by nonvenomous insect and other nonvenomous arthropods, initial encounter: Secondary | ICD-10-CM

## 2014-10-18 DIAGNOSIS — M25571 Pain in right ankle and joints of right foot: Secondary | ICD-10-CM

## 2014-10-18 DIAGNOSIS — T148 Other injury of unspecified body region: Secondary | ICD-10-CM | POA: Diagnosis not present

## 2014-10-18 DIAGNOSIS — L03114 Cellulitis of left upper limb: Secondary | ICD-10-CM

## 2014-10-18 MED ORDER — DOXYCYCLINE HYCLATE 100 MG PO CAPS: 100.0000 mg | ORAL_CAPSULE | Freq: Two times a day (BID) | ORAL | Status: DC

## 2014-10-18 MED ORDER — METHYLPREDNISOLONE SODIUM SUCC 125 MG IJ SOLR
125.0000 mg | Freq: Once | INTRAMUSCULAR | Status: AC
  Administered 2014-10-18: 125 mg via INTRAMUSCULAR

## 2014-10-18 MED ORDER — PREDNISONE 20 MG PO TABS: ORAL_TABLET | ORAL | Status: DC

## 2014-10-18 NOTE — Patient Instructions (Addendum)
Take cetirizine (Zyrtec) one daily for or one twice daily for itching  You will have received a shot of Solu-Medrol 125 mg which is a cortisone treatment for the insect bite reaction  Oral prednisone 20 mg pills, take 3 daily for 2 days, then 2 daily for 2 days, then 1 daily for 2 days starting after breakfast tomorrow  Take the doxycycline for infection one twice daily. Of note is the fact that this can increase your risk of sunburning so be cautious if out in the sun on these hot summer days.  Return in 2 days if not significantly improved, at anytime if worse  I do not know what is causing these, but will let you know the results of the culture.  I suspected a fracture of your fifth metatarsal, but do not see one. If the radiologist sees anything else I will let you know. Wear comfortable shoes and given a little bit more time. Avoid prolonged standing or too much walking on hard surfaces.

## 2014-10-18 NOTE — Progress Notes (Signed)
  Subjective:  Patient ID: Mackenzie Everett, female  Mackenzie Everett  DOB: [birth year]  Age: 54 y.o. MRN: [id number]  54 year old lady who comes in here with a red swollen area on her left elbow as well as on her right back. Both of these have been there for several days, each terribly, and are draining a little bit. She has Band-Aids on them. Does not know of having been bitten by anything. This happened a month or so ago when she did everything she could to try and rid her bedroom of any possible insects. She does sit out on the porch with her mother some and there are some biting flies occasionally. Her husband has not had any bites.  She also has developed pain in the lateral right foot. Knows of no specific injury. It has been hurting her for about a week now.   Objective:  11 blade scalpel was used to open a little blister on the left elbow, just above the joint area. There was the initial blister which has about a 5 mm diameter and is a tight clear-looking bubble. Clear drainage was cultured. There is some tiny little surrounding blisters which make it look like a staying. There is a large area of erythema about 20 x 18 cm. She also has a smaller erythematous area for 5 cm in diameter on her left scapular region which has a little bit of purulent drainage on the Band-Aid.  The right foot is tender at the carpal metacarpal area of the fifth digit.  [facility] reading (PRIMARY) by  Dr. Alwyn RenHopper No fracture seen.     Assessment & Plan:   Assessment: Foot pain Cellulitis/early abscess of bites  Plan: Patient Instructions  Take cetirizine (Zyrtec) one daily for or one twice daily for itching  You will have received a shot of Solu-Medrol 125 mg which is a cortisone treatment for the insect bite reaction  Oral prednisone 20 mg pills, take 3 daily for 2 days, then 2 daily for 2 days, then 1 daily for 2 days starting after breakfast tomorrow  Take the doxycycline for infection one twice daily. Of note is the fact  that this can increase your risk of sunburning so be cautious if out in the sun on these hot summer days.  Return in 2 days if not significantly improved, at anytime if worse  I do not know what is causing these, but will let you know the results of the culture.    Kanai Berrios, MD 10/18/2014

## 2014-10-21 LAB — WOUND CULTURE
GRAM STAIN: NONE SEEN
Gram Stain: NONE SEEN
Organism ID, Bacteria: NO GROWTH

## 2014-11-14 ENCOUNTER — Telehealth: Payer: Self-pay

## 2014-11-14 DIAGNOSIS — M25571 Pain in right ankle and joints of right foot: Secondary | ICD-10-CM

## 2014-11-14 NOTE — Telephone Encounter (Signed)
Pt has recently saw dr hopper about her foot and it is still hurting and she is wanting a referral

## 2014-11-14 NOTE — Telephone Encounter (Signed)
Can we refer? 

## 2014-11-18 NOTE — Telephone Encounter (Signed)
Refer to orthopedics. However if having too much symptoms advise returning here in the meanwhile if it takes a few days to get into orthopedics.

## 2014-11-18 NOTE — Telephone Encounter (Signed)
i have called and spoke with pt and she is aware of Dr. Danise Mina.  She is aware of referral placed for ortho eval.  She is aware to return here if she feels she needs to be seen before her appt can be set up with ortho.  Pt voiced her understanding. Nothing further is needed.

## 2014-12-03 ENCOUNTER — Telehealth: Payer: Self-pay

## 2014-12-03 NOTE — Telephone Encounter (Signed)
[facility] asked the patient to have her PCP giving her something for inflammation in her foot. Stated that [facility] did not feel comfortable giving her something for this.  She uses the [facility] on [address].

## 2014-12-05 NOTE — Telephone Encounter (Signed)
Any suggestions, Dr Alwyn Ren. I feel ortho should write something for pt.

## 2014-12-05 NOTE — Telephone Encounter (Signed)
Patient was seen 6 weeks ago. She needs to be seen here again before we can prescribe her anything.

## 2014-12-06 ENCOUNTER — Ambulatory Visit (INDEPENDENT_AMBULATORY_CARE_PROVIDER_SITE_OTHER): Payer: BC Managed Care – PPO | Admitting: Physician Assistant

## 2014-12-06 VITALS — BP 122/64 | HR 93 | Temp 98.4°F | Resp 18

## 2014-12-06 DIAGNOSIS — M25571 Pain in right ankle and joints of right foot: Secondary | ICD-10-CM | POA: Diagnosis not present

## 2014-12-06 LAB — BASIC METABOLIC PANEL
BUN: 14 mg/dL (ref 7–25)
CHLORIDE: 104 mmol/L (ref 98–110)
CO2: 27 mmol/L (ref 20–31)
CREATININE: 0.77 mg/dL (ref 0.50–1.05)
Calcium: 9.5 mg/dL (ref 8.6–10.4)
Glucose, Bld: 94 mg/dL (ref 65–99)
POTASSIUM: 4.2 mmol/L (ref 3.5–5.3)
Sodium: 141 mmol/L (ref 135–146)

## 2014-12-06 MED ORDER — MELOXICAM 7.5 MG PO TABS: 7.5000 mg | ORAL_TABLET | Freq: Every day | ORAL | Status: DC

## 2014-12-06 NOTE — Progress Notes (Signed)
   12/08/2014 at 1:06 PM  Mackenzie Everett / DOB: 1960-05-02 / MRN: [id number]  The patient has Obesity, Class III, BMI 40-49.9 (morbid obesity); Hyperlipidemia; Migraine variant; Asthma with allergic rhinitis; and Degenerative disc disease, lumbar on her problem list.  SUBJECTIVE  Mackenzie Everett is a 54 y.o. well appearing female presenting for the chief complaint of needing a prescription for an NSAID. She is being seen by orthopedics for any ankle problem and the orthopod was hesitant to provide a NSAID due to her history of HTN.  She denies a history of kidney disease and PUD.      She  has a past medical history of Allergy; Arthritis; Asthma; Hyperlipidemia (09/04/2011); and Hypertension.    Medications reviewed and updated by myself where necessary, and exist elsewhere in the encounter.   [name] is allergic to caffeine and penicillins. She  reports that she has never smoked. She has never used smokeless tobacco. She reports that she drinks alcohol. She reports that she does not use illicit drugs. She  reports that she does not engage in sexual activity. The patient  has past surgical history that includes Appendectomy; Breast surgery; Tubal ligation; and Knee surgery.  Her family history includes Arthritis in her mother; Cancer in her maternal aunt; Diabetes in her maternal grandmother and mother; Heart disease in her maternal grandfather and paternal grandfather; Hyperlipidemia in her mother and sister; Hypertension in her mother and sister; Lupus in her sister.  Review of Systems  Constitutional: Negative for fever and chills.  Respiratory: Negative for shortness of breath.   Cardiovascular: Negative for chest pain.  Gastrointestinal: Negative for nausea and abdominal pain.  Genitourinary: Negative.   Skin: Negative for rash.  Neurological: Negative for dizziness and headaches.    OBJECTIVE  Her  temperature is 98.4 F (36.9 C). Her blood pressure is 122/64 and her pulse is 93. Her  respiration is 18 and oxygen saturation is 98%.  The patient's body mass index is unknown because there is no weight on file.  Physical Exam  Constitutional: She is oriented to person, place, and time. She appears well-developed and well-nourished. No distress.  Eyes: Pupils are equal, round, and reactive to light.  Cardiovascular: Normal rate.   Respiratory: Effort normal. No respiratory distress.  GI: She exhibits no distension.  Neurological: She is alert and oriented to person, place, and time.  Skin: Skin is warm and dry. She is not diaphoretic.  Psychiatric: She has a normal mood and affect. Her behavior is normal. Judgment and thought content normal.    No results found for this or any previous visit (from the past 24 hour(s)).  ASSESSMENT & PLAN  Mackenzie Everett was seen today for follow-up.  Diagnoses and all orders for this visit:  Pain in joint, ankle and foot, right: Her BP is well controlled. Her kidneys are healthy per labs.   She has no history of PUD.   -     meloxicam (MOBIC) 7.5 MG tablet; Take 1 tablet (7.5 mg total) by mouth daily. -     Basic metabolic panel    The patient was advised to call or come back to clinic if she does not see an improvement in symptoms, or worsens with the above plan.   Deliah Boston, MHS, PA-C [facility] 12/08/2014 1:06 PM

## 2014-12-06 NOTE — Telephone Encounter (Signed)
Spoke with pt, she will RTC. 

## 2014-12-08 ENCOUNTER — Encounter: Payer: BC Managed Care – PPO | Admitting: Physician Assistant

## 2014-12-20 ENCOUNTER — Encounter: Payer: Self-pay | Admitting: Physician Assistant

## 2014-12-20 ENCOUNTER — Ambulatory Visit (INDEPENDENT_AMBULATORY_CARE_PROVIDER_SITE_OTHER): Payer: BC Managed Care – PPO | Admitting: Physician Assistant

## 2014-12-20 VITALS — BP 119/77 | HR 85 | Temp 98.6°F | Resp 16 | Ht 70.0 in | Wt 326.0 lb

## 2014-12-20 DIAGNOSIS — Z23 Encounter for immunization: Secondary | ICD-10-CM | POA: Diagnosis not present

## 2014-12-20 DIAGNOSIS — Z0000 Encounter for general adult medical examination without abnormal findings: Secondary | ICD-10-CM

## 2014-12-20 DIAGNOSIS — Z124 Encounter for screening for malignant neoplasm of cervix: Secondary | ICD-10-CM | POA: Diagnosis not present

## 2014-12-20 DIAGNOSIS — Z1211 Encounter for screening for malignant neoplasm of colon: Secondary | ICD-10-CM

## 2014-12-20 DIAGNOSIS — Z130 Encounter for screening for diseases of the blood and blood-forming organs and certain disorders involving the immune mechanism: Secondary | ICD-10-CM

## 2014-12-20 DIAGNOSIS — Z131 Encounter for screening for diabetes mellitus: Secondary | ICD-10-CM | POA: Diagnosis not present

## 2014-12-20 DIAGNOSIS — E785 Hyperlipidemia, unspecified: Secondary | ICD-10-CM | POA: Diagnosis not present

## 2014-12-20 LAB — CBC
HEMATOCRIT: 38.1 % (ref 36.0–46.0)
Hemoglobin: 12.4 g/dL (ref 12.0–15.0)
MCH: 25.5 pg — ABNORMAL LOW (ref 26.0–34.0)
MCHC: 32.5 g/dL (ref 30.0–36.0)
MCV: 78.2 fL (ref 78.0–100.0)
MPV: 9.3 fL (ref 8.6–12.4)
Platelets: 309 10*3/uL (ref 150–400)
RBC: 4.87 MIL/uL (ref 3.87–5.11)
RDW: 15.5 % (ref 11.5–15.5)
WBC: 8.4 10*3/uL (ref 4.0–10.5)

## 2014-12-20 LAB — POCT URINALYSIS DIPSTICK
Bilirubin, UA: NEGATIVE
Glucose, UA: NEGATIVE
KETONES UA: NEGATIVE
Leukocytes, UA: NEGATIVE
Nitrite, UA: NEGATIVE
PH UA: 5.5
PROTEIN UA: 30
SPEC GRAV UA: 1.025
Urobilinogen, UA: 0.2

## 2014-12-20 LAB — LIPID PANEL
CHOL/HDL RATIO: 2.9 ratio (ref <=5.0)
Cholesterol: 184 mg/dL (ref 125–200)
HDL: 64 mg/dL (ref >=46)
LDL CALC: 101 mg/dL (ref <130)
Triglycerides: 97 mg/dL (ref <150)
VLDL: 19 mg/dL (ref <30)

## 2014-12-20 NOTE — Patient Instructions (Addendum)
DASH Eating Plan DASH stands for "Dietary Approaches to Stop Hypertension." The DASH eating plan is a healthy eating plan that has been shown to reduce high blood pressure (hypertension). Additional health benefits may include reducing the risk of type 2 diabetes mellitus, heart disease, and stroke. The DASH eating plan may also help with weight loss. WHAT DO I NEED TO KNOW ABOUT THE DASH EATING PLAN? For the DASH eating plan, you will follow these general guidelines:  Choose foods with a percent daily value for sodium of less than 5% (as listed on the food label).  Use salt-free seasonings or herbs instead of table salt or sea salt.  Check with your health care Mackenzie Everett or pharmacist before using salt substitutes.  Eat lower-sodium products, often labeled as "lower sodium" or "no salt added."  Eat fresh foods.  Eat more vegetables, fruits, and low-fat dairy products.  Choose whole grains. Look for the word "whole" as the first word in the ingredient list.  Choose fish and skinless chicken or turkey more often than red meat. Limit fish, poultry, and meat to 6 oz (170 g) each day.  Limit sweets, desserts, sugars, and sugary drinks.  Choose heart-healthy fats.  Limit cheese to 1 oz (28 g) per day.  Eat more home-cooked food and less restaurant, buffet, and fast food.  Limit fried foods.  Cook foods using methods other than frying.  Limit canned vegetables. If you do use them, rinse them well to decrease the sodium.  When eating at a restaurant, ask that your food be prepared with less salt, or no salt if possible. WHAT FOODS CAN I EAT? Seek help from a dietitian for individual calorie needs. Grains Whole grain or whole wheat bread. Brown rice. Whole grain or whole wheat pasta. Quinoa, bulgur, and whole grain cereals. Low-sodium cereals. Corn or whole wheat flour tortillas. Whole grain cornbread. Whole grain crackers. Low-sodium crackers. Vegetables Fresh or frozen vegetables  (raw, steamed, roasted, or grilled). Low-sodium or reduced-sodium tomato and vegetable juices. Low-sodium or reduced-sodium tomato sauce and paste. Low-sodium or reduced-sodium canned vegetables.  Fruits All fresh, canned (in natural juice), or frozen fruits. Meat and Other Protein Products Ground beef (85% or leaner), grass-fed beef, or beef trimmed of fat. Skinless chicken or turkey. Ground chicken or turkey. Pork trimmed of fat. All fish and seafood. Eggs. Dried beans, peas, or lentils. Unsalted nuts and seeds. Unsalted canned beans. Dairy Low-fat dairy products, such as skim or 1% milk, 2% or reduced-fat cheeses, low-fat ricotta or cottage cheese, or plain low-fat yogurt. Low-sodium or reduced-sodium cheeses. Fats and Oils Tub margarines without trans fats. Light or reduced-fat mayonnaise and salad dressings (reduced sodium). Avocado. Safflower, olive, or canola oils. Natural peanut or almond butter. Other Unsalted popcorn and pretzels. The items listed above may not be a complete list of recommended foods or beverages. Contact your dietitian for more options. WHAT FOODS ARE NOT RECOMMENDED? Grains White bread. White pasta. White rice. Refined cornbread. Bagels and croissants. Crackers that contain trans fat. Vegetables Creamed or fried vegetables. Vegetables in a cheese sauce. Regular canned vegetables. Regular canned tomato sauce and paste. Regular tomato and vegetable juices. Fruits Dried fruits. Canned fruit in light or heavy syrup. Fruit juice. Meat and Other Protein Products Fatty cuts of meat. Ribs, chicken wings, bacon, sausage, bologna, salami, chitterlings, fatback, hot dogs, bratwurst, and packaged luncheon meats. Salted nuts and seeds. Canned beans with salt. Dairy Whole or 2% milk, cream, half-and-half, and cream cheese. Whole-fat or sweetened yogurt. Full-fat   cheeses or blue cheese. Nondairy creamers and whipped toppings. Processed cheese, cheese spreads, or cheese  curds. Condiments Onion and garlic salt, seasoned salt, table salt, and sea salt. Canned and packaged gravies. Worcestershire sauce. Tartar sauce. Barbecue sauce. Teriyaki sauce. Soy sauce, including reduced sodium. Steak sauce. Fish sauce. Oyster sauce. Cocktail sauce. Horseradish. Ketchup and mustard. Meat flavorings and tenderizers. Bouillon cubes. Hot sauce. Tabasco sauce. Marinades. Taco seasonings. Relishes. Fats and Oils Butter, stick margarine, lard, shortening, ghee, and bacon fat. Coconut, palm kernel, or palm oils. Regular salad dressings. Other Pickles and olives. Salted popcorn and pretzels. The items listed above may not be a complete list of foods and beverages to avoid. Contact your dietitian for more information. WHERE CAN I FIND MORE INFORMATION? National Heart, Lung, and Blood Institute: CablePromo.it Document Released: 03/07/2011 Document Revised: 08/02/2013 Document Reviewed: 01/20/2013 [facility] Patient Information 2015 [place], [place]. This information is not intended to replace advice given to you by your health care Mackenzie Everett. Make sure you discuss any questions you have with your health care Mackenzie Everett.   Keeping You Healthy  Get These Tests 1. Blood Pressure- Have your blood pressure checked once a year by your health care Mackenzie Everett.  Normal blood pressure is 120/80. 2. Weight- Have your body mass index (BMI) calculated to screen for obesity.  BMI is measure of body fat based on height and weight.  You can also calculate your own BMI at https://www.Bodi-esparza.com/. 3. Cholesterol- Have your cholesterol checked every 5 years starting at age 8 then yearly starting at age 34. 4. Chlamydia, HIV, and other sexually transmitted diseases- Get screened every year until age 41, then within three months of each new sexual Mackenzie Everett. 5. Pap Test - Every 1-5 years; discuss with your health care Mackenzie Everett. 6. Mammogram- Every 1-2 years starting at age  56--50  Take these medicines  Calcium with Vitamin D-Your body needs 1200 mg of Calcium each day and [phone] IU of Vitamin D daily.  Your body can only absorb 500 mg of Calcium at a time so Calcium must be taken in 2 or 3 divided doses throughout the day.  Multivitamin with folic acid- Once daily if it is possible for you to become pregnant.  Get these Immunizations  Gardasil-Series of three doses; prevents HPV related illness such as genital warts and cervical cancer.  Menactra-Single dose; prevents meningitis.  Tetanus shot- Every 10 years.  Flu shot-Every year.  Take these steps 1. Do not smoke-Your healthcare Mackenzie Everett can help you quit.  For tips on how to quit go to www.smokefree.gov or call 1-800 QUITNOW. 2. Be physically active- Exercise 5 days a week for at least 30 minutes.  If you are not already physically active, start slow and gradually work up to 30 minutes of moderate physical activity.  Examples of moderate activity include walking briskly, dancing, swimming, bicycling, etc. 3. Breast Cancer- A self breast exam every month is important for early detection of breast cancer.  For more information and instruction on self breast exams, ask your healthcare Mackenzie Everett or Mackenzie Everett. 4. Eat a healthy diet- Eat a variety of healthy foods such as fruits, vegetables, whole grains, low fat milk, low fat cheeses, yogurt, lean meats, poultry and fish, beans, nuts, tofu, etc.  For more information go to www. Thenutritionsource.org 5. Drink alcohol in moderation- Limit alcohol intake to one drink or less per day. Never drink and drive. 6. Depression- Your emotional health is as important as your physical health.  If you're feeling down or  losing interest in things you normally enjoy please talk to your healthcare Mackenzie Everett about being screened for depression. 7. Dental visit- Brush and floss your teeth twice daily; visit your dentist twice a year. 8. Eye  doctor- Get an eye exam at least every 2 years. 9. Helmet use- Always wear a helmet when riding a bicycle, motorcycle, rollerblading or skateboarding. 10. Safe sex- If you may be exposed to sexually transmitted infections, use a condom. 11. Seat belts- Seat belts can save your live; always wear one. 12. Smoke/Carbon Monoxide detectors- These detectors need to be installed on the appropriate level of your home. Replace batteries at least once a year. 13. Skin cancer- When out in the sun please cover up and use sunscreen 15 SPF or higher. 14. Violence- If anyone is threatening or hurting you, please tell your healthcare Mackenzie Everett.

## 2014-12-20 NOTE — Progress Notes (Signed)
[facility] [address] [place] [address] [phone]  Date:  12/20/2014   Name:  Mackenzie Everett   DOB:  1961/01/30   MRN:  [id number]  PCP:  Janace Hoard, MD    History of Present Illness:  Mackenzie Everett is a 54 y.o. female patient who presents to [facility] for annual physical exam.     Diet: Slowed down, but has no dietary restrictions.  Lowering portions within the last couple weeks.  Limit carbonated beverages.  Drinks juice but follows with water 4 bottles of water per day.  BM: Normal, no blood in the stool, mild constipation intermittently, no diarrhea.    Overactive bladder.  Some incontinence, but holds her urine a lot.  Sleep: Sleeping pretty good, may have trouble staying asleep at times.  Exercise: Started last week, aquatic center 2x per week.  Trying tues, wed, thurs.  Social: Friends hanging out  1-2 drinks per month, no illic drug use, no tobacco use (never)  Married, sexually active, good relationship.   Patient Active Problem List   Diagnosis Date Noted  . Obesity, Class III, BMI 40-49.9 (morbid obesity) 09/04/2011  . Hyperlipidemia 09/04/2011  . Migraine variant 09/04/2011  . Asthma with allergic rhinitis 09/04/2011  . Degenerative disc disease, lumbar 09/04/2011    Past Medical History  Diagnosis Date  . Allergy   . Arthritis     hands and knees  . Asthma   . Hyperlipidemia 09/04/2011  . Hypertension     Past Surgical History  Procedure Laterality Date  . Appendectomy    . Breast surgery    . Tubal ligation    . Knee surgery      1994    Social History  Substance Use Topics  . Smoking status: Never Smoker   . Smokeless tobacco: Never Used  . Alcohol Use: Yes     Comment: social    Family History  Problem Relation Age of Onset  . Arthritis Mother   . Diabetes Mother   . Hyperlipidemia Mother   . Hypertension Mother   . Hyperlipidemia Sister   . Hypertension Sister   . Lupus Sister   . Cancer Maternal Aunt    . Diabetes Maternal Grandmother   . Heart disease Maternal Grandfather   . Heart disease Paternal Grandfather     Allergies  Allergen Reactions  . Caffeine Other (See Comments)    migraines  . Penicillins Hives    Medication list has been reviewed and updated.  Current Outpatient Prescriptions on File Prior to Visit  Medication Sig Dispense Refill  . amLODipine (NORVASC) 5 MG tablet Take 1 tablet (5 mg total) by mouth daily. 30 tablet 5  . cetirizine (ZYRTEC) 10 MG tablet Take 1 tablet (10 mg total) by mouth daily. 30 tablet 0  . desloratadine (CLARINEX) 5 MG tablet Take 1 tablet (5 mg total) by mouth daily. 30 tablet 5  . fluticasone (FLONASE) 50 MCG/ACT nasal spray Place 2 sprays into both nostrils daily. 16 g 6  . hydrochlorothiazide (HYDRODIURIL) 12.5 MG tablet Take 1 tablet (12.5 mg total) by mouth daily. 60 tablet 3  . meloxicam (MOBIC) 7.5 MG tablet Take 1 tablet (7.5 mg total) by mouth daily. 30 tablet 0   No current facility-administered medications on file prior to visit.    Review of Systems  Constitutional: Negative for fever and chills.  HENT: Negative for congestion, ear pain and sore throat.   Eyes: Negative for blurred  vision.  Respiratory: Negative for cough, shortness of breath and wheezing.   Cardiovascular: Negative for chest pain and palpitations.  Gastrointestinal: Negative for nausea, vomiting, abdominal pain, diarrhea, constipation and blood in stool.  Genitourinary: Negative for dysuria and hematuria.  Musculoskeletal: Negative for joint pain.  Skin: Negative for rash.  Neurological: Negative for dizziness.     Physical Examination: BP 119/77 mmHg  Pulse 85  Temp(Src) 98.6 F (37 C)  Resp 16  Ht  (1.778 m)  Wt 326 lb (147.873 kg)  BMI 46.78 kg/m2 Ideal Body Weight: Weight in (lb) to have BMI = 25: 173.9 Wt Readings from Last 3 Encounters:  12/20/14 326 lb (147.873 kg)  10/18/14 323 lb (146.512 kg)  09/01/14 314 lb 12.8 oz (142.792  kg)    Physical Exam  Constitutional: She is oriented to person, place, and time. She appears well-developed and well-nourished. No distress.  HENT:  Head: Normocephalic and atraumatic.  Right Ear: Tympanic membrane, external ear and ear canal normal.  Left Ear: Tympanic membrane, external ear and ear canal normal.  Eyes: Conjunctivae and EOM are normal. Pupils are equal, round, and reactive to light.  Cardiovascular: Normal rate.   Pulmonary/Chest: Effort normal. No respiratory distress.  Abdominal: Soft. Bowel sounds are normal. She exhibits no distension and no mass. There is no hepatosplenomegaly. There is no tenderness. There is no CVA tenderness, no tenderness at McBurney's point and negative Murphy's sign.  Neurological: She is alert and oriented to person, place, and time.  Skin: She is not diaphoretic.  Psychiatric: She has a normal mood and affect. Her behavior is normal.     Assessment and Plan: 54 year old female is here today for annual physical exam.  Non-fasting--lunch 3 hours ago. -Colonoscopy ordered at this time. -Flu vaccine given -TDAP given  -Pap performed today. -Lipid panel and Hemoglobin A1C.  BMET and CMET normal within the last year -Encouraged exercise regimen that she is newly participating in. -Given Dash diet plan  Annual physical exam - Plan: Flu Vaccine QUAD 36+ mos IM (Fluarix), Ambulatory referral to Gastroenterology, Tdap vaccine greater than or equal to 7yo IM, Lipid panel, POCT urinalysis dipstick, Pap IG w/ reflex to HPV when ASC-U, Hemoglobin A1c, CBC  Need for immunization against influenza - Plan: Flu Vaccine QUAD 36+ mos IM (Fluarix)  Need for Tdap vaccination - Plan: Tdap vaccine greater than or equal to 7yo IM  Screening for colon cancer - Plan: Ambulatory referral to Gastroenterology  Screening for cervical cancer - Plan: Pap IG w/ reflex to HPV when ASC-U  Screening for diabetes mellitus - Plan: POCT urinalysis dipstick, Hemoglobin  A1c  Hyperlipidemia - Plan: Lipid panel  Screening for deficiency anemia - Plan: CBC  Trena Platt, PA-C [facility] 12/20/2014 2:58 PM

## 2014-12-21 LAB — HEMOGLOBIN A1C
Hgb A1c MFr Bld: 6.1 % — ABNORMAL HIGH (ref <5.7)
Mean Plasma Glucose: 128 mg/dL — ABNORMAL HIGH (ref <117)

## 2014-12-22 LAB — PAP IG W/ RFLX HPV ASCU

## 2014-12-23 ENCOUNTER — Other Ambulatory Visit: Payer: Self-pay | Admitting: Physician Assistant

## 2014-12-23 DIAGNOSIS — B379 Candidiasis, unspecified: Secondary | ICD-10-CM

## 2014-12-23 MED ORDER — FLUCONAZOLE 150 MG PO TABS: 150.0000 mg | ORAL_TABLET | Freq: Once | ORAL | Status: DC

## 2015-01-03 ENCOUNTER — Other Ambulatory Visit: Payer: Self-pay | Admitting: Physician Assistant

## 2015-01-06 NOTE — Telephone Encounter (Signed)
Michael, do you want to give RFs? 

## 2015-01-23 IMAGING — CR DG FOOT COMPLETE 3+V*L*
2 series · 2 of 2 positions shown · non-contrast
Comparison: None.

CLINICAL DATA: 52-year-old female with pain.

LEFT FOOT - COMPLETE 3+ VIEW

[AP]
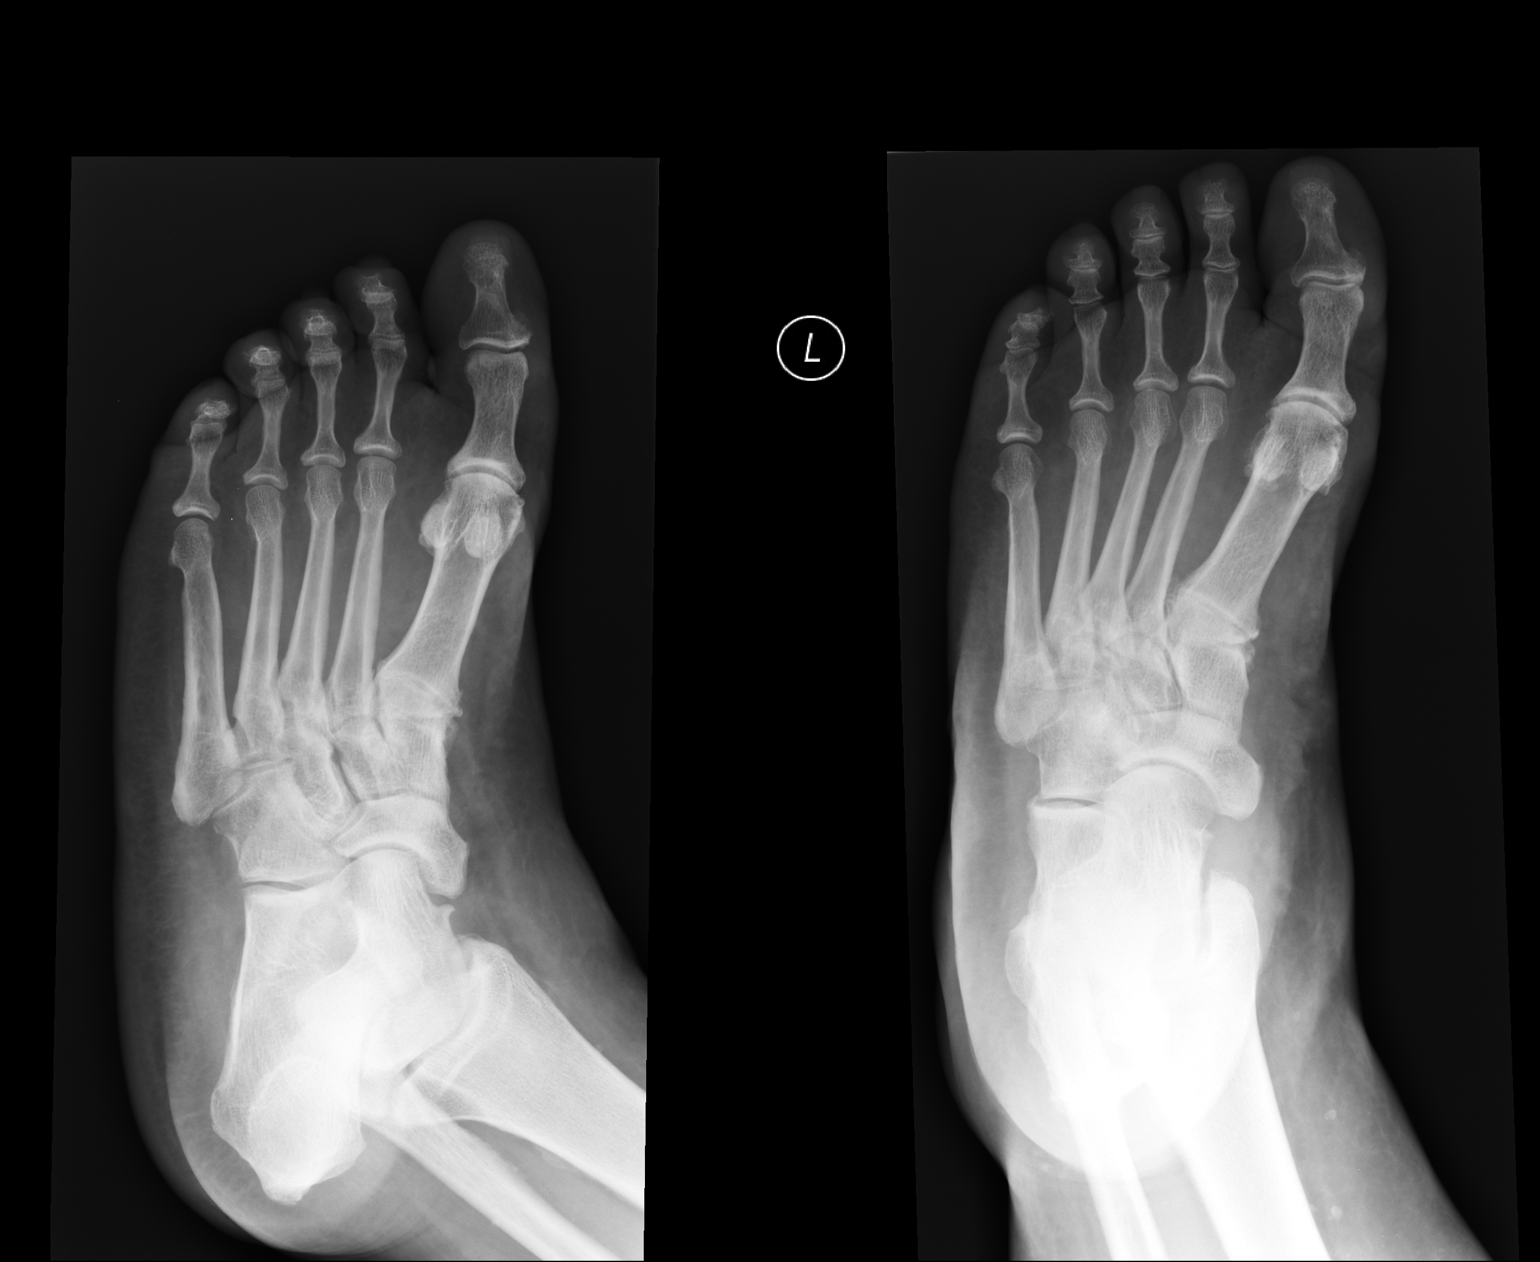

[ap obl int rot]
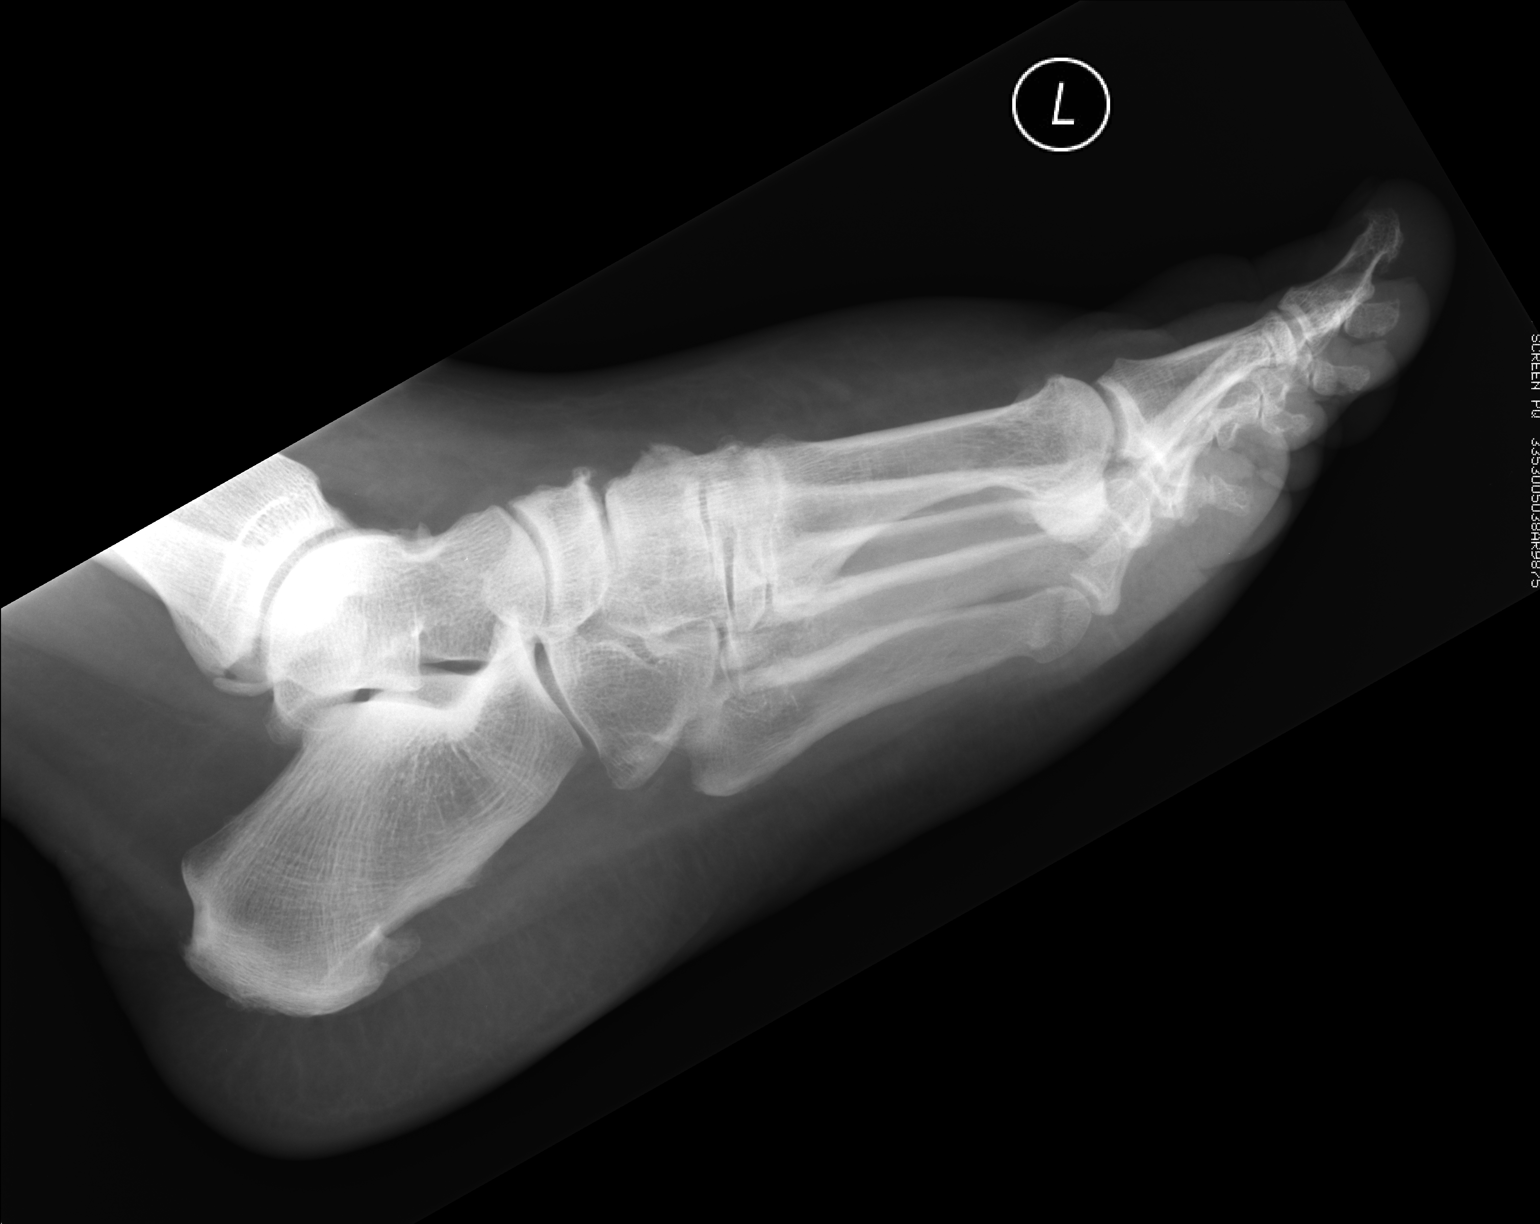

[2 of 2 positions shown; findings below may reference images not displayed]

FINDINGS: Calcaneus intact with degenerative spurring.  Multi focal
midfoot degenerative spurring.  Evidence of generalized soft tissue
swelling.  No subcutaneous gas. No radiopaque foreign body
identified.  No fracture or dislocation.  Calcified phleboliths
about the left tib-fib.
IMPRESSION: No acute fracture or dislocation identified about the left foot.
Degenerative changes, especially at the mid foot.

Clinically significant discrepancy from primary report, if
provided: None

## 2015-02-01 ENCOUNTER — Other Ambulatory Visit: Payer: Self-pay

## 2015-02-01 MED ORDER — DESLORATADINE 5 MG PO TABS: 5.0000 mg | ORAL_TABLET | Freq: Every day | ORAL | Status: DC

## 2015-03-29 ENCOUNTER — Other Ambulatory Visit: Payer: Self-pay

## 2015-03-29 DIAGNOSIS — I10 Essential (primary) hypertension: Secondary | ICD-10-CM

## 2015-03-29 MED ORDER — AMLODIPINE BESYLATE 5 MG PO TABS
5.0000 mg | ORAL_TABLET | Freq: Every day | ORAL | Status: DC
Start: 2015-03-29 — End: 2015-10-06

## 2015-05-12 ENCOUNTER — Ambulatory Visit (INDEPENDENT_AMBULATORY_CARE_PROVIDER_SITE_OTHER): Payer: BC Managed Care – PPO | Admitting: Family Medicine

## 2015-05-12 VITALS — BP 154/88 | HR 99 | Temp 99.9°F | Resp 18 | Ht 70.0 in | Wt 311.0 lb

## 2015-05-12 DIAGNOSIS — R05 Cough: Secondary | ICD-10-CM

## 2015-05-12 DIAGNOSIS — R059 Cough, unspecified: Secondary | ICD-10-CM

## 2015-05-12 DIAGNOSIS — J0100 Acute maxillary sinusitis, unspecified: Secondary | ICD-10-CM | POA: Diagnosis not present

## 2015-05-12 MED ORDER — LEVOFLOXACIN 500 MG PO TABS: 500.0000 mg | ORAL_TABLET | Freq: Every day | ORAL | Status: DC

## 2015-05-12 MED ORDER — HYDROCODONE-HOMATROPINE 5-1.5 MG/5ML PO SYRP: 5.0000 mL | ORAL_SOLUTION | Freq: Three times a day (TID) | ORAL | Status: DC | PRN

## 2015-05-12 NOTE — Progress Notes (Signed)
 @  By signing my name below, I, Raven Small, attest that this documentation has been prepared under the direction and in the presence of Elvina Sidle, MD.  Electronically Signed: Andrew Au, ED Scribe. 05/12/2015. 12:14 PM.  Patient ID: Mackenzie Everett MRN: [id number], DOB: December 20, 1960, 55 y.o. Date of Encounter: 05/12/2015, 12:11 PM  Primary Physician: Janace Hoard, MD  Chief Complaint:  Chief Complaint  Patient presents with  . Fever  . Cough    Chest pain when coughing, productive  . Nausea  . Headache    HPI: 55 y.o. year old female with history below presents with a persistent fever for 4 days. Pt states symptoms started with 101.4 fever 4 days ago. States fever has persisted, waking up with a 101 fever this morning. She reports associated cough, chest pain due to cough, and HA. She has been taking coricidin cold and flu medication without relief to symptoms. She has been out of work for 3 days due to cough.  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   Past Medical History  Diagnosis Date  . Allergy   . Arthritis     hands and knees  . Asthma   . Hyperlipidemia 09/04/2011  . Hypertension      Home Meds: Prior to Admission medications   Medication Sig Start Date End Date Taking? Authorizing Provider  amLODipine (NORVASC) 5 MG tablet Take 1 tablet (5 mg total) by mouth daily. 03/29/15  Yes Collie Siad English, PA  desloratadine (CLARINEX) 5 MG tablet Take 1 tablet (5 mg total) by mouth daily. 02/01/15  Yes Stephanie D English, PA  fluticasone (FLONASE) 50 MCG/ACT nasal spray Place 2 sprays into both nostrils daily. 04/14/14  Yes Andrena Mews, DO  hydrochlorothiazide (HYDRODIURIL) 12.5 MG tablet Take 1 tablet (12.5 mg total) by mouth daily. 07/29/14  Yes Maurice March, MD  cetirizine (ZYRTEC) 10 MG tablet Take 1 tablet (10 mg total) by mouth daily. Patient not taking: Reported on 05/12/2015 09/01/14   Tishira R Brewington, PA-C  fluconazole (DIFLUCAN) 150 MG  tablet Take 1 tablet (150 mg total) by mouth once. Repeat if needed Patient not taking: Reported on 05/12/2015 12/23/14   Collie Siad English, PA  meloxicam (MOBIC) 7.5 MG tablet TAKE 1 TABLET(7.5 MG) BY MOUTH DAILY Patient not taking: Reported on 05/12/2015 01/06/15   Ofilia Neas, PA-C    Allergies:  Allergies  Allergen Reactions  . Caffeine Other (See Comments)    migraines  . Penicillins Hives    Social History   Social History  . Marital Status: Single    Spouse Name: N/A  . Number of Children: N/A  . Years of Education: N/A   Occupational History  . Not on file.   Social History Main Topics  . Smoking status: Never Smoker   . Smokeless tobacco: Never Used  . Alcohol Use: Yes     Comment: social  . Drug Use: No  . Sexual Activity: Yes    Birth Control/ Protection: None   Other Topics Concern  . Not on file   Social History Narrative   Lives at home alone     Review of Systems: Constitutional: negative for  night sweats, weight changes, or fatigue  HEENT: negative for vision changes, hearing loss, congestion, rhinorrhea, ST, epistaxis, or sinus pressure Cardiovascular: negative for chest pain or palpitations Respiratory: negative for hemoptysis, wheezing, shortness of breath Abdominal: negative for abdominal pain, nausea, vomiting, diarrhea, or constipation Dermatological: negative for rash  Neurologic: negative for headache, dizziness, or syncope All other systems reviewed and are otherwise negative with the exception to those above and in the HPI.   Physical Exam: Blood pressure 154/88, pulse 99, temperature 99.9 F (37.7 C), temperature source Oral, resp. rate 18, height  (1.778 m), weight 311 lb (141.069 kg), SpO2 95 %., Body mass index is 44.62 kg/(m^2). General: Well developed, well nourished, in no acute distress. Head: Normocephalic, atraumatic, eyes without discharge, sclera non-icteric, nares are without discharge. Mucosal nasal discharge  bilaterally.   Neck: Supple. No thyromegaly. Full ROM. No lymphadenopathy. Lungs: Clear bilaterally to auscultation without wheezes, rales. Few  Rhonchi in bilateral bases. . Breathing is unlabored. Heart: RRR with S1 S2. No murmurs, rubs, or gallops appreciated. Msk:  Strength and tone normal for age. Extremities/Skin: Warm and dry. No clubbing or cyanosis. No edema. No rashes or suspicious lesions. Neuro: Alert and oriented X 3. Moves all extremities spontaneously. Gait is normal. CNII-XII grossly in tact. Psych:  Responds to questions appropriately with a normal affect.    ASSESSMENT AND PLAN:  55 y.o. year old female with  This chart was scribed in my presence and reviewed by me personally.    ICD-9-CM ICD-10-CM   1. Acute maxillary sinusitis, recurrence not specified 461.0 J01.00 levofloxacin (LEVAQUIN) 500 MG tablet  2. Cough 786.2 R05 HYDROcodone-homatropine (HYCODAN) 5-1.5 MG/5ML syrup     Signed, Elvina Sidle, MD    Signed, Elvina Sidle, MD 05/12/2015 12:11 PM

## 2015-05-12 NOTE — Patient Instructions (Signed)

## 2015-06-01 ENCOUNTER — Ambulatory Visit (INDEPENDENT_AMBULATORY_CARE_PROVIDER_SITE_OTHER): Payer: BC Managed Care – PPO | Admitting: Family Medicine

## 2015-06-01 VITALS — BP 137/81 | HR 87 | Temp 98.6°F | Resp 17 | Ht 70.0 in | Wt 321.0 lb

## 2015-06-01 DIAGNOSIS — G5603 Carpal tunnel syndrome, bilateral upper limbs: Secondary | ICD-10-CM

## 2015-06-01 DIAGNOSIS — M7712 Lateral epicondylitis, left elbow: Secondary | ICD-10-CM | POA: Diagnosis not present

## 2015-06-01 MED ORDER — DICLOFENAC SODIUM 1 % TD GEL: 2.0000 g | Freq: Four times a day (QID) | TRANSDERMAL | Status: DC

## 2015-06-01 NOTE — Progress Notes (Signed)
Subjective:    Patient ID: Mackenzie Everett, female    DOB: 1960/07/18, 55 y.o.   MRN: [id number]  06/01/2015  arm weakness and hand numbness   HPI This 55 y.o. female presents for evaluation of B finger numbness/tingling.  Thumb numbness is moderate to severe. Writes a lot at work.  Hard to curl hair due to numbness in thumb. Yesterday at work while writing, fingers get numb.  Nighttime awakening.  Onset few weeks ago. Had the flu so did not focus on finger numbness.  No neck pain.  Same job for ten years; Neurosurgeon in school.  No increase work.  Works overtime but chronic.    L elbow pain: sharp pain; was B pain; now outer aspect of elbow.  Unable to extend or flex elbow.  Unable to take Ibuprofen because takes diuretic.  Has applied creams; no improvement.  Interfering with sleep.  Did rub Diclofenac gel to elbow without improvement.   Review of Systems  Constitutional: Negative for fever, chills, diaphoresis and fatigue.  Eyes: Negative for visual disturbance.  Respiratory: Negative for cough and shortness of breath.   Cardiovascular: Negative for chest pain, palpitations and leg swelling.  Gastrointestinal: Negative for nausea, vomiting, abdominal pain, diarrhea and constipation.  Endocrine: Negative for cold intolerance, heat intolerance, polydipsia, polyphagia and polyuria.  Neurological: Negative for dizziness, tremors, seizures, syncope, facial asymmetry, speech difficulty, weakness, light-headedness, numbness and headaches.    Past Medical History  Diagnosis Date  . Allergy   . Arthritis     hands and knees  . Asthma   . Hyperlipidemia 09/04/2011  . Hypertension    Past Surgical History  Procedure Laterality Date  . Appendectomy    . Breast surgery    . Tubal ligation    . Knee surgery      1994   Allergies  Allergen Reactions  . Caffeine Other (See Comments)    migraines  . Penicillins Hives    Social History   Social History  . Marital Status: Single   Spouse Name: N/A  . Number of Children: N/A  . Years of Education: N/A   Occupational History  . Not on file.   Social History Main Topics  . Smoking status: Never Smoker   . Smokeless tobacco: Never Used  . Alcohol Use: Yes     Comment: social  . Drug Use: No  . Sexual Activity: Yes    Birth Control/ Protection: None   Other Topics Concern  . Not on file   Social History Narrative   Lives at home alone   Family History  Problem Relation Age of Onset  . Arthritis Mother   . Diabetes Mother   . Hyperlipidemia Mother   . Hypertension Mother   . Hyperlipidemia Sister   . Hypertension Sister   . Lupus Sister   . Cancer Maternal Aunt   . Diabetes Maternal Grandmother   . Heart disease Maternal Grandfather   . Heart disease Paternal Grandfather        Objective:    BP 137/81 mmHg  Pulse 87  Temp(Src) 98.6 F (37 C) (Oral)  Resp 17  Ht  (1.778 m)  Wt 321 lb (145.605 kg)  BMI 46.06 kg/m2  SpO2 98% Physical Exam  Constitutional: She is oriented to person, place, and time. She appears well-developed and well-nourished. No distress.  HENT:  Head: Normocephalic and atraumatic.  Right Ear: External ear normal.  Left Ear: External ear normal.  Nose:  Nose normal.  Mouth/Throat: Oropharynx is clear and moist.  Eyes: Conjunctivae and EOM are normal. Pupils are equal, round, and reactive to light.  Neck: Normal range of motion. Neck supple. Carotid bruit is not present. No thyromegaly present.  Cardiovascular: Normal rate, regular rhythm, normal heart sounds and intact distal pulses.  Exam reveals no gallop and no friction rub.   No murmur heard. Pulmonary/Chest: Effort normal and breath sounds normal. She has no wheezes. She has no rales.  Abdominal: Soft. Bowel sounds are normal. She exhibits no distension and no mass. There is no tenderness. There is no rebound and no guarding.  Musculoskeletal:       Left elbow: She exhibits normal range of motion. Tenderness  found. Lateral epicondyle tenderness noted. No radial head and no medial epicondyle tenderness noted.  Lymphadenopathy:    She has no cervical adenopathy.  Neurological: She is alert and oriented to person, place, and time. No cranial nerve deficit.  Skin: Skin is warm and dry. No rash noted. She is not diaphoretic. No erythema. No pallor.  Psychiatric: She has a normal mood and affect. Her behavior is normal.        Assessment & Plan:   1. Bilateral carpal tunnel syndrome   2. Lateral epicondylitis (tennis elbow), left     No orders of the defined types were placed in this encounter.   Meds ordered this encounter  Medications  . diclofenac sodium (VOLTAREN) 1 % GEL    Sig: Apply 2 g topically 4 (four) times daily.    Dispense:  100 g    Refill:  1    No Follow-up on file.    Karanveer Ramakrishnan Paulita Fujita, M.D. [facility] [address] [place], [place]  [address] [phone] phone [phone] fax

## 2015-06-01 NOTE — Patient Instructions (Signed)
Lateral Epicondylitis With Rehab Lateral epicondylitis involves inflammation and pain around the outer portion of the elbow. The pain is caused by inflammation of the tendons in the forearm that bring back (extend) the wrist. Lateral epicondylitis is also called tennis elbow, because it is very common in tennis players. However, it may occur in any individual who extends the wrist repetitively. If lateral epicondylitis is left untreated, it may become a chronic problem. SYMPTOMS   Pain, tenderness, and inflammation on the outer (lateral) side of the elbow.  Pain or weakness with gripping activities.  Pain that increases with wrist-twisting motions (playing tennis, using a screwdriver, opening a door or a jar).  Pain with lifting objects, including a coffee cup. CAUSES  Lateral epicondylitis is caused by inflammation of the tendons that extend the wrist. Causes of injury may include:  Repetitive stress and strain on the muscles and tendons that extend the wrist.  Sudden change in activity level or intensity.  Incorrect grip in racquet sports.  Incorrect grip size of racquet (often too large).  Incorrect hitting position or technique (usually backhand, leading with the elbow).  Using a racket that is too heavy. RISK INCREASES WITH:  Sports or occupations that require repetitive and/or strenuous forearm and wrist movements (tennis, squash, racquetball, carpentry).  Poor wrist and forearm strength and flexibility.  Failure to warm up properly before activity.  Resuming activity before healing, rehabilitation, and conditioning are complete. PREVENTION   Warm up and stretch properly before activity.  Maintain physical fitness:  Strength, flexibility, and endurance.  Cardiovascular fitness.  Wear and use properly fitted equipment.  Learn and use proper technique and have a coach correct improper technique.  Wear a tennis elbow (counterforce) brace. PROGNOSIS  The course of  this condition depends on the degree of the injury. If treated properly, acute cases (symptoms lasting less than 4 weeks) are often resolved in 2 to 6 weeks. Chronic (longer lasting cases) often resolve in 3 to 6 months but may require physical therapy. RELATED COMPLICATIONS   Frequently recurring symptoms, resulting in a chronic problem. Properly treating the problem the first time decreases frequency of recurrence.  Chronic inflammation, scarring tendon degeneration, and partial tendon tear, requiring surgery.  Delayed healing or resolution of symptoms. TREATMENT  Treatment first involves the use of ice and medicine to reduce pain and inflammation. Strengthening and stretching exercises may help reduce discomfort if performed regularly. These exercises may be performed at home if the condition is an acute injury. Chronic cases may require a referral to a physical therapist for evaluation and treatment. Your caregiver may advise a corticosteroid injection to help reduce inflammation. Rarely, surgery is needed. MEDICATION  If pain medicine is needed, nonsteroidal anti-inflammatory medicines (aspirin and ibuprofen), or other minor pain relievers (acetaminophen), are often advised.  Do not take pain medicine for 7 days before surgery.  Prescription pain relievers may be given, if your caregiver thinks they are needed. Use only as directed and only as much as you need.  Corticosteroid injections may be recommended. These injections should be reserved only for the most severe cases, because they can only be given a certain number of times. HEAT AND COLD  Cold treatment (icing) should be applied for 10 to 15 minutes every 2 to 3 hours for inflammation and pain, and immediately after activity that aggravates your symptoms. Use ice packs or an ice massage.  Heat treatment may be used before performing stretching and strengthening activities prescribed by your caregiver, physical therapist,   or  athletic trainer. Use a heat pack or a warm water soak. SEEK MEDICAL CARE IF: Symptoms get worse or do not improve in 2 weeks, despite treatment. EXERCISES  RANGE OF MOTION (ROM) AND STRETCHING EXERCISES - Epicondylitis, Lateral (Tennis Elbow) These exercises may help you when beginning to rehabilitate your injury. Your symptoms may go away with or without further involvement from your physician, physical therapist, or athletic trainer. While completing these exercises, remember:   Restoring tissue flexibility helps normal motion to return to the joints. This allows healthier, less painful movement and activity.  An effective stretch should be held for at least 30 seconds.  A stretch should never be painful. You should only feel a gentle lengthening or release in the stretched tissue. RANGE OF MOTION - Wrist Flexion, Active-Assisted  Extend your right / left elbow with your fingers pointing down.*  Gently pull the back of your hand towards you, until you feel a gentle stretch on the top of your forearm.  Hold this position for __________ seconds. Repeat __________ times. Complete this exercise __________ times per day.  *If directed by your physician, physical therapist or athletic trainer, complete this stretch with your elbow bent, rather than extended. RANGE OF MOTION - Wrist Extension, Active-Assisted  Extend your right / left elbow and turn your palm upwards.*  Gently pull your palm and fingertips back, so your wrist extends and your fingers point more toward the ground.  You should feel a gentle stretch on the inside of your forearm.  Hold this position for __________ seconds. Repeat __________ times. Complete this exercise __________ times per day. *If directed by your physician, physical therapist or athletic trainer, complete this stretch with your elbow bent, rather than extended. STRETCH - Wrist Flexion  Place the back of your right / left hand on a tabletop, leaving your  elbow slightly bent. Your fingers should point away from your body.  Gently press the back of your hand down onto the table by straightening your elbow. You should feel a stretch on the top of your forearm.  Hold this position for __________ seconds. Repeat __________ times. Complete this stretch __________ times per day.  STRETCH - Wrist Extension   Place your right / left fingertips on a tabletop, leaving your elbow slightly bent. Your fingers should point backwards.  Gently press your fingers and palm down onto the table by straightening your elbow. You should feel a stretch on the inside of your forearm.  Hold this position for __________ seconds. Repeat __________ times. Complete this stretch __________ times per day.  STRENGTHENING EXERCISES - Epicondylitis, Lateral (Tennis Elbow) These exercises may help you when beginning to rehabilitate your injury. They may resolve your symptoms with or without further involvement from your physician, physical therapist, or athletic trainer. While completing these exercises, remember:   Muscles can gain both the endurance and the strength needed for everyday activities through controlled exercises.  Complete these exercises as instructed by your physician, physical therapist or athletic trainer. Increase the resistance and repetitions only as guided.  You may experience muscle soreness or fatigue, but the pain or discomfort you are trying to eliminate should never worsen during these exercises. If this pain does get worse, stop and make sure you are following the directions exactly. If the pain is still present after adjustments, discontinue the exercise until you can discuss the trouble with your caregiver. STRENGTH - Wrist Flexors  Sit with your right / left forearm palm-up and fully supported   on a table or countertop. Your elbow should be resting below the height of your shoulder. Allow your wrist to extend over the edge of the  surface.  Loosely holding a __________ weight, or a piece of rubber exercise band or tubing, slowly curl your hand up toward your forearm.  Hold this position for __________ seconds. Slowly lower the wrist back to the starting position in a controlled manner. Repeat __________ times. Complete this exercise __________ times per day.  STRENGTH - Wrist Extensors  Sit with your right / left forearm palm-down and fully supported on a table or countertop. Your elbow should be resting below the height of your shoulder. Allow your wrist to extend over the edge of the surface.  Loosely holding a __________ weight, or a piece of rubber exercise band or tubing, slowly curl your hand up toward your forearm.  Hold this position for __________ seconds. Slowly lower the wrist back to the starting position in a controlled manner. Repeat __________ times. Complete this exercise __________ times per day.  STRENGTH - Ulnar Deviators  Stand with a ____________________ weight in your right / left hand, or sit while holding a rubber exercise band or tubing, with your healthy arm supported on a table or countertop.  Move your wrist, so that your pinkie travels toward your forearm and your thumb moves away from your forearm.  Hold this position for __________ seconds and then slowly lower the wrist back to the starting position. Repeat __________ times. Complete this exercise __________ times per day STRENGTH - Radial Deviators  Stand with a ____________________ weight in your right / left hand, or sit while holding a rubber exercise band or tubing, with your injured arm supported on a table or countertop.  Raise your hand upward in front of you or pull up on the rubber tubing.  Hold this position for __________ seconds and then slowly lower the wrist back to the starting position. Repeat __________ times. Complete this exercise __________ times per day. STRENGTH - Forearm Supinators   Sit with your right /  left forearm supported on a table, keeping your elbow below shoulder height. Rest your hand over the edge, palm down.  Gently grip a hammer or a soup ladle.  Without moving your elbow, slowly turn your palm and hand upward to a "thumbs-up" position.  Hold this position for __________ seconds. Slowly return to the starting position. Repeat __________ times. Complete this exercise __________ times per day.  STRENGTH - Forearm Pronators   Sit with your right / left forearm supported on a table, keeping your elbow below shoulder height. Rest your hand over the edge, palm up.  Gently grip a hammer or a soup ladle.  Without moving your elbow, slowly turn your palm and hand upward to a "thumbs-up" position.  Hold this position for __________ seconds. Slowly return to the starting position. Repeat __________ times. Complete this exercise __________ times per day.  STRENGTH - Grip  Grasp a tennis ball, a dense sponge, or a large, rolled sock in your hand.  Squeeze as hard as you can, without increasing any pain.  Hold this position for __________ seconds. Release your grip slowly. Repeat __________ times. Complete this exercise __________ times per day.  STRENGTH - Elbow Extensors, Isometric  Stand or sit upright, on a firm surface. Place your right / left arm so that your palm faces your stomach, and it is at the height of your waist.  Place your opposite hand on the underside   of your forearm. Gently push up as your right / left arm resists. Push as hard as you can with both arms, without causing any pain or movement at your right / left elbow. Hold this stationary position for __________ seconds. Gradually release the tension in both arms. Allow your muscles to relax completely before repeating.   This information is not intended to replace advice given to you by your health care provider. Make sure you discuss any questions you have with your health care provider.   Document Released:  03/18/2005 Document Revised: 04/08/2014 Document Reviewed: 06/30/2008 Elsevier Interactive Patient Education 2016 [employer]   Carpal Tunnel Syndrome Carpal tunnel syndrome is a condition that causes pain in your hand and arm. The carpal tunnel is a narrow area located on the palm side of your wrist. Repeated wrist motion or certain diseases may cause swelling within the tunnel. This swelling pinches the main nerve in the wrist (median nerve). CAUSES  This condition may be caused by:   Repeated wrist motions.  Wrist injuries.  Arthritis.  A cyst or tumor in the carpal tunnel.  Fluid buildup during pregnancy. Sometimes the cause of this condition is not known.  RISK FACTORS This condition is more likely to develop in:   People who have jobs that cause them to repeatedly move their wrists in the same motion, such as butchers and cashiers.  Women.  People with certain conditions, such as:  Diabetes.  Obesity.  An underactive thyroid (hypothyroidism).  Kidney failure. SYMPTOMS  Symptoms of this condition include:   A tingling feeling in your fingers, especially in your thumb, index, and middle fingers.  Tingling or numbness in your hand.  An aching feeling in your entire arm, especially when your wrist and elbow are bent for long periods of time.  Wrist pain that goes up your arm to your shoulder.  Pain that goes down into your palm or fingers.  A weak feeling in your hands. You may have trouble grabbing and holding items. Your symptoms may feel worse during the night.  DIAGNOSIS  This condition is diagnosed with a medical history and physical exam. You may also have tests, including:   An electromyogram (EMG). This test measures electrical signals sent by your nerves into the muscles.  X-rays. TREATMENT  Treatment for this condition includes:  Lifestyle changes. It is important to stop doing or modify the activity that caused your condition.  Physical or  occupational therapy.  Medicines for pain and inflammation. This may include medicine that is injected into your wrist.  A wrist splint.  Surgery. HOME CARE INSTRUCTIONS  If You Have a Splint:  Wear it as told by your health care provider. Remove it only as told by your health care provider.  Loosen the splint if your fingers become numb and tingle, or if they turn cold and blue.  Keep the splint clean and dry. General Instructions  Take over-the-counter and prescription medicines only as told by your health care provider.  Rest your wrist from any activity that may be causing your pain. If your condition is work related, talk to your employer about changes that can be made, such as getting a wrist pad to use while typing.  If directed, apply ice to the painful area:  Put ice in a plastic bag.  Place a towel between your skin and the bag.  Leave the ice on for 20 minutes, 2-3 times per day.  Keep all follow-up visits as told by  your health care provider. This is important.  Do any exercises as told by your health care provider, physical therapist, or occupational therapist. SEEK MEDICAL CARE IF:   You have new symptoms.  Your pain is not controlled with medicines.  Your symptoms get worse.   This information is not intended to replace advice given to you by your health care provider. Make sure you discuss any questions you have with your health care provider.   Document Released: 03/15/2000 Document Revised: 12/07/2014 Document Reviewed: 08/03/2014 Elsevier Interactive Patient Education [employer]

## 2015-06-11 ENCOUNTER — Emergency Department (HOSPITAL_COMMUNITY)
Admission: EM | Admit: 2015-06-11 | Discharge: 2015-06-11 | Disposition: A | Discharge status: Home / Self Care | Payer: BC Managed Care – PPO | Attending: Emergency Medicine | Admitting: Emergency Medicine

## 2015-06-11 ENCOUNTER — Encounter (HOSPITAL_COMMUNITY): Payer: Self-pay

## 2015-06-11 ENCOUNTER — Emergency Department (HOSPITAL_COMMUNITY): Payer: BC Managed Care – PPO

## 2015-06-11 DIAGNOSIS — Z880 Allergy status to penicillin: Secondary | ICD-10-CM | POA: Diagnosis not present

## 2015-06-11 DIAGNOSIS — M1990 Unspecified osteoarthritis, unspecified site: Secondary | ICD-10-CM | POA: Insufficient documentation

## 2015-06-11 DIAGNOSIS — I10 Essential (primary) hypertension: Secondary | ICD-10-CM | POA: Insufficient documentation

## 2015-06-11 DIAGNOSIS — Z8639 Personal history of other endocrine, nutritional and metabolic disease: Secondary | ICD-10-CM | POA: Insufficient documentation

## 2015-06-11 DIAGNOSIS — Z7951 Long term (current) use of inhaled steroids: Secondary | ICD-10-CM | POA: Insufficient documentation

## 2015-06-11 DIAGNOSIS — Z79899 Other long term (current) drug therapy: Secondary | ICD-10-CM | POA: Insufficient documentation

## 2015-06-11 DIAGNOSIS — J45901 Unspecified asthma with (acute) exacerbation: Secondary | ICD-10-CM | POA: Diagnosis not present

## 2015-06-11 DIAGNOSIS — R079 Chest pain, unspecified: Secondary | ICD-10-CM | POA: Diagnosis not present

## 2015-06-11 DIAGNOSIS — Z791 Long term (current) use of non-steroidal anti-inflammatories (NSAID): Secondary | ICD-10-CM | POA: Diagnosis not present

## 2015-06-11 LAB — CBC WITH DIFFERENTIAL/PLATELET
BASOS ABS: 0 10*3/uL (ref 0.0–0.1)
BASOS PCT: 0 %
Eosinophils Absolute: 0.3 10*3/uL (ref 0.0–0.7)
Eosinophils Relative: 3 %
HEMATOCRIT: 38.4 % (ref 36.0–46.0)
HEMOGLOBIN: 12.2 g/dL (ref 12.0–15.0)
Lymphocytes Relative: 36 %
Lymphs Abs: 3.3 10*3/uL (ref 0.7–4.0)
MCH: 25.5 pg — ABNORMAL LOW (ref 26.0–34.0)
MCHC: 31.8 g/dL (ref 30.0–36.0)
MCV: 80.2 fL (ref 78.0–100.0)
MONOS PCT: 9 %
Monocytes Absolute: 0.8 10*3/uL (ref 0.1–1.0)
NEUTROS ABS: 4.7 10*3/uL (ref 1.7–7.7)
NEUTROS PCT: 52 %
Platelets: 304 10*3/uL (ref 150–400)
RBC: 4.79 MIL/uL (ref 3.87–5.11)
RDW: 14.1 % (ref 11.5–15.5)
WBC: 9.1 10*3/uL (ref 4.0–10.5)

## 2015-06-11 LAB — TROPONIN I: Troponin I: <0.03 ng/mL (ref <0.031)

## 2015-06-11 LAB — BASIC METABOLIC PANEL
ANION GAP: 14 (ref 5–15)
BUN: 13 mg/dL (ref 6–20)
CALCIUM: 9.4 mg/dL (ref 8.9–10.3)
CO2: 22 mmol/L (ref 22–32)
CREATININE: 0.86 mg/dL (ref 0.44–1.00)
Chloride: 103 mmol/L (ref 101–111)
Glucose, Bld: 102 mg/dL — ABNORMAL HIGH (ref 65–99)
Potassium: 3.8 mmol/L (ref 3.5–5.1)
Sodium: 139 mmol/L (ref 135–145)

## 2015-06-11 LAB — D-DIMER, QUANTITATIVE (NOT AT ARMC): D DIMER QUANT: 0.46 ug{FEU}/mL (ref 0.00–0.50)

## 2015-06-11 MED ORDER — NITROGLYCERIN 0.4 MG SL SUBL
0.4000 mg | SUBLINGUAL_TABLET | SUBLINGUAL | Status: DC | PRN
  Administered 2015-06-11 (×2): 0.4 mg via SUBLINGUAL
  Filled 2015-06-11: qty 1

## 2015-06-11 MED ORDER — SODIUM CHLORIDE 0.9 % IV SOLN
INTRAVENOUS | Status: DC
  Administered 2015-06-11: 10 mL/h via INTRAVENOUS

## 2015-06-11 MED ORDER — LORAZEPAM 2 MG/ML IJ SOLN
1.0000 mg | Freq: Once | INTRAMUSCULAR | Status: AC
Start: 2015-06-11 — End: 2015-06-11
  Administered 2015-06-11: 1 mg via INTRAVENOUS
  Filled 2015-06-11: qty 1

## 2015-06-11 MED ORDER — NITROGLYCERIN 0.4 MG SL SUBL
SUBLINGUAL_TABLET | SUBLINGUAL | Status: AC
  Administered 2015-06-11: 0.4 mg
  Filled 2015-06-11: qty 1

## 2015-06-11 MED ORDER — NAPROXEN 500 MG PO TABS: 500.0000 mg | ORAL_TABLET | Freq: Two times a day (BID) | ORAL | Status: DC

## 2015-06-11 MED ORDER — KETOROLAC TROMETHAMINE 30 MG/ML IJ SOLN
30.0000 mg | Freq: Once | INTRAMUSCULAR | Status: AC
  Administered 2015-06-11: 30 mg via INTRAVENOUS
  Filled 2015-06-11: qty 1

## 2015-06-11 MED ORDER — METHOCARBAMOL 500 MG PO TABS: 500.0000 mg | ORAL_TABLET | Freq: Two times a day (BID) | ORAL | Status: DC

## 2015-06-11 NOTE — Discharge Instructions (Signed)

## 2015-06-11 NOTE — ED Notes (Signed)
MD at the bedside. Pt returned from Xray

## 2015-06-11 NOTE — ED Notes (Signed)
Pt ambulated to bathroom without difficulty.

## 2015-06-11 NOTE — ED Notes (Signed)
Per EMS, Pt was sitting in church this morning when she started to have sharp left-sided chest pain that radiated down the left arm. Denied any other symptoms. Pt reports getting worse over the day. Pt called EMS when the pain became 9/10. Pt was given 324 mg of Aspirin and 1 Nitro SL with decrease in pain. VS per EMS: 149/75, 95 HR, 97 % on 2L. 95 CBG.

## 2015-06-11 NOTE — ED Provider Notes (Signed)
CSN: [id number]     Arrival date & time 06/11/15  1352 History   First MD Initiated Contact with Patient 06/11/15 1357     Chief Complaint  Patient presents with  . Chest Pain     (Consider location/radiation/quality/duration/timing/severity/associated sxs/prior Treatment) HPI Comments: Patient here complaining of sudden onset of left-sided chest pain characterized as sharp and worse with taking deep breath or moving around. The pain radiated down to her left arm. Symptoms occurred at rest and denies any trauma. She had some associated dyspnea but denies any recent leg pain or swelling. No cough or congestion. No syncope or near-syncope.  Denies any palpitations. Called EMS and was given aspirin and nitroglycerin and her symptoms improved.  Patient is a 55 y.o. female presenting with chest pain. The history is provided by the patient.  Chest Pain   Past Medical History  Diagnosis Date  . Allergy   . Arthritis     hands and knees  . Asthma   . Hyperlipidemia 09/04/2011  . Hypertension    Past Surgical History  Procedure Laterality Date  . Appendectomy    . Breast surgery    . Tubal ligation    . Knee surgery      1994   Family History  Problem Relation Age of Onset  . Arthritis Mother   . Diabetes Mother   . Hyperlipidemia Mother   . Hypertension Mother   . Hyperlipidemia Sister   . Hypertension Sister   . Lupus Sister   . Cancer Maternal Aunt   . Diabetes Maternal Grandmother   . Heart disease Maternal Grandfather   . Heart disease Paternal Grandfather    Social History  Substance Use Topics  . Smoking status: Never Smoker   . Smokeless tobacco: Never Used  . Alcohol Use: Yes     Comment: social   OB History    No data available     Review of Systems  Cardiovascular: Positive for chest pain.  All other systems reviewed and are negative.     Allergies  Caffeine and Penicillins  Home Medications   Prior to Admission medications   Medication Sig  Start Date End Date Taking? Authorizing Provider  amLODipine (NORVASC) 5 MG tablet Take 1 tablet (5 mg total) by mouth daily. 03/29/15   Garnetta BuddyStephanie D English, PA  cetirizine (ZYRTEC) 10 MG tablet Take 1 tablet (10 mg total) by mouth daily. 09/01/14   Tishira R Brewington, PA-C  desloratadine (CLARINEX) 5 MG tablet Take 1 tablet (5 mg total) by mouth daily. 02/01/15   [name], PA  diclofenac sodium (VOLTAREN) 1 % GEL Apply 2 g topically 4 (four) times daily. 06/01/15   Ethelda ChickKristi M Smith, MD  fluticasone (FLONASE) 50 MCG/ACT nasal spray Place 2 sprays into both nostrils daily. 04/14/14   Andrena MewsMichael D Rigby, DO  hydrochlorothiazide (HYDRODIURIL) 12.5 MG tablet Take 1 tablet (12.5 mg total) by mouth daily. 07/29/14   Maurice MarchBarbara B McPherson, MD  HYDROcodone-homatropine [facility](HYCODAN) 5-1.5 MG/5ML syrup Take 5 mLs by mouth every 8 (eight) hours as needed for cough. 05/12/15   Elvina SidleKurt Lauenstein, MD   There were no vitals taken for this visit. Physical Exam  Constitutional: She is oriented to person, place, and time. She appears well-developed and well-nourished.  Non-toxic appearance. No distress.  HENT:  Head: Normocephalic and atraumatic.  Eyes: Conjunctivae, EOM and lids are normal. Pupils are equal, round, and reactive to light.  Neck: Normal range of motion. Neck supple. No tracheal deviation  present. No thyroid mass present.  Cardiovascular: Normal rate, regular rhythm and normal heart sounds.  Exam reveals no gallop.   No murmur heard. Pulmonary/Chest: Effort normal and breath sounds normal. No stridor. No respiratory distress. She has no decreased breath sounds. She has no wheezes. She has no rhonchi. She has no rales. She exhibits bony tenderness. She exhibits no crepitus.    Abdominal: Soft. Normal appearance and bowel sounds are normal. She exhibits no distension. There is no tenderness. There is no rebound and no CVA tenderness.  Musculoskeletal: Normal range of motion. She exhibits no edema or  tenderness.  Neurological: She is alert and oriented to person, place, and time. She has normal strength. No cranial nerve deficit or sensory deficit. GCS eye subscore is 4. GCS verbal subscore is 5. GCS motor subscore is 6.  Skin: Skin is warm and dry. No abrasion and no rash noted.  Psychiatric: She has a normal mood and affect. Her speech is normal and behavior is normal.  Nursing note and vitals reviewed.   ED Course  Procedures (including critical care time) Labs Review Labs Reviewed  TROPONIN I  CBC WITH DIFFERENTIAL/PLATELET  BASIC METABOLIC PANEL  D-DIMER, QUANTITATIVE (NOT AT [facility])    Imaging Review No results found. I have personally reviewed and evaluated these images and lab results as part of my medical decision-making.   EKG Interpretation   Date/Time:  Sunday June 11 2015 14:04:07 EDT Ventricular Rate:  86 PR Interval:  140 QRS Duration: 86 QT Interval:  329 QTC Calculation: 393 R Axis:   43 Text Interpretation:  Sinus rhythm Borderline repolarization abnormality  No significant change since last tracing Confirmed by Ysidro Ramsay  MD, Melonie Germani  ([id number]) on 06/11/2015 2:21:03 PM      MDM   Final diagnoses:  Chest pain    Pt with likely chest wall pain, given ativan feels better, will do delta troponin and d/c w/ cards f/u and return precautions  HEART score of 3. Dr Anitra Lauth to f/u    Lorre Nick, MD 06/11/15 [phone]

## 2015-07-19 ENCOUNTER — Ambulatory Visit (INDEPENDENT_AMBULATORY_CARE_PROVIDER_SITE_OTHER): Payer: BC Managed Care – PPO | Admitting: Family Medicine

## 2015-07-19 VITALS — BP 148/90 | HR 90 | Temp 98.0°F | Resp 18 | Ht 70.0 in | Wt 318.6 lb

## 2015-07-19 DIAGNOSIS — R0789 Other chest pain: Secondary | ICD-10-CM

## 2015-07-19 MED ORDER — PREDNISONE 20 MG PO TABS: ORAL_TABLET | ORAL | Status: DC

## 2015-07-19 NOTE — Patient Instructions (Addendum)
  Let me know if chest pain continues.  If it worsens, call or go to ED

## 2015-07-19 NOTE — Progress Notes (Signed)
55 [facility] with recurrence of left upper chest pain similar to that she experienced in March.  Her enzymes were negative.  D-dimer was negative.  Pain disappeared in a week or so  She started having a similar pain last night.  Left upper chest is tender.  She has not been lifting anything unusual or coughing.  Twisting to the right is painful as is raising her left arm.  No dyspnea, nausea, diaphoresis.  Pain is heavy with occasioinal sharp twinges.  She had pizza last night.  She has tried zantac with no benefit.   Objective:  BP 148/90 mmHg  Pulse 90  Temp(Src) 98 F (36.7 C) (Oral)  Resp 18  Ht 5\' 10"  (1.778 m)  Wt 318 lb 9.6 oz (144.516 kg)  BMI 45.71 kg/m2  SpO2 98%  Chest:  Clear Heart:  Reg, no murmur or gallop Ext:  No leg tenderness or edema  EKG:  No significant change  Assessment:  Atypical chest wall pain  Other chest pain - Plan: EKG 12-Lead, predniSONE (DELTASONE) 20 MG tablet  Elvina SidleKurt Deray Dawes, MD

## 2015-08-03 ENCOUNTER — Other Ambulatory Visit: Payer: Self-pay

## 2015-08-03 DIAGNOSIS — I10 Essential (primary) hypertension: Secondary | ICD-10-CM

## 2015-08-03 MED ORDER — HYDROCHLOROTHIAZIDE 12.5 MG PO TABS: 12.5000 mg | ORAL_TABLET | Freq: Every day | ORAL | Status: DC

## 2015-09-28 ENCOUNTER — Other Ambulatory Visit: Payer: Self-pay | Admitting: Physician Assistant

## 2015-10-06 ENCOUNTER — Ambulatory Visit (INDEPENDENT_AMBULATORY_CARE_PROVIDER_SITE_OTHER): Payer: BC Managed Care – PPO | Admitting: Emergency Medicine

## 2015-10-06 ENCOUNTER — Other Ambulatory Visit: Payer: Self-pay | Admitting: Emergency Medicine

## 2015-10-06 VITALS — BP 134/88 | HR 64 | Temp 98.1°F | Resp 18 | Ht 70.0 in | Wt 317.0 lb

## 2015-10-06 DIAGNOSIS — Z1211 Encounter for screening for malignant neoplasm of colon: Secondary | ICD-10-CM | POA: Diagnosis not present

## 2015-10-06 DIAGNOSIS — E669 Obesity, unspecified: Secondary | ICD-10-CM

## 2015-10-06 DIAGNOSIS — Z1159 Encounter for screening for other viral diseases: Secondary | ICD-10-CM | POA: Diagnosis not present

## 2015-10-06 DIAGNOSIS — I10 Essential (primary) hypertension: Secondary | ICD-10-CM

## 2015-10-06 LAB — POCT CBC
Granulocyte percent: 52.8 %G (ref 37–80)
HCT, POC: 37.6 % — AB (ref 37.7–47.9)
HEMOGLOBIN: 13 g/dL (ref 12.2–16.2)
LYMPH, POC: 2.7 (ref 0.6–3.4)
MCH: 26.2 pg — AB (ref 27–31.2)
MCHC: 34.7 g/dL (ref 31.8–35.4)
MCV: 75.4 fL — AB (ref 80–97)
MID (cbc): 0.6 (ref 0–0.9)
MPV: 6.7 fL (ref 0–99.8)
PLATELET COUNT, POC: 259 10*3/uL (ref 142–424)
POC GRANULOCYTE: 3.7 (ref 2–6.9)
POC LYMPH PERCENT: 38.6 %L (ref 10–50)
POC MID %: 8.6 % (ref 0–12)
RBC: 4.98 M/uL (ref 4.04–5.48)
RDW, POC: 14.3 %
WBC: 7.1 10*3/uL (ref 4.6–10.2)

## 2015-10-06 LAB — BASIC METABOLIC PANEL WITH GFR
BUN: 10 mg/dL (ref 7–25)
CHLORIDE: 104 mmol/L (ref 98–110)
CO2: 22 mmol/L (ref 20–31)
CREATININE: 0.79 mg/dL (ref 0.50–1.05)
Calcium: 9.4 mg/dL (ref 8.6–10.4)
GFR, Est African American: >89 mL/min (ref >=60)
GFR, Est Non African American: 83 mL/min (ref >=60)
GLUCOSE: 98 mg/dL (ref 65–99)
Potassium: 4.5 mmol/L (ref 3.5–5.3)
Sodium: 139 mmol/L (ref 135–146)

## 2015-10-06 LAB — HEPATITIS C ANTIBODY: HCV AB: REACTIVE — AB

## 2015-10-06 MED ORDER — HYDROCHLOROTHIAZIDE 12.5 MG PO TABS: 12.5000 mg | ORAL_TABLET | Freq: Every day | ORAL | Status: DC

## 2015-10-06 MED ORDER — AMLODIPINE BESYLATE 5 MG PO TABS: 5.0000 mg | ORAL_TABLET | Freq: Every day | ORAL | Status: DC

## 2015-10-06 NOTE — Patient Instructions (Addendum)
   IF you received an x-ray today, you will receive an invoice from [facility]. Please contact [facility] at [phone] with questions or concerns regarding your invoice.   IF you received labwork today, you will receive an invoice from [facility]. Please contact [facility] at [phone] with questions or concerns regarding your invoice.   Our billing staff will not be able to assist you with questions regarding bills from these companies.  You will be contacted with the lab results as soon as they are available. The fastest way to get your results is to activate your My Chart account. Instructions are located on the last page of this paperwork. If you have not heard from us regarding the results in 2 weeks, please contact this office.     DASH Eating Plan DASH stands for "Dietary Approaches to Stop Hypertension." The DASH eating plan is a healthy eating plan that has been shown to reduce high blood pressure (hypertension). Additional health benefits may include reducing the risk of type 2 diabetes mellitus, heart disease, and stroke. The DASH eating plan may also help with weight loss. WHAT DO I NEED TO KNOW ABOUT THE DASH EATING PLAN? For the DASH eating plan, you will follow these general guidelines:  Choose foods with a percent daily value for sodium of less than 5% (as listed on the food label).  Use salt-free seasonings or herbs instead of table salt or sea salt.  Check with your health care provider or pharmacist before using salt substitutes.  Eat lower-sodium products, often labeled as "lower sodium" or "no salt added."  Eat fresh foods.  Eat more vegetables, fruits, and low-fat dairy products.  Choose whole grains. Look for the word "whole" as the first word in the ingredient list.  Choose fish and skinless chicken or turkey more often than red meat. Limit fish, poultry, and meat to 6 oz (170 g) each day.  Limit sweets,  desserts, sugars, and sugary drinks.  Choose heart-healthy fats.  Limit cheese to 1 oz (28 g) per day.  Eat more home-cooked food and less restaurant, buffet, and fast food.  Limit fried foods.  Cook foods using methods other than frying.  Limit canned vegetables. If you do use them, rinse them well to decrease the sodium.  When eating at a restaurant, ask that your food be prepared with less salt, or no salt if possible. WHAT FOODS CAN I EAT? Seek help from a dietitian for individual calorie needs. Grains Whole grain or whole wheat bread. Brown rice. Whole grain or whole wheat pasta. Quinoa, bulgur, and whole grain cereals. Low-sodium cereals. Corn or whole wheat flour tortillas. Whole grain cornbread. Whole grain crackers. Low-sodium crackers. Vegetables Fresh or frozen vegetables (raw, steamed, roasted, or grilled). Low-sodium or reduced-sodium tomato and vegetable juices. Low-sodium or reduced-sodium tomato sauce and paste. Low-sodium or reduced-sodium canned vegetables.  Fruits All fresh, canned (in natural juice), or frozen fruits. Meat and Other Protein Products Ground beef (85% or leaner), grass-fed beef, or beef trimmed of fat. Skinless chicken or turkey. Ground chicken or turkey. Pork trimmed of fat. All fish and seafood. Eggs. Dried beans, peas, or lentils. Unsalted nuts and seeds. Unsalted canned beans. Dairy Low-fat dairy products, such as skim or 1% milk, 2% or reduced-fat cheeses, low-fat ricotta or cottage cheese, or plain low-fat yogurt. Low-sodium or reduced-sodium cheeses. Fats and Oils Tub margarines without trans fats. Light or reduced-fat mayonnaise and salad dressings (reduced sodium). Avocado. Safflower, olive, or canola   oils. Natural peanut or almond butter. Other Unsalted popcorn and pretzels. The items listed above may not be a complete list of recommended foods or beverages. Contact your dietitian for more options. WHAT FOODS ARE NOT  RECOMMENDED? Grains White bread. White pasta. White rice. Refined cornbread. Bagels and croissants. Crackers that contain trans fat. Vegetables Creamed or fried vegetables. Vegetables in a cheese sauce. Regular canned vegetables. Regular canned tomato sauce and paste. Regular tomato and vegetable juices. Fruits Dried fruits. Canned fruit in light or heavy syrup. Fruit juice. Meat and Other Protein Products Fatty cuts of meat. Ribs, chicken wings, bacon, sausage, bologna, salami, chitterlings, fatback, hot dogs, bratwurst, and packaged luncheon meats. Salted nuts and seeds. Canned beans with salt. Dairy Whole or 2% milk, cream, half-and-half, and cream cheese. Whole-fat or sweetened yogurt. Full-fat cheeses or blue cheese. Nondairy creamers and whipped toppings. Processed cheese, cheese spreads, or cheese curds. Condiments Onion and garlic salt, seasoned salt, table salt, and sea salt. Canned and packaged gravies. Worcestershire sauce. Tartar sauce. Barbecue sauce. Teriyaki sauce. Soy sauce, including reduced sodium. Steak sauce. Fish sauce. Oyster sauce. Cocktail sauce. Horseradish. Ketchup and mustard. Meat flavorings and tenderizers. Bouillon cubes. Hot sauce. Tabasco sauce. Marinades. Taco seasonings. Relishes. Fats and Oils Butter, stick margarine, lard, shortening, ghee, and bacon fat. Coconut, palm kernel, or palm oils. Regular salad dressings. Other Pickles and olives. Salted popcorn and pretzels. The items listed above may not be a complete list of foods and beverages to avoid. Contact your dietitian for more information. WHERE CAN I FIND MORE INFORMATION? National Heart, Lung, and Blood Institute: www.nhlbi.nih.gov/health/health-topics/topics/dash/   This information is not intended to replace advice given to you by your health care provider. Make sure you discuss any questions you have with your health care provider.   Document Released: 03/07/2011 Document Revised: 04/08/2014  Document Reviewed: 01/20/2013 Elsevier Interactive Patient Education 2016 Elsevier Inc.  

## 2015-10-06 NOTE — Progress Notes (Signed)
Subjective:  This chart was scribed for Mackenzie ChrisSteven Daub MD, by Mackenzie Everett,scribe, at [facility].  This patient was seen in room  11 and the patient's care was started at 9:42 AM.   Chief Complaint  Patient presents with  . Medication Refill    amlodipine/hctz     Patient ID: Mackenzie Everett, female    DOB: [birth year], 55 y.o.   MRN: [id number]  HPI HPI Comments: Mackenzie Everett is a 55 y.o. female who presents to the [facility] for a follow up regarding her blood pressure.  She has not had her medication for the past 3 days. She has been on blood pressure medication for a little over three years. Patient has decreased the amount of salt she has been eating and goes to the gym three times a week (bootcamp).  On days she is not able to go to the gym, she walks. 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  She is up to date with her mammograms.  She will be going to a family reunion in [place] this summer.     Patient Active Problem List   Diagnosis Date Noted  . Obesity, Class III, BMI 40-49.9 (morbid obesity) (HCC) 09/04/2011  . Hyperlipidemia 09/04/2011  . Migraine variant 09/04/2011  . Asthma with allergic rhinitis 09/04/2011  . Degenerative disc disease, lumbar 09/04/2011   Past Medical History  Diagnosis Date  . Allergy   . Arthritis     hands and knees  . Asthma   . Hyperlipidemia 09/04/2011  . Hypertension    Past Surgical History  Procedure Laterality Date  . Appendectomy    . Breast surgery    . Tubal ligation    . Knee surgery      1994   Allergies  Allergen Reactions  . Caffeine Other (See Comments)    migraines  . Penicillins Hives   Prior to Admission medications   Medication Sig Start Date End Date Taking? Authorizing Provider  amLODipine (NORVASC) 5 MG tablet Take 1 tablet (5 mg total) by mouth daily. 03/29/15  Yes [name], PA  desloratadine (CLARINEX) 5 MG tablet Take 1 tablet (5 mg total) by mouth daily. 02/01/15   Yes Stephanie D English, PA  hydrochlorothiazide (HYDRODIURIL) 12.5 MG tablet Take 1 tablet (12.5 mg total) by mouth daily. 08/03/15  Yes [name], PA-C   Social History   Social History  . Marital Status: Single    Spouse Name: N/A  . Number of Children: N/A  . Years of Education: N/A   Occupational History  . Not on file.   Social History Main Topics  . Smoking status: Never Smoker   . Smokeless tobacco: Never Used  . Alcohol Use: Yes     Comment: social  . Drug Use: No  . Sexual Activity: Yes    Birth Control/ Protection: None   Other Topics Concern  . Not on file   Social History Narrative   Lives at home alone       Review of Systems  Constitutional: Negative for fever and chills.  Eyes: Negative for pain, redness and itching.  Respiratory: Negative for cough, choking and shortness of breath.   Gastrointestinal: Negative for nausea and vomiting.  Musculoskeletal: Negative for neck pain and neck stiffness.  Skin: Negative for color change.  Neurological: Negative for syncope and speech difficulty.       Objective:   Physical Exam  Filed Vitals:  10/06/15 0903  BP: 134/88  Pulse: 64  Temp: 98.1 F (36.7 C)  TempSrc: Oral  Resp: 18  Height: 5\' 10"  (1.778 m)  Weight: 317 lb (143.79 kg)  SpO2: 99%    CONSTITUTIONAL: Well developed/well nourished HEAD: Normocephalic/atraumatic EYES: EOMI/PERRL SPINE/BACK:entire spine nontender CV: S1/S2 noted, no murmurs/rubs/gallops noted LUNGS: Lungs are clear to auscultation bilaterally, no apparent distress NEURO: Pt is awake/alert/appropriate, moves all extremitiesx4.  No facial droop.   EXTREMITIES: pulses normal/equal, full ROM SKIN: warm, color normal PSYCH: no abnormalities of mood noted, alert and oriented to situation   Blood pressure left arm: 132/82  Results for orders placed or performed in visit on 10/06/15  POCT CBC  Result Value Ref Range   WBC 7.1 4.6 - 10.2 K/uL   Lymph, poc 2.7 0.6  - 3.4   POC LYMPH PERCENT 38.6 10 - 50 %L   MID (cbc) 0.6 0 - 0.9   POC MID % 8.6 0 - 12 %M   POC Granulocyte 3.7 2 - 6.9   Granulocyte percent 52.8 37 - 80 %G   RBC 4.98 4.04 - 5.48 M/uL   Hemoglobin 13.0 12.2 - 16.2 g/dL   HCT, POC 69.637.6 (A) 29.537.7 - 47.9 %   MCV 75.4 (A) 80 - 97 fL   MCH, POC 26.2 (A) 27 - 31.2 pg   MCHC 34.7 31.8 - 35.4 g/dL   RDW, POC 28.414.3 %   Platelet Count, POC 259 142 - 424 K/uL   MPV 6.7 0 - 99.8 fL        Assessment & Plan:  Blood pressure medications were refilled. She is not anemic but she does have microcytic indices. She is agreeable to have a colonoscopy scheduled. She states she is up-to-date on mammograms. Blood pressure meds refill one year.I personally performed the services described in this documentation, which was scribed in my presence. The recorded information has been reviewed and is accurate.  Collene GobbleSteven A Daub, MD

## 2015-10-07 LAB — HEPATIC FUNCTION PANEL
ALT: 19 U/L (ref 6–29)
AST: 20 U/L (ref 10–35)
Albumin: 4.3 g/dL (ref 3.6–5.1)
Alkaline Phosphatase: 81 U/L (ref 33–130)
BILIRUBIN DIRECT: 0.1 mg/dL (ref <=0.2)
Indirect Bilirubin: 0.4 mg/dL (ref 0.2–1.2)
Total Bilirubin: 0.5 mg/dL (ref 0.2–1.2)
Total Protein: 7.1 g/dL (ref 6.1–8.1)

## 2015-10-10 ENCOUNTER — Other Ambulatory Visit: Payer: Self-pay | Admitting: Emergency Medicine

## 2015-10-10 DIAGNOSIS — B182 Chronic viral hepatitis C: Secondary | ICD-10-CM

## 2015-10-10 LAB — HEPATITIS C RNA QUANTITATIVE
HCV Quantitative Log: 5.5 {Log} — ABNORMAL HIGH (ref <1.18)
HCV Quantitative: 317176 IU/mL — ABNORMAL HIGH (ref <15)

## 2015-10-11 ENCOUNTER — Other Ambulatory Visit: Payer: Self-pay | Admitting: Emergency Medicine

## 2015-10-11 DIAGNOSIS — B182 Chronic viral hepatitis C: Secondary | ICD-10-CM

## 2015-10-31 ENCOUNTER — Telehealth: Payer: Self-pay

## 2015-10-31 NOTE — Telephone Encounter (Signed)
Spoke to the patient to discuss the referral.  I am sending her referral to [facility].  The scheduler said they are booking about 2-3 weeks out.  The patient was agreeable to changing the location to [facility].   [facility] [address] [facility] [address] [place], [place] [address] Phone: [phone] Fax: [phone]

## 2015-10-31 NOTE — Telephone Encounter (Signed)
Patient stated she had a referral placed for the infectious disease and they stated it is a two month wait. [phone]. Patient stated is it neccessary to wait two months? Can she been seen at another facility.

## 2015-11-03 ENCOUNTER — Ambulatory Visit (INDEPENDENT_AMBULATORY_CARE_PROVIDER_SITE_OTHER): Payer: BC Managed Care – PPO | Admitting: Family Medicine

## 2015-11-03 ENCOUNTER — Ambulatory Visit (INDEPENDENT_AMBULATORY_CARE_PROVIDER_SITE_OTHER): Payer: BC Managed Care – PPO

## 2015-11-03 VITALS — BP 126/84 | HR 93 | Temp 98.2°F | Resp 18 | Ht 70.0 in | Wt 319.0 lb

## 2015-11-03 DIAGNOSIS — S93601A Unspecified sprain of right foot, initial encounter: Secondary | ICD-10-CM

## 2015-11-03 DIAGNOSIS — M79671 Pain in right foot: Secondary | ICD-10-CM

## 2015-11-03 DIAGNOSIS — M19071 Primary osteoarthritis, right ankle and foot: Secondary | ICD-10-CM | POA: Diagnosis not present

## 2015-11-03 MED ORDER — HYDROCODONE-ACETAMINOPHEN 5-325 MG PO TABS: 1.0000 | ORAL_TABLET | Freq: Four times a day (QID) | ORAL | 0 refills | Status: DC | PRN

## 2015-11-03 NOTE — Patient Instructions (Addendum)
There was no sign of fracture on x-ray, but there were some signs of wear and tear/arthritis. Based on the location of your pain though, I would like you to wear the walking boot until recheck in the next 1 week to 10 days. If any worsening of pain in that time period, can recheck sooner. Can try Tylenol over-the-counter, or for more severe pain, hydrocodone up to every 6 hours. Elevate foot when seated, and if you are able to come out of the brace once or twice a day for range of motion at the ankle, that is okay.  Return to the clinic or go to the nearest emergency room if any of your symptoms worsen or new symptoms occur.  Foot Sprain A foot sprain is an injury to one of the strong bands of tissue (ligaments) that connect and support the many bones in your feet. The ligament can be stretched too much or it can tear. A tear can be either partial or complete. The severity of the sprain depends on how much of the ligament was damaged or torn. CAUSES A foot sprain is usually caused by suddenly twisting or pivoting your foot. RISK FACTORS This injury is more likely to occur in people who:  Play a sport, such as basketball or football.  Exercise or play a sport without warming up.  Start a new workout or sport.  Suddenly increase how long or hard they exercise or play a sport. SYMPTOMS Symptoms of this condition start soon after an injury and include:  Pain, especially in the arch of the foot.  Bruising.  Swelling.  Inability to walk or use the foot to support body weight. DIAGNOSIS This condition is diagnosed with a medical history and physical exam. You may also have imaging tests, such as:  X-rays to make sure there are no broken bones (fractures).  MRI to see if the ligament has torn. TREATMENT Treatment varies depending on the severity of your sprain. Mild sprains can be treated with rest, ice, compression, and elevation (RICE). If your ligament is overstretched or partially  torn, treatment usually involves keeping your foot in a fixed position (immobilization) for a period of time. To help you do this, your health care provider will apply a bandage, splint, or walking boot to keep your foot from moving until it heals. You may also be advised to use crutches or a scooter for a few weeks to avoid bearing weight on your foot while it is healing. If your ligament is fully torn, you may need surgery to reconnect the ligament to the bone. After surgery, a cast or splint will be applied and will need to stay on your foot while it heals. Your health care provider may also suggest exercises or physical therapy to strengthen your foot. HOME CARE INSTRUCTIONS If You Have a Bandage, Splint, or Walking Boot:  Wear it as directed by your health care provider. Remove it only as directed by your health care provider.  Loosen the bandage, splint, or walking boot if your toes become numb and tingle, or if they turn cold and blue. Bathing  If your health care provider approves bathing and showering, cover the bandage or splint with a watertight plastic bag to protect it from water. Do not let the bandage or splint get wet. Managing Pain, Stiffness, and Swelling   If directed, apply ice to the injured area:  Put ice in a plastic bag.  Place a towel between your skin and the bag.  Leave the ice on for 20 minutes, 2-3 times per day.  Move your toes often to avoid stiffness and to lessen swelling.  Raise (elevate) the injured area above the level of your heart while you are sitting or lying down. Driving  Do not drive or operate heavy machinery while taking pain medicine.  Do not drive while wearing a bandage, splint, or walking boot on a foot that you use for driving. Activity  Rest as directed by your health care provider.  Do not use the injured foot to support your body weight until your health care provider says that you can. Use crutches or other supportive devices as  directed by your health care provider.  Ask your health care provider what activities are safe for you. Gradually increase how much and how far you walk until your health care provider says it is safe to return to full activity.  Do any exercise or physical therapy as directed by your health care provider. General Instructions  If a splint was applied, do not put pressure on any part of it until it is fully hardened. This may take several hours.  Take medicines only as directed by your health care provider. These include over-the-counter medicines and prescription medicines.  Keep all follow-up visits as directed by your health care provider. This is important.  When you can walk without pain, wear supportive shoes that have stiff soles. Do not wear flip-flops, and do not walk barefoot. SEEK MEDICAL CARE IF:  Your pain is not controlled with medicine.  Your bruising or swelling gets worse or does not get better with treatment.  Your splint or walking boot is damaged. SEEK IMMEDIATE MEDICAL CARE IF:  Your foot is numb or blue.  Your foot feels colder than normal.   This information is not intended to replace advice given to you by your health care provider. Make sure you discuss any questions you have with your health care provider.   Document Released: 09/07/2001 Document Revised: 08/02/2014 Document Reviewed: 01/19/2014 Elsevier Interactive Patient Education 2016 [employer].     IF you received an x-ray today, you will receive an invoice from [facility]. Please contact [facility] at [phone] with questions or concerns regarding your invoice.   IF you received labwork today, you will receive an invoice from [employer] [facility]. Please contact [facility] at [phone] with questions or concerns regarding your invoice.   Our billing staff will not be able to assist you with questions regarding bills from these companies.  You will be  contacted with the lab results as soon as they are available. The fastest way to get your results is to activate your My Chart account. Instructions are located on the last page of this paperwork. If you have not heard from [place] regarding the results in 2 weeks, please contact this office.

## 2015-11-03 NOTE — Progress Notes (Signed)
Subjective:  By signing my name below, I, Mackenzie Everett, attest that this documentation has been prepared under the direction and in the presence of Meredith Staggers, MD.  Electronically Signed: Andrew Au, ED Scribe. 11/01/2015. 5:27 PM.   Patient ID: Mackenzie Everett, female    DOB: Dec 20, 1960, 55 y.o.   MRN: [id number]  HPI Chief Complaint  Patient presents with   Foot Pain    RIGHT FOOT PAIN    HPI Comments: Mackenzie Everett is a 55 y.o. female who presents to the [facility] complaining of right foot pain. Pt rolled/inverted right foot yesterday while walking. She iced and elevated foot last night but woke up this morning with worsening pain. She has worsening pain with bearing weight and walking. Due to her dieretic she is unable to take NSAIDS. She has been on hydrocodone in the past. She denies addiction or difficult coming off medication in the past. She has been placed in a boot in the past for plantar fascitis.    Patient Active Problem List   Diagnosis Date Noted   Obesity, Class III, BMI 40-49.9 (morbid obesity) (HCC) 09/04/2011   Hyperlipidemia 09/04/2011   Migraine variant 09/04/2011   Asthma with allergic rhinitis 09/04/2011   Degenerative disc disease, lumbar 09/04/2011   Past Medical History:  Diagnosis Date   Allergy    Arthritis    hands and knees   Asthma    Hyperlipidemia 09/04/2011   Hypertension    Past Surgical History:  Procedure Laterality Date   APPENDECTOMY     BREAST SURGERY     KNEE SURGERY     1994   TUBAL LIGATION     Allergies  Allergen Reactions   Caffeine Other (See Comments)    migraines   Penicillins Hives   Prior to Admission medications   Medication Sig Start Date End Date Taking? Authorizing Provider  amLODipine (NORVASC) 5 MG tablet Take 1 tablet (5 mg total) by mouth daily. 10/06/15  Yes [name], MD  desloratadine (CLARINEX) 5 MG tablet Take 1 tablet (5 mg total) by mouth daily. 02/01/15  Yes  Stephanie D English, PA  hydrochlorothiazide (HYDRODIURIL) 12.5 MG tablet Take 1 tablet (12.5 mg total) by mouth daily. 10/06/15  Yes [name], MD   Social History   Social History   Marital status: Single    Spouse name: N/A   Number of children: N/A   Years of education: N/A   Occupational History   Not on file.   Social History Main Topics   Smoking status: Never Smoker   Smokeless tobacco: Never Used   Alcohol use Yes     Comment: social   Drug use: No   Sexual activity: Yes    Birth control/ protection: None   Other Topics Concern   Not on file   Social History Narrative   Lives at home alone   Review of Systems  Musculoskeletal: Positive for arthralgias and myalgias.  Skin: Negative for color change, rash and wound.  Neurological: Negative for weakness and numbness.    Objective:   Physical Exam  Constitutional: She is oriented to person, place, and time. She appears well-developed and well-nourished. No distress.  HENT:  Head: Normocephalic and atraumatic.  Eyes: Conjunctivae are normal.  Neck: Neck supple.  Cardiovascular: Normal rate.   Pulmonary/Chest: Effort normal.  Musculoskeletal:  Right knee, tib-fib and ankle non tender. Tenderness along proximal 5th MT. Skin intact without apparent swelling. Remainder  MT are non tender. NVI distally. Navicula non-tender.   Neurological: She is alert and oriented to person, place, and time.  Skin: Skin is warm and dry.  Psychiatric: She has a normal mood and affect. Her behavior is normal.  Vitals reviewed.   Vitals:   11/03/15 1710  BP: 126/84  Pulse: 93  Resp: 18  Temp: 98.2 F (36.8 C)  TempSrc: Oral  SpO2: 100%  Weight: (!) 319 lb (144.7 kg)  Height:  (1.778 m)   Dg Foot Complete Right  Result Date: 11/03/2015 CLINICAL DATA:  55 year old female status post twisting injury while walking yesterday. Pain. Initial encounter. EXAM: RIGHT FOOT COMPLETE - 3+ VIEW COMPARISON:  Right foot  series [id number]. FINDINGS: Calcaneus is intact with chronic degenerative spurring. Overall bone mineralization is within normal limits. Everett chronic ossific fragment adjacent to the base of the fifth metatarsal is unchanged. Tarsal bone degenerative spurring again is chronic and age advanced. Stable mild first MTP joint space loss. Distal joint spaces are within normal limits. No acute osseous abnormality identified. IMPRESSION: Chronically age advanced midfoot degenerative changes. No acute fracture or dislocation identified. Electronically Signed   By: Odessa Fleming M.D.   On: 11/03/2015 18:11   Assessment & Plan:   Mackenzie Everett is a 55 y.o. female Right foot pain - Plan: DG Foot Complete Right, Apply cam walker, HYDROcodone-acetaminophen (NORCO/VICODIN) 5-325 MG tablet  Right foot sprain, initial encounter - Plan: Apply cam walker, HYDROcodone-acetaminophen (NORCO/VICODIN) 5-325 MG tablet   - No apparent fracture on x-ray, but based on location of pain over proximal fifth metatarsal, will place in short Cam Walker, with recheck exam plus or minus x-ray in approximate 7-10 days. Elevation as needed at home, range of motion outside of boot as tolerated, short course of hydrocodone if needed for breakthrough pain, or Tylenol if milder pain. RTC precautions if worsening.  Meds ordered this encounter  Medications   HYDROcodone-acetaminophen (NORCO/VICODIN) 5-325 MG tablet    Sig: Take 1 tablet by mouth every 6 (six) hours as needed for moderate pain.    Dispense:  15 tablet    Refill:  0   Patient Instructions   There was no sign of fracture on x-ray, but there were some signs of wear and tear/arthritis. Based on the location of your pain though, I would like you to wear the walking boot until recheck in the next 1 week to 10 days. If any worsening of pain in that time period, can recheck sooner. Can try Tylenol over-the-counter, or for more severe pain, hydrocodone up to every 6 hours. Elevate foot when  seated, and if you are able to come out of the brace once or twice a day for range of motion at the ankle, that is okay.  Return to the clinic or go to the nearest emergency room if any of your symptoms worsen or new symptoms occur.  Foot Sprain A foot sprain is an injury to one of the strong bands of tissue (ligaments) that connect and support the many bones in your feet. The ligament can be stretched too much or it can tear. A tear can be either partial or complete. The severity of the sprain depends on how much of the ligament was damaged or torn. CAUSES A foot sprain is usually caused by suddenly twisting or pivoting your foot. RISK FACTORS This injury is more likely to occur in people who:  Play a sport, such as basketball or football.  Exercise or play  a sport without warming up.  Start a new workout or sport.  Suddenly increase how long or hard they exercise or play a sport. SYMPTOMS Symptoms of this condition start soon after an injury and include:  Pain, especially in the arch of the foot.  Bruising.  Swelling.  Inability to walk or use the foot to support body weight. DIAGNOSIS This condition is diagnosed with a medical history and physical exam. You may also have imaging tests, such as:  X-rays to make sure there are no broken bones (fractures).  MRI to see if the ligament has torn. TREATMENT Treatment varies depending on the severity of your sprain. Mild sprains can be treated with rest, ice, compression, and elevation (RICE). If your ligament is overstretched or partially torn, treatment usually involves keeping your foot in a fixed position (immobilization) for a period of time. To help you do this, your health care provider will apply a bandage, splint, or walking boot to keep your foot from moving until it heals. You may also be advised to use crutches or a scooter for a few weeks to avoid bearing weight on your foot while it is healing. If your ligament is fully  torn, you may need surgery to reconnect the ligament to the bone. After surgery, a cast or splint will be applied and will need to stay on your foot while it heals. Your health care provider may also suggest exercises or physical therapy to strengthen your foot. HOME CARE INSTRUCTIONS If You Have a Bandage, Splint, or Walking Boot:  Wear it as directed by your health care provider. Remove it only as directed by your health care provider.  Loosen the bandage, splint, or walking boot if your toes become numb and tingle, or if they turn cold and blue. Bathing  If your health care provider approves bathing and showering, cover the bandage or splint with a watertight plastic bag to protect it from water. Do not let the bandage or splint get wet. Managing Pain, Stiffness, and Swelling   If directed, apply ice to the injured area:  Put ice in a plastic bag.  Place a towel between your skin and the bag.  Leave the ice on for 20 minutes, 2-3 times per day.  Move your toes often to avoid stiffness and to lessen swelling.  Raise (elevate) the injured area above the level of your heart while you are sitting or lying down. Driving  Do not drive or operate heavy machinery while taking pain medicine.  Do not drive while wearing a bandage, splint, or walking boot on a foot that you use for driving. Activity  Rest as directed by your health care provider.  Do not use the injured foot to support your body weight until your health care provider says that you can. Use crutches or other supportive devices as directed by your health care provider.  Ask your health care provider what activities are safe for you. Gradually increase how much and how far you walk until your health care provider says it is safe to return to full activity.  Do any exercise or physical therapy as directed by your health care provider. General Instructions  If a splint was applied, do not put pressure on any part of it  until it is fully hardened. This may take several hours.  Take medicines only as directed by your health care provider. These include over-the-counter medicines and prescription medicines.  Keep all follow-up visits as directed by your health care  provider. This is important.  When you can walk without pain, wear supportive shoes that have stiff soles. Do not wear flip-flops, and do not walk barefoot. SEEK MEDICAL CARE IF:  Your pain is not controlled with medicine.  Your bruising or swelling gets worse or does not get better with treatment.  Your splint or walking boot is damaged. SEEK IMMEDIATE MEDICAL CARE IF:  Your foot is numb or blue.  Your foot feels colder than normal.   This information is not intended to replace advice given to you by your health care provider. Make sure you discuss any questions you have with your health care provider.   Document Released: 09/07/2001 Document Revised: 08/02/2014 Document Reviewed: 01/19/2014 Elsevier Interactive Patient Education 2016 [employer].     IF you received an x-ray today, you will receive an invoice from [facility]. Please contact [facility] at [phone] with questions or concerns regarding your invoice.   IF you received labwork today, you will receive an invoice from [employer]. Please contact [facility] at [phone] with questions or concerns regarding your invoice.   Our billing staff will not be able to assist you with questions regarding bills from these companies.  You will be contacted with the lab results as soon as they are available. The fastest way to get your results is to activate your My Chart account. Instructions are located on the last page of this paperwork. If you have not heard from [place] regarding the results in 2 weeks, please contact this office.        I personally performed the services described in this documentation, which was scribed in my  presence. The recorded information has been reviewed and considered, and addended by me as needed.   Signed,   Meredith Staggers, MD [facility].  11/03/15 6:53 PM

## 2015-11-24 ENCOUNTER — Ambulatory Visit (INDEPENDENT_AMBULATORY_CARE_PROVIDER_SITE_OTHER): Payer: BC Managed Care – PPO

## 2015-11-24 ENCOUNTER — Encounter: Payer: Self-pay | Admitting: Physician Assistant

## 2015-11-24 ENCOUNTER — Ambulatory Visit (INDEPENDENT_AMBULATORY_CARE_PROVIDER_SITE_OTHER): Payer: BC Managed Care – PPO | Admitting: Physician Assistant

## 2015-11-24 VITALS — BP 130/78 | HR 71 | Temp 98.1°F | Resp 16 | Ht 70.0 in | Wt 317.4 lb

## 2015-11-24 DIAGNOSIS — M79671 Pain in right foot: Secondary | ICD-10-CM | POA: Diagnosis not present

## 2015-11-24 DIAGNOSIS — S93601D Unspecified sprain of right foot, subsequent encounter: Secondary | ICD-10-CM

## 2015-11-24 NOTE — Progress Notes (Signed)
Patient ID: Mackenzie DukesKaren R Everett, female    DOB: 1961/03/21, 55 y.o.   MRN: [id number]  PCP: Janace HoardHOPPER,DAVID, MD  Subjective:   Chief Complaint  Patient presents with  . Follow-up     right foot    HPI Presents for evaluation of RIGHT foot pain, following a sprain 8/04.  Dr. Paralee CancelGreene's note is reviewed. Radiographs revealed no fracture. She was placed in a short CAM walker and advised to RTC for repeat films in several weeks.  Difficulty sleeping, getting comfortable.  Overall, her pain is improved, but still has pain. Occasionally has pain at rest, but mostly it's when she has the foot in a dependent position or when weight bearing. No swelling. Takes hydrocodone at night (had to cut in half due to "crazy dreams" with whole tablet) only when she has severe pain, about 3 times/week.   Review of Systems Ankle pain, as above.    Patient Active Problem List   Diagnosis Date Noted  . Obesity, Class III, BMI 40-49.9 (morbid obesity) (HCC) 09/04/2011  . Hyperlipidemia 09/04/2011  . Migraine variant 09/04/2011  . Asthma with allergic rhinitis 09/04/2011  . Degenerative disc disease, lumbar 09/04/2011     Prior to Admission medications   Medication Sig Start Date End Date Taking? Authorizing Provider  amLODipine (NORVASC) 5 MG tablet Take 1 tablet (5 mg total) by mouth daily. 10/06/15  Yes Collene GobbleSteven A Daub, MD  desloratadine (CLARINEX) 5 MG tablet Take 1 tablet (5 mg total) by mouth daily. 02/01/15  Yes Stephanie D English, PA  hydrochlorothiazide (HYDRODIURIL) 12.5 MG tablet Take 1 tablet (12.5 mg total) by mouth daily. 10/06/15  Yes Collene GobbleSteven A Daub, MD  HYDROcodone-acetaminophen (NORCO/VICODIN) 5-325 MG tablet Take 1 tablet by mouth every 6 (six) hours as needed for moderate pain. 11/03/15  Yes Shade FloodJeffrey R Greene, MD     Allergies  Allergen Reactions  . Caffeine Other (See Comments)    migraines  . Penicillins Hives       Objective:  Physical Exam  Constitutional: She is oriented to  person, place, and time. She appears well-developed and well-nourished. She is active and cooperative. No distress.  BP 130/78 (BP Location: Right Arm, Patient Position: Sitting, Cuff Size: Large)   Pulse 71   Temp 98.1 F (36.7 C)   Resp 16   Ht 5\' 10"  (1.778 m)   Wt (!) 317 lb 6.4 oz (144 kg)   SpO2 99%   BMI 45.54 kg/m    Eyes: Conjunctivae are normal.  Pulmonary/Chest: Effort normal.  Musculoskeletal:       Right knee: Normal.       Right ankle: Tenderness. Lateral malleolus, AITFL and CF ligament tenderness found. No medial malleolus, no posterior TFL, no head of 5th metatarsal and no proximal fibula tenderness found. Achilles tendon normal.       Right lower leg: Normal.       Right foot: There is tenderness and swelling (mild). There is normal range of motion, normal capillary refill, no crepitus, no deformity and no laceration.  Neurological: She is alert and oriented to person, place, and time.  Psychiatric: She has a normal mood and affect. Her speech is normal and behavior is normal.      Dg Ankle Complete Right  Result Date: 11/24/2015 CLINICAL DATA:  Ankle sprain.  Pain. EXAM: RIGHT ANKLE - COMPLETE 3+ VIEW COMPARISON:  11/03/2015. FINDINGS: Diffuse soft tissue swelling. No acute bony or joint abnormality identified. No evidence of fracture or dislocation. Diffuse  degenerative change. IMPRESSION: Diffuse soft tissue swelling. Diffuse degenerative change. No acute bony abnormality. Electronically Signed   By: Maisie Fus  Register   On: 11/24/2015 11:27   Dg Foot Complete Right  Result Date: 11/03/2015 CLINICAL DATA:  55 year old female status post twisting injury while walking yesterday. Pain. Initial encounter. EXAM: RIGHT FOOT COMPLETE - 3+ VIEW COMPARISON:  Right foot series [id number]. FINDINGS: Calcaneus is intact with chronic degenerative spurring. Overall bone mineralization is within normal limits. Small chronic ossific fragment adjacent to the base of the fifth metatarsal is  unchanged. Tarsal bone degenerative spurring again is chronic and age advanced. Stable mild first MTP joint space loss. Distal joint spaces are within normal limits. No acute osseous abnormality identified. IMPRESSION: Chronically age advanced midfoot degenerative changes. No acute fracture or dislocation identified. Electronically Signed   By: Odessa Fleming M.D.   On: 11/03/2015 18:11        Assessment & Plan:   1. Right foot pain 2. Right foot sprain, subsequent encounter Reassuring radiographs. Continue supportive care and CAM walker. RTC 2-3 weeks for re-evaluation with myself or Dr. Neva Seat. - DG Ankle Complete Right; Future   Fernande Bras, PA-C Physician Assistant-Certified [facility]

## 2015-11-24 NOTE — Patient Instructions (Addendum)
There is no fracture on the xrays, which is reassuring. This most likely represents a more significant sprain, that will require longer to resolve.  Please continue the CAM walker (boot), and plan to return for re-evaluation with myself or Dr. Neva SeatGreene in 2-3 weeks.    IF you received an x-ray today, you will receive an invoice from [facility]. Please contact [facility] at [phone] with questions or concerns regarding your invoice.   IF you received labwork today, you will receive an invoice from [facility]. Please contact [facility] at [phone] with questions or concerns regarding your invoice.   Our billing staff will not be able to assist you with questions regarding bills from these companies.  You will be contacted with the lab results as soon as they are available. The fastest way to get your results is to activate your My Chart account. Instructions are located on the last page of this paperwork. If you have not heard from us regarding the results in 2 weeks, please contact this office.

## 2016-01-19 ENCOUNTER — Encounter: Payer: Self-pay | Admitting: Family Medicine

## 2016-01-19 DIAGNOSIS — B1920 Unspecified viral hepatitis C without hepatic coma: Secondary | ICD-10-CM | POA: Insufficient documentation

## 2016-02-02 ENCOUNTER — Other Ambulatory Visit: Payer: Self-pay | Admitting: Physician Assistant

## 2016-04-29 ENCOUNTER — Ambulatory Visit (INDEPENDENT_AMBULATORY_CARE_PROVIDER_SITE_OTHER): Payer: BC Managed Care – PPO

## 2016-04-29 ENCOUNTER — Ambulatory Visit (INDEPENDENT_AMBULATORY_CARE_PROVIDER_SITE_OTHER): Payer: BC Managed Care – PPO | Admitting: Urgent Care

## 2016-04-29 VITALS — BP 130/72 | HR 85 | Temp 98.4°F | Ht 70.0 in | Wt 319.0 lb

## 2016-04-29 DIAGNOSIS — W19XXXA Unspecified fall, initial encounter: Secondary | ICD-10-CM | POA: Diagnosis not present

## 2016-04-29 DIAGNOSIS — M25531 Pain in right wrist: Secondary | ICD-10-CM | POA: Diagnosis not present

## 2016-04-29 DIAGNOSIS — M1712 Unilateral primary osteoarthritis, left knee: Secondary | ICD-10-CM | POA: Diagnosis not present

## 2016-04-29 DIAGNOSIS — Z23 Encounter for immunization: Secondary | ICD-10-CM | POA: Diagnosis not present

## 2016-04-29 DIAGNOSIS — M25562 Pain in left knee: Secondary | ICD-10-CM

## 2016-04-29 DIAGNOSIS — M79641 Pain in right hand: Secondary | ICD-10-CM | POA: Diagnosis not present

## 2016-04-29 MED ORDER — NAPROXEN SODIUM 550 MG PO TABS: 550.0000 mg | ORAL_TABLET | Freq: Two times a day (BID) | ORAL | 1 refills | Status: DC

## 2016-04-29 NOTE — Progress Notes (Signed)
MRN: [id number] DOB: 01/29/1961  Subjective:   Mackenzie DukesKaren R Everett is a 56 y.o. female presenting for chief complaint of Hand Pain (X 4 days due to a fall) and Knee Pain (X 4 day due to a fall)  Reports having suffered a fall 4 days ago. Broke her fall with her right hand and left knee. She has been taking APAP consistently for pain and inflammation, icing in the following 2 days after her fall. However, her pain is not improving. Her right hand pain is intermittent, sharp over palm and achy over her wrist, worse with typing and writing. She notes a bruise and had some swelling (which is now improved). The left knee pain is elicited by touch, feels sharp occasionally, has had redness and swelling (both of which are now improved). Patient has known history of arthritis in her left knee. She also has known CTS, tingling sensation has not worsened for her right hand.   Mackenzie Everett has a current medication list which includes the following prescription(s): amlodipine, desloratadine, hydrochlorothiazide, and hydrocodone-acetaminophen. Also is allergic to caffeine and penicillins.  Mackenzie Everett  has a past medical history of Allergy; Arthritis; Asthma; Hyperlipidemia (09/04/2011); and Hypertension. Also  has a past surgical history that includes Appendectomy; Breast surgery; Tubal ligation; and Knee surgery.  Objective:   Vitals: BP 130/72 (BP Location: Right Arm, Patient Position: Sitting, Cuff Size: Large)   Pulse 85   Temp 98.4 F (36.9 C) (Oral)   Ht 5\' 10"  (1.778 m)   Wt (!) 319 lb (144.7 kg)   SpO2 99%   BMI 45.77 kg/m   BP Readings from Last 3 Encounters:  04/29/16 130/72  11/24/15 130/78  11/03/15 126/84    Physical Exam  Constitutional: She is oriented to person, place, and time. She appears well-developed and well-nourished.  Cardiovascular: Normal rate.   Pulmonary/Chest: Effort normal.  Musculoskeletal:       Right wrist: She exhibits tenderness (over dorsal/ulnar aspect of wrist). She exhibits  normal range of motion, no bony tenderness, no swelling, no effusion, no crepitus, no deformity and no laceration.       Left knee: She exhibits swelling (trace edema). She exhibits normal range of motion, no effusion, no ecchymosis, no deformity, no erythema, normal alignment and normal patellar mobility. Tenderness found. Medial joint line, lateral joint line and patellar tendon tenderness noted.  Neurological: She is alert and oriented to person, place, and time.   Dg Wrist Complete Right  Result Date: 04/29/2016 CLINICAL DATA:  Wrist pain status post fall.  Initial encounter. EXAM: RIGHT WRIST - COMPLETE 3+ VIEW COMPARISON:  None. FINDINGS: The mineralization and alignment are normal. There is no evidence of acute fracture or dislocation. Minimal degenerative changes are present at the [facility]. No erosive changes or focal soft tissue abnormalities are seen. IMPRESSION: No acute osseous findings. Electronically Signed   By: Carey BullocksWilliam  Veazey M.D.   On: 04/29/2016 14:32   Dg Knee Complete 4 Views Left  Result Date: 04/29/2016 CLINICAL DATA:  Acute left knee pain after falling. History of arthritis. Initial encounter. EXAM: LEFT KNEE - COMPLETE 4+ VIEW COMPARISON:  Lower leg radiographs 01/27/2004. FINDINGS: The mineralization and alignment are normal. There is no evidence of acute fracture or dislocation. There are progressive tricompartmental degenerative changes. 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 No significant joint effusion. IMPRESSION: No acute osseous findings. 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  Electronically Signed   By: Carey Bullocks M.D.   On: 04/29/2016 14:31   Assessment and Plan :   1. Wrist pain, acute, right 2. Right hand pain 3. Fall, initial encounter 4. Acute pain of left knee 5. Arthritis of left knee - Start Anaprox  for pain and inflammation associated with her fall. Recommended conservative management. Counseled her on potential for adverse effects.   6. Need for prophylactic vaccination and inoculation against influenza - Flu Vaccine QUAD 36+ mos IM  [name], PA-C [facility] [phone] 04/29/2016  1:45 PM

## 2016-04-29 NOTE — Patient Instructions (Addendum)
Contusion Introduction A contusion is a deep bruise. Contusions happen when an injury causes bleeding under the skin. Symptoms of bruising include pain, swelling, and discolored skin. The skin may turn blue, purple, or yellow. Follow these instructions at home:  Rest the injured area.  If told, put ice on the injured area.  Put ice in a plastic bag.  Place a towel between your skin and the bag.  Leave the ice on for 20 minutes, 2-3 times per day.  If told, put light pressure (compression) on the injured area using an elastic bandage. Make sure the bandage is not too tight. Remove it and put it back on as told by your doctor.  If possible, raise (elevate) the injured area above the level of your heart while you are sitting or lying down.  Take over-the-counter and prescription medicines only as told by your doctor. Contact a doctor if:  Your symptoms do not get better after several days of treatment.  Your symptoms get worse.  You have trouble moving the injured area. Get help right away if:  You have very bad pain.  You have a loss of feeling (numbness) in a hand or foot.  Your hand or foot turns pale or cold. This information is not intended to replace advice given to you by your health care provider. Make sure you discuss any questions you have with your health care provider. Document Released: 09/04/2007 Document Revised: 08/24/2015 Document Reviewed: 08/03/2014  2017 Elsevier     IF you received an x-ray today, you will receive an invoice from [facility]. Please contact [facility] at [phone] with questions or concerns regarding your invoice.   IF you received labwork today, you will receive an invoice from [facility]. Please contact [facility] at [phone] with questions or concerns regarding your invoice.   Our billing staff will not be able to assist you with questions regarding bills from these companies.  You will be contacted with the  lab results as soon as they are available. The fastest way to get your results is to activate your My Chart account. Instructions are located on the last page of this paperwork. If you have not heard from us regarding the results in 2 weeks, please contact this office.

## 2016-05-16 ENCOUNTER — Ambulatory Visit: Payer: BC Managed Care – PPO | Admitting: Urgent Care

## 2016-07-30 ENCOUNTER — Encounter: Payer: Self-pay | Admitting: Family Medicine

## 2016-07-30 ENCOUNTER — Ambulatory Visit (INDEPENDENT_AMBULATORY_CARE_PROVIDER_SITE_OTHER): Payer: BC Managed Care – PPO | Admitting: Family Medicine

## 2016-07-30 VITALS — BP 140/83 | HR 73 | Temp 97.8°F | Resp 16 | Ht 69.5 in | Wt 321.6 lb

## 2016-07-30 DIAGNOSIS — J3489 Other specified disorders of nose and nasal sinuses: Secondary | ICD-10-CM

## 2016-07-30 DIAGNOSIS — Z8709 Personal history of other diseases of the respiratory system: Secondary | ICD-10-CM | POA: Diagnosis not present

## 2016-07-30 DIAGNOSIS — J309 Allergic rhinitis, unspecified: Secondary | ICD-10-CM

## 2016-07-30 MED ORDER — AZITHROMYCIN 250 MG PO TABS: ORAL_TABLET | ORAL | 0 refills | Status: DC

## 2016-07-30 MED ORDER — DESLORATADINE 5 MG PO TABS: ORAL_TABLET | ORAL | 1 refills | Status: DC

## 2016-07-30 NOTE — Progress Notes (Signed)
By signing my name below, I, Mesha Guinyard, attest that this documentation has been prepared under the direction and in the presence of Meredith Staggers, MD.  Electronically Signed: Arvilla Market, Medical Scribe. 07/30/16. 3:19 PM.  Subjective:    Patient ID: Mackenzie Everett, female    DOB: 1960/12/27, 56 y.o.   MRN: [id number]  HPI Chief Complaint  Patient presents with  . Blowing nose    green mucus, symptoms started on 07/27/16, mucus started on 07/29/16  . Medication Refill    Clarinex 5 mg    HPI Comments: Mackenzie Everett is a 56 y.o. female who presents to the Primary Care at [facility] complaining of nasal congestion 2 days ago. Reports post nasal drip, itchy eyes, runny eyes, itchy skin with pollen to skin contact, and green sputum production. Took OTC decongestant without relief of her sxs, flonase with relief, and clarinex. Denies sick contacts. Denies fever, tooth/face/head pain, HA, trouble breathing, and hearing loss.  Patient Active Problem List   Diagnosis Date Noted  . Hepatitis C 01/19/2016  . Obesity, Class III, BMI 40-49.9 (morbid obesity) (HCC) 09/04/2011  . Hyperlipidemia 09/04/2011  . Migraine variant 09/04/2011  . Asthma with allergic rhinitis 09/04/2011  . Degenerative disc disease, lumbar 09/04/2011   Past Medical History:  Diagnosis Date  . Allergy   . Arthritis    hands and knees  . Asthma   . Hyperlipidemia 09/04/2011  . Hypertension    Past Surgical History:  Procedure Laterality Date  . APPENDECTOMY    . BREAST SURGERY    . KNEE SURGERY     1994  . TUBAL LIGATION     Allergies  Allergen Reactions  . Caffeine Other (See Comments)    migraines  . Penicillins Hives   Prior to Admission medications   Medication Sig Start Date End Date Taking? Authorizing Provider  amLODipine (NORVASC) 5 MG tablet Take 1 tablet (5 mg total) by mouth daily. 10/06/15  Yes [name], MD  desloratadine (CLARINEX) 5 MG tablet TAKE 1 TABLET(5 MG) BY MOUTH DAILY 02/05/16   Yes Collie Siad English, PA  hydrochlorothiazide (HYDRODIURIL) 12.5 MG tablet Take 1 tablet (12.5 mg total) by mouth daily. 10/06/15  Yes [name], MD  HYDROcodone-acetaminophen (NORCO/VICODIN) 5-325 MG tablet Take 1 tablet by mouth every 6 (six) hours as needed for moderate pain. 11/03/15  Yes Shade Flood, MD  naproxen sodium (ANAPROX DS) 550 MG tablet Take 1 tablet (550 mg total) by mouth 2 (two) times daily with a meal. 04/29/16  Yes [name], PA-C   Social History   Social History  . Marital status: Single    Spouse name: N/A  . Number of children: N/A  . Years of education: N/A   Occupational History  . Not on file.   Social History Main Topics  . Smoking status: Never Smoker  . Smokeless tobacco: Never Used  . Alcohol use Yes     Comment: social  . Drug use: No  . Sexual activity: Yes    Birth control/ protection: None   Other Topics Concern  . Not on file   Social History Narrative   Lives at home alone   Review of Systems  Constitutional: Negative for fever.  HENT: Positive for congestion and postnasal drip. Negative for dental problem, hearing loss and sinus pain.   Eyes: Positive for discharge and itching.  Respiratory: Negative for shortness of breath, wheezing and stridor.   Allergic/Immunologic: Positive for environmental  allergies.  Neurological: Negative for headaches.   Objective:  Physical Exam  Constitutional: She is oriented to person, place, and time. She appears well-developed and well-nourished. No distress.  HENT:  Head: Normocephalic and atraumatic.  Right Ear: Hearing, tympanic membrane, external ear and ear canal normal.  Left Ear: Hearing, tympanic membrane, external ear and ear canal normal.  Nose: Nose normal. No nasal septal hematoma. Right sinus exhibits no maxillary sinus tenderness and no frontal sinus tenderness. Left sinus exhibits no maxillary sinus tenderness and no frontal sinus tenderness.  Mouth/Throat: Oropharynx is clear  and moist. No oropharyngeal exudate.  Edema of turbinates bilaterally Clear discharge No bleeding Sinuses non tender  Eyes: Conjunctivae and EOM are normal. Pupils are equal, round, and reactive to light. Right conjunctiva is not injected. Left conjunctiva is not injected.  Cardiovascular: Normal rate, regular rhythm, normal heart sounds and intact distal pulses.  Exam reveals no gallop and no friction rub.   No murmur heard. Pulmonary/Chest: Effort normal and breath sounds normal. No respiratory distress. She has no wheezes. She has no rhonchi. She has no rales.  Neurological: She is alert and oriented to person, place, and time.  Skin: Skin is warm and dry. No rash noted.  Psychiatric: She has a normal mood and affect. Her behavior is normal.  Vitals reviewed.   Vitals:   07/30/16 1451 07/30/16 1506  BP: (!) 144/77 140/83  Pulse: 73   Resp: 16   Temp: 97.8 F (36.6 C)   TempSrc: Oral   SpO2: 100%   Weight: (!) 321 lb 9.6 oz (145.9 kg)   Height: 5' 9.5" (1.765 m)   Body mass index is 46.81 kg/m. Assessment & Plan:   Mackenzie Everett is a 56 y.o. female Allergic rhinitis, unspecified seasonality, unspecified trigger - Plan: Care order/instruction:  Purulent nasal discharge - Plan: azithromycin (ZITHROMAX) 250 MG tablet  History of sinusitis  Purulent nasal discharge for a few days, but no other signs of sinusitis at this time. Suspected allergic cause.  -Saline nasal spray, fluids, short-term topical decongestant only if needed.  -Continue Flonase, Clarinex for allergies.   -Prescription for azithromycin given as this treated her last sinus infection, but discussed this does not currently look like sinus infection. Timing of antibiotic was discussed if needed, RTC precautions given.   Meds ordered this encounter  Medications  . azithromycin (ZITHROMAX) 250 MG tablet    Sig: Take 2 pills by mouth on day 1, then 1 pill by mouth per day on days 2 through 5.    Dispense:  6  tablet    Refill:  0  . desloratadine (CLARINEX) 5 MG tablet    Sig: TAKE 1 TABLET(5 MG) BY MOUTH DAILY    Dispense:  90 tablet    Refill:  1    Ov needed   Patient Instructions   Saline nasal spray atleast 4 times per day, over the counter afrin if needed for up to 3 days only.  Drink plenty of fluids. Continue Clarinex once per day, and Flonase nasal spray each day for allergies. See other information on allergies below. If discolored nasal discharge is not improving within the next week, or worsening of symptoms sooner, can start antibiotic as discussed. However if symptoms still do not improve within a week to 10 days of using the antibiotic, would recommend recheck to determine if other medicines are needed.  Allergic Rhinitis Allergic rhinitis is when the mucous membranes in the nose respond to allergens. Allergens  are particles in the air that cause your body to have an allergic reaction. This causes you to release allergic antibodies. Through a chain of events, these eventually cause you to release histamine into the blood stream. Although meant to protect the body, it is this release of histamine that causes your discomfort, such as frequent sneezing, congestion, and an itchy, runny nose. What are the causes? Seasonal allergic rhinitis (hay fever) is caused by pollen allergens that may come from grasses, trees, and weeds. Year-round allergic rhinitis (perennial allergic rhinitis) is caused by allergens such as house dust mites, pet dander, and mold spores. What are the signs or symptoms?  Nasal stuffiness (congestion).  Itchy, runny nose with sneezing and tearing of the eyes. How is this diagnosed? Your health care provider can help you determine the allergen or allergens that trigger your symptoms. If you and your health care provider are unable to determine the allergen, skin or blood testing may be used. Your health care provider will diagnose your condition after taking your health  history and performing a physical exam. Your health care provider may assess you for other related conditions, such as asthma, pink eye, or an ear infection. How is this treated? Allergic rhinitis does not have a cure, but it can be controlled by:  Medicines that block allergy symptoms. These may include allergy shots, nasal sprays, and oral antihistamines.  Avoiding the allergen. Hay fever may often be treated with antihistamines in pill or nasal spray forms. Antihistamines block the effects of histamine. There are over-the-counter medicines that may help with nasal congestion and swelling around the eyes. Check with your health care provider before taking or giving this medicine. If avoiding the allergen or the medicine prescribed do not work, there are many new medicines your health care provider can prescribe. Stronger medicine may be used if initial measures are ineffective. Desensitizing injections can be used if medicine and avoidance does not work. Desensitization is when a patient is given ongoing shots until the body becomes less sensitive to the allergen. Make sure you follow up with your health care provider if problems continue. Follow these instructions at home: It is not possible to completely avoid allergens, but you can reduce your symptoms by taking steps to limit your exposure to them. It helps to know exactly what you are allergic to so that you can avoid your specific triggers. Contact a health care provider if:  You have a fever.  You develop a cough that does not stop easily (persistent).  You have shortness of breath.  You start wheezing.  Symptoms interfere with normal daily activities. This information is not intended to replace advice given to you by your health care provider. Make sure you discuss any questions you have with your health care provider. Document Released: 12/11/2000 Document Revised: 11/17/2015 Document Reviewed: 11/23/2012 Elsevier Interactive  Patient Education  2017 [employer].    IF you received an x-ray today, you will receive an invoice from [facility]. Please contact [facility] at [phone] with questions or concerns regarding your invoice.   IF you received labwork today, you will receive an invoice from [facility]. Please contact [facility] at [phone] with questions or concerns regarding your invoice.   Our billing staff will not be able to assist you with questions regarding bills from these companies.  You will be contacted with the lab results as soon as they are available. The fastest way to get your results is to activate your My Chart account.  Instructions are located on the last page of this paperwork. If you have not heard from [place] regarding the results in 2 weeks, please contact this office.      I personally performed the services described in this documentation, which was scribed in my presence. The recorded information has been reviewed and considered for accuracy and completeness, addended by me as needed, and agree with information above.  Signed,   Meredith Staggers, MD Primary Care at [facility].  07/30/16 4:07 PM

## 2016-07-30 NOTE — Patient Instructions (Addendum)
Saline nasal spray atleast 4 times per day, over the counter afrin if needed for up to 3 days only.  Drink plenty of fluids. Continue Clarinex once per day, and Flonase nasal spray each day for allergies. See other information on allergies below. If discolored nasal discharge is not improving within the next week, or worsening of symptoms sooner, can start antibiotic as discussed. However if symptoms still do not improve within a week to 10 days of using the antibiotic, would recommend recheck to determine if other medicines are needed.  Allergic Rhinitis Allergic rhinitis is when the mucous membranes in the nose respond to allergens. Allergens are particles in the air that cause your body to have an allergic reaction. This causes you to release allergic antibodies. Through a chain of events, these eventually cause you to release histamine into the blood stream. Although meant to protect the body, it is this release of histamine that causes your discomfort, such as frequent sneezing, congestion, and an itchy, runny nose. What are the causes? Seasonal allergic rhinitis (hay fever) is caused by pollen allergens that may come from grasses, trees, and weeds. Year-round allergic rhinitis (perennial allergic rhinitis) is caused by allergens such as house dust mites, pet dander, and mold spores. What are the signs or symptoms?  Nasal stuffiness (congestion).  Itchy, runny nose with sneezing and tearing of the eyes. How is this diagnosed? Your health care provider can help you determine the allergen or allergens that trigger your symptoms. If you and your health care provider are unable to determine the allergen, skin or blood testing may be used. Your health care provider will diagnose your condition after taking your health history and performing a physical exam. Your health care provider may assess you for other related conditions, such as asthma, pink eye, or an ear infection. How is this treated? Allergic  rhinitis does not have a cure, but it can be controlled by:  Medicines that block allergy symptoms. These may include allergy shots, nasal sprays, and oral antihistamines.  Avoiding the allergen. Hay fever may often be treated with antihistamines in pill or nasal spray forms. Antihistamines block the effects of histamine. There are over-the-counter medicines that may help with nasal congestion and swelling around the eyes. Check with your health care provider before taking or giving this medicine. If avoiding the allergen or the medicine prescribed do not work, there are many new medicines your health care provider can prescribe. Stronger medicine may be used if initial measures are ineffective. Desensitizing injections can be used if medicine and avoidance does not work. Desensitization is when a patient is given ongoing shots until the body becomes less sensitive to the allergen. Make sure you follow up with your health care provider if problems continue. Follow these instructions at home: It is not possible to completely avoid allergens, but you can reduce your symptoms by taking steps to limit your exposure to them. It helps to know exactly what you are allergic to so that you can avoid your specific triggers. Contact a health care provider if:  You have a fever.  You develop a cough that does not stop easily (persistent).  You have shortness of breath.  You start wheezing.  Symptoms interfere with normal daily activities. This information is not intended to replace advice given to you by your health care provider. Make sure you discuss any questions you have with your health care provider. Document Released: 12/11/2000 Document Revised: 11/17/2015 Document Reviewed: 11/23/2012 Elsevier Interactive Patient Education  2017 Elsevier Inc.    IF you received an x-ray today, you will receive an invoice from [facility]. Please contact [facility] at [phone] with  questions or concerns regarding your invoice.   IF you received labwork today, you will receive an invoice from [facility]. Please contact [facility] at [phone] with questions or concerns regarding your invoice.   Our billing staff will not be able to assist you with questions regarding bills from these companies.  You will be contacted with the lab results as soon as they are available. The fastest way to get your results is to activate your My Chart account. Instructions are located on the last page of this paperwork. If you have not heard from [place] regarding the results in 2 weeks, please contact this office.

## 2016-10-03 ENCOUNTER — Ambulatory Visit (INDEPENDENT_AMBULATORY_CARE_PROVIDER_SITE_OTHER): Payer: BC Managed Care – PPO | Admitting: Family Medicine

## 2016-10-03 ENCOUNTER — Ambulatory Visit (INDEPENDENT_AMBULATORY_CARE_PROVIDER_SITE_OTHER): Payer: BC Managed Care – PPO

## 2016-10-03 ENCOUNTER — Encounter: Payer: Self-pay | Admitting: Family Medicine

## 2016-10-03 VITALS — BP 141/69 | HR 75 | Temp 98.0°F | Resp 16 | Ht 69.5 in | Wt 327.0 lb

## 2016-10-03 DIAGNOSIS — M79671 Pain in right foot: Secondary | ICD-10-CM

## 2016-10-03 DIAGNOSIS — M25572 Pain in left ankle and joints of left foot: Secondary | ICD-10-CM

## 2016-10-03 DIAGNOSIS — G8929 Other chronic pain: Secondary | ICD-10-CM

## 2016-10-03 MED ORDER — DICLOFENAC SODIUM 75 MG PO TBEC: 75.0000 mg | DELAYED_RELEASE_TABLET | Freq: Two times a day (BID) | ORAL | 0 refills | Status: DC

## 2016-10-03 NOTE — Patient Instructions (Addendum)
Take diclofenac (Voltaren) 75 mg one twice daily with breakfast and supper  Referral is being made to an orthopedic foot specialist, Dr. Victorino DikeHewitt.  Work hard on trying to lose weight over the coming months and years.  Return as needed      IF you received an x-ray today, you will receive an invoice from [facility]. Please contact [facility] at [phone] with questions or concerns regarding your invoice.   IF you received labwork today, you will receive an invoice from [facility]. Please contact [facility] at [phone] with questions or concerns regarding your invoice.   Our billing staff will not be able to assist you with questions regarding bills from these companies.  You will be contacted with the lab results as soon as they are available. The fastest way to get your results is to activate your My Chart account. Instructions are located on the last page of this paperwork. If you have not heard from us regarding the results in 2 weeks, please contact this office.

## 2016-10-03 NOTE — Progress Notes (Signed)
Patient ID: Mackenzie Everett, female    DOB: 1960/10/15  Age: 56 y.o. MRN: [id number]  Chief Complaint  Patient presents with  . Pain    Bilateral foot pain     Subjective:   Pleasant 56 year old lady previously known to me from a couple of years ago. Last year the patient fell off the side of a curb and injured her right foot. She had a couple of x-rays done and no fracture was noted. However she has continued to have pain in the lateral aspect of the right foot along the proximal metatarsal region. No repeat injuries. She tries to wear comfortable shoes. Has tried topicals and oral analgesics without relief. She works primarily a Office managerdesk job. It hurts too much for her to get much exercise or to walk any distance.  The left ankle has been hurting her for about 4 months. No specific injury, just pain in the ankle joint.  She is on medicines for her blood pressure and allergies.  Current allergies, medications, problem list, past/family and social histories reviewed.  Objective:  BP (!) 141/69   Pulse 75   Temp 98 F (36.7 C) (Oral)   Resp 16   Ht 5' 9.5" (1.765 m)   Wt (!) 327 lb (148.3 kg)   SpO2 100%   BMI 47.60 kg/m   No major acute distress. Overweight lady. Feet are a little edematous with grooves from the straps of her sandals. She is tender in the right foot laterally along the proximal fifth metatarsal. She is tender in the left ankle along the ankle joint line itself. Pulses are good. Color is good. Range of motion satisfactory.  Assessment & Plan:   Assessment: 1. Chronic foot pain, right   2. Chronic pain of left ankle       Plan: We'll get x-rays and proceed from there.  Orders Placed This Encounter  Procedures  . DG Ankle Complete Left    Standing Status:   Future    Number of Occurrences:   1    Standing Expiration Date:   10/03/2017    Order Specific Question:   Reason for Exam (SYMPTOM  OR DIAGNOSIS REQUIRED)    Answer:   pain 5th metatarsal area still, one  year after an injury    Order Specific Question:   Is the patient pregnant?    Answer:   No    Order Specific Question:   Preferred imaging location?    Answer:   External  . DG Foot 2 Views Right    Standing Status:   Future    Number of Occurrences:   1    Standing Expiration Date:   10/03/2017    Order Specific Question:   Reason for Exam (SYMPTOM  OR DIAGNOSIS REQUIRED)    Answer:   pain 5th metatarsal area still, one year after an injury    Order Specific Question:   Is the patient pregnant?    Answer:   No    Order Specific Question:   Preferred imaging location?    Answer:   External  . Ambulatory referral to Orthopedic Surgery    Referral Priority:   Routine    Referral Type:   Surgical    Referral Reason:   Specialty Services Required    Referred to Provider:   Toni ArthursHewitt, John, MD    Requested Specialty:   Orthopedic Surgery    Number of Visits Requested:   1    Meds ordered this encounter  Medications  . diclofenac (VOLTAREN) 75 MG EC tablet    Sig: Take 1 tablet (75 mg total) by mouth 2 (two) times daily.    Dispense:  30 tablet    Refill:  0   X-ray reports show no significant abnormality  Will refer her to an orthopedic foot specialist, treating her with some diclofenac prior to then. No easily treated problems identified.   Will check a uric acid level to make sure she doesn't have chronic gout causing that ankle problem.   Patient Instructions   Take diclofenac (Voltaren) 75 mg one twice daily with breakfast and supper  Referral is being made to an orthopedic foot specialist, Dr. Victorino Dike.  Work hard on trying to lose weight over the coming months and years.  Return as needed      IF you received an x-ray today, you will receive an invoice from [facility]. Please contact [facility] at [phone] with questions or concerns regarding your invoice.   IF you received labwork today, you will receive an invoice from [facility]. Please  contact [facility] at [phone] with questions or concerns regarding your invoice.   Our billing staff will not be able to assist you with questions regarding bills from these companies.  You will be contacted with the lab results as soon as they are available. The fastest way to get your results is to activate your My Chart account. Instructions are located on the last page of this paperwork. If you have not heard from [place] regarding the results in 2 weeks, please contact this office.         Return if symptoms worsen or fail to improve.   Hektor Huston, MD 10/03/2016

## 2016-10-04 LAB — URIC ACID: URIC ACID: 6.9 mg/dL (ref 2.5–7.1)

## 2016-10-07 ENCOUNTER — Other Ambulatory Visit: Payer: Self-pay | Admitting: Emergency Medicine

## 2016-10-07 DIAGNOSIS — I10 Essential (primary) hypertension: Secondary | ICD-10-CM

## 2016-10-12 NOTE — Telephone Encounter (Signed)
Patient has called about her med refills, states she will make appt, but is going out of town tomorrow, wants to know if Dr Neva SeatGreene will refill them one more time until she can make it in for appt.  Amlodipine and HCTZ   Please advise

## 2016-10-12 NOTE — Telephone Encounter (Signed)
I have discussed with Dr Neva SeatGreene, ok for 30 day supply but she needs a follow up appt.

## 2016-11-07 ENCOUNTER — Encounter: Payer: Self-pay | Admitting: Family Medicine

## 2016-11-07 ENCOUNTER — Ambulatory Visit (INDEPENDENT_AMBULATORY_CARE_PROVIDER_SITE_OTHER): Payer: BC Managed Care – PPO | Admitting: Family Medicine

## 2016-11-07 VITALS — BP 139/84 | HR 82 | Temp 98.4°F | Resp 16 | Ht 69.0 in | Wt 325.0 lb

## 2016-11-07 DIAGNOSIS — Z1231 Encounter for screening mammogram for malignant neoplasm of breast: Secondary | ICD-10-CM

## 2016-11-07 DIAGNOSIS — Z13220 Encounter for screening for lipoid disorders: Secondary | ICD-10-CM

## 2016-11-07 DIAGNOSIS — G8929 Other chronic pain: Secondary | ICD-10-CM

## 2016-11-07 DIAGNOSIS — Z6841 Body Mass Index (BMI) 40.0 and over, adult: Secondary | ICD-10-CM

## 2016-11-07 DIAGNOSIS — M79671 Pain in right foot: Secondary | ICD-10-CM

## 2016-11-07 DIAGNOSIS — M25572 Pain in left ankle and joints of left foot: Secondary | ICD-10-CM

## 2016-11-07 DIAGNOSIS — I10 Essential (primary) hypertension: Secondary | ICD-10-CM | POA: Diagnosis not present

## 2016-11-07 DIAGNOSIS — Z1211 Encounter for screening for malignant neoplasm of colon: Secondary | ICD-10-CM | POA: Diagnosis not present

## 2016-11-07 DIAGNOSIS — Z1239 Encounter for other screening for malignant neoplasm of breast: Secondary | ICD-10-CM

## 2016-11-07 LAB — COMPREHENSIVE METABOLIC PANEL
ALK PHOS: 95 IU/L (ref 39–117)
ALT: 13 IU/L (ref 0–32)
AST: 12 IU/L (ref 0–40)
Albumin/Globulin Ratio: 1.6 (ref 1.2–2.2)
Albumin: 4.5 g/dL (ref 3.5–5.5)
BILIRUBIN TOTAL: 0.2 mg/dL (ref 0.0–1.2)
BUN / CREAT RATIO: 17 (ref 9–23)
BUN: 15 mg/dL (ref 6–24)
CHLORIDE: 102 mmol/L (ref 96–106)
CO2: 24 mmol/L (ref 20–29)
CREATININE: 0.88 mg/dL (ref 0.57–1.00)
Calcium: 9.7 mg/dL (ref 8.7–10.2)
GFR calc Af Amer: 84 mL/min/{1.73_m2} (ref >59)
GFR calc non Af Amer: 73 mL/min/{1.73_m2} (ref >59)
GLUCOSE: 93 mg/dL (ref 65–99)
Globulin, Total: 2.9 g/dL (ref 1.5–4.5)
Potassium: 4.9 mmol/L (ref 3.5–5.2)
Sodium: 139 mmol/L (ref 134–144)
Total Protein: 7.4 g/dL (ref 6.0–8.5)

## 2016-11-07 LAB — LIPID PANEL
CHOLESTEROL TOTAL: 242 mg/dL — AB (ref 100–199)
Chol/HDL Ratio: 4.2 ratio (ref 0.0–4.4)
HDL: 57 mg/dL (ref >39)
LDL Calculated: 166 mg/dL — ABNORMAL HIGH (ref 0–99)
Triglycerides: 94 mg/dL (ref 0–149)
VLDL CHOLESTEROL CAL: 19 mg/dL (ref 5–40)

## 2016-11-07 MED ORDER — HYDROCHLOROTHIAZIDE 12.5 MG PO TABS: ORAL_TABLET | ORAL | 1 refills | Status: DC

## 2016-11-07 MED ORDER — AMLODIPINE BESYLATE 5 MG PO TABS: ORAL_TABLET | ORAL | 1 refills | Status: DC

## 2016-11-07 MED ORDER — DICLOFENAC SODIUM 75 MG PO TBEC: 75.0000 mg | DELAYED_RELEASE_TABLET | Freq: Two times a day (BID) | ORAL | 0 refills | Status: DC

## 2016-11-07 NOTE — Progress Notes (Signed)
By signing my name below, I, Mesha Guinyard, attest that this documentation has been prepared under the direction and in the presence of Meredith Staggers, MD.  Electronically Signed: Arvilla Market, Medical Scribe. 11/07/16. 11:01 AM.  Subjective:    Patient ID: Mackenzie Everett, female    DOB: [birth year], 56 y.o.   MRN: [id number]  HPI Chief Complaint  Patient presents with  . Medication Refill    norvasc, hctz, voltaren    HPI Comments: Mackenzie Everett is a 56 y.o. female who presents to Primary Care at [facility] for medication refill. Pt is fasting.   HTN: She takes norvasc 5 mg QD, and HCTZ 12.5 mg QD. Pt is compliant with her medication and she doesn't check her bp at home. Pt's weight hasn't changed. She can't exercise due to her foot pain. Pt has tried exercising in the pool but had to discontinue due to foot pain when stepping on the bottom of the pool. Pt tries not to fry foods and eats fast food every now and them. At home she air fries, bakes, and broils. She doesn't eat first thing in the morning since it upsets her stomach, but she tries to eat around 9am daily. She eats fried chicken 1x a week, drinks sweet tea every other day and she cut back her soda intake. Denies experiencing increased chest pain, SOB, leg swelling, muscle cramps, or other negative side effects. FHx: 3 cousin who had gastric sleeve Lab Results  Component Value Date   CREATININE 0.79 10/06/2015   BP Readings from Last 3 Encounters:  11/07/16 139/84  10/03/16 (!) 141/69  07/30/16 140/83   Wt Readings from Last 3 Encounters:  11/07/16 (!) 325 lb (147.4 kg)  10/03/16 (!) 327 lb (148.3 kg)  07/30/16 (!) 321 lb 9.6 oz (145.9 kg)  Body mass index is 47.99 kg/m.  Lumbar DDD with low back pain: History of obesity. She has taken voltaren 75 mg BID, PRN, last refilled in Jan. She did have some persistent foot pain on her right and seen by Dr. Alwyn Ren in July. She had x-rays without apparent abnormality. She was  referred to a orthopedic and rx diclofenac. It appears she was initially referred to [facility] then [facility] in July.  Reports chronic left ankle swelling, feeling a pinch in her feet when she walks and cramping in the middle of her left foot. She has to step a few times for relief of her cramping. Reports injuring her ankle last year and was placed in a boot for treatment, but she still experiences ankle pain. Pt is followed by Dr. Victorino Dike. Pt will have a MRI of both feet 8/31. Pt is taking voltaren BID daily for foot swelling and left foot pain.  Health Maintenance: She is overdue for mammogram and colonoscopy. Referrals were placed.  Patient Active Problem List   Diagnosis Date Noted  . Hepatitis C 01/19/2016  . Obesity, Class III, BMI 40-49.9 (morbid obesity) (HCC) 09/04/2011  . Hyperlipidemia 09/04/2011  . Migraine variant 09/04/2011  . Asthma with allergic rhinitis 09/04/2011  . Degenerative disc disease, lumbar 09/04/2011   Past Medical History:  Diagnosis Date  . Allergy   . Arthritis    hands and knees  . Asthma   . Hyperlipidemia 09/04/2011  . Hypertension    Past Surgical History:  Procedure Laterality Date  . APPENDECTOMY    . BREAST SURGERY    . KNEE SURGERY     1994  . TUBAL LIGATION  Allergies  Allergen Reactions  . Caffeine Other (See Comments)    migraines  . Penicillins Hives   Prior to Admission medications   Medication Sig Start Date End Date Taking? Authorizing Provider  amLODipine (NORVASC) 5 MG tablet TAKE 1 TABLET(5 MG) BY MOUTH DAILY 10/12/16  Yes Shade FloodGreene, Righteous Claiborne R, MD  desloratadine (CLARINEX) 5 MG tablet TAKE 1 TABLET(5 MG) BY MOUTH DAILY 07/30/16  Yes Shade FloodGreene, Edder Bellanca R, MD  diclofenac (VOLTAREN) 75 MG EC tablet Take 1 tablet (75 mg total) by mouth 2 (two) times daily. 10/03/16  Yes Peyton NajjarHopper, David H, MD  hydrochlorothiazide (HYDRODIURIL) 12.5 MG tablet TAKE 1 TABLET(12.5 MG) BY MOUTH DAILY 10/12/16  Yes Shade FloodGreene, Rip Hawes R, MD    azithromycin (ZITHROMAX) 250 MG tablet Take 2 pills by mouth on day 1, then 1 pill by mouth per day on days 2 through 5. Patient not taking: Reported on 10/03/2016 07/30/16   Shade FloodGreene, Larico Dimock R, MD  HYDROcodone-acetaminophen (NORCO/VICODIN) 5-325 MG tablet Take 1 tablet by mouth every 6 (six) hours as needed for moderate pain. Patient not taking: Reported on 11/07/2016 11/03/15   Shade FloodGreene, Aysha Livecchi R, MD  naproxen sodium (ANAPROX DS) 550 MG tablet Take 1 tablet (550 mg total) by mouth 2 (two) times daily with a meal. Patient not taking: Reported on 11/07/2016 04/29/16   Wallis BambergMani, Mario, PA-C   Social History   Social History  . Marital status: Single    Spouse name: N/A  . Number of children: N/A  . Years of education: N/A   Occupational History  . Not on file.   Social History Main Topics  . Smoking status: Never Smoker  . Smokeless tobacco: Never Used  . Alcohol use Yes     Comment: social  . Drug use: No  . Sexual activity: Yes    Birth control/ protection: None   Other Topics Concern  . Not on file   Social History Narrative   Lives at home alone   Review of Systems  Constitutional: Negative for unexpected weight change.  Respiratory: Negative for chest tightness and shortness of breath.   Cardiovascular: Negative for chest pain and leg swelling.  Musculoskeletal: Positive for arthralgias and joint swelling. Negative for myalgias.  Neurological: Negative for dizziness and light-headedness.    Objective:  Physical Exam  Constitutional: She is oriented to person, place, and time. She appears well-developed and well-nourished.  HENT:  Head: Normocephalic and atraumatic.  Eyes: Pupils are equal, round, and reactive to light. Conjunctivae and EOM are normal.  Neck: Carotid bruit is not present.  Cardiovascular: Normal rate, regular rhythm, normal heart sounds and intact distal pulses.  Exam reveals no gallop and no friction rub.   No murmur heard. Pulmonary/Chest: Effort normal and  breath sounds normal. No respiratory distress. She has no wheezes. She has no rales.  Abdominal: Soft. She exhibits no pulsatile midline mass. There is no tenderness.  Musculoskeletal: She exhibits edema (trace nonpitting, at the ankles).  Left ankle anterior discomfort No rash No bony tenderness Right ankle non tender 5th metatarsal nontender  Neurological: She is alert and oriented to person, place, and time.  Skin: Skin is warm and dry.  Psychiatric: She has a normal mood and affect. Her behavior is normal.  Vitals reviewed.   Vitals:   11/07/16 1032  BP: 139/84  Pulse: 82  Resp: 16  Temp: 98.4 F (36.9 C)  TempSrc: Oral  SpO2: 97%  Weight: (!) 325 lb (147.4 kg)  Height: 5\' 9"  (1.753 m)  Body mass index is 47.99 kg/m. Assessment & Plan:   Mackenzie Everett is a 56 y.o. female Essential hypertension - Plan: amLODipine (NORVASC) 5 MG tablet, hydrochlorothiazide (HYDRODIURIL) 12.5 MG tablet, Comprehensive metabolic panel  -Borderline, but no changes for now. Continue Norvasc same dose, HCTZ same dose. Check labs  Screening for colon cancer - Plan: Ambulatory referral to Gastroenterology  Screening for breast cancer - Plan: MM DIGITAL SCREENING BILATERAL  Chronic foot pain, right - Plan: diclofenac (VOLTAREN) 75 MG EC tablet,  Chronic pain of left ankle - Plan: diclofenac (VOLTAREN) 75 MG EC tablet  -Agreed to temporarily refill Voltaren for ankle/foot pain. Follow-up with orthopedist. Long-term risks of NSAIDs including cardiac risks were discussed, and especially with her underlying hypertension. Understanding expressed   Screening for hyperlipidemia - Plan: Lipid panel  BMI 45.0-49.9, adult (HCC)  - Avoidance of sugar containing beverages, portion control, fast food discussed. Option of nutritionist, medical bariatric specialist, or meeting with surgeon to discuss bariatric surgical options were also given as options.  Meds ordered this encounter  Medications  . amLODipine  (NORVASC) 5 MG tablet    Sig: TAKE 1 TABLET(5 MG) BY MOUTH DAILY    Dispense:  90 tablet    Refill:  1  . diclofenac (VOLTAREN) 75 MG EC tablet    Sig: Take 1 tablet (75 mg total) by mouth 2 (two) times daily. As needed    Dispense:  60 tablet    Refill:  0  . hydrochlorothiazide (HYDRODIURIL) 12.5 MG tablet    Sig: TAKE 1 TABLET(12.5 MG) BY MOUTH DAILY    Dispense:  90 tablet    Refill:  1   Patient Instructions   No change in meds for now. Continue to try to avoid sugar containing beverages, watch portion size, and fast food. If nutritionist or medical bariatric referral needed, please let me know.  I refilled Voltaren, but discuss med options for your foot pain with orthopaedist.   Please return in 6 months for physical.      IF you received an x-ray today, you will receive an invoice from [facility]. Please contact [facility] at [phone] with questions or concerns regarding your invoice.   IF you received labwork today, you will receive an invoice from [facility]. Please contact [facility] at [phone] with questions or concerns regarding your invoice.   Our billing staff will not be able to assist you with questions regarding bills from these companies.  You will be contacted with the lab results as soon as they are available. The fastest way to get your results is to activate your My Chart account. Instructions are located on the last page of this paperwork. If you have not heard from [place] regarding the results in 2 weeks, please contact this office.       I personally performed the services described in this documentation, which was scribed in my presence. The recorded information has been reviewed and considered for accuracy and completeness, addended by me as needed, and agree with information above.  Signed,   Meredith Staggers, MD Primary Care at [facility].  11/08/16 11:45 AM

## 2016-11-07 NOTE — Patient Instructions (Signed)
No change in meds for now. Continue to try to avoid sugar containing beverages, watch portion size, and fast food. If nutritionist or medical bariatric referral needed, please let me know.  I refilled Voltaren, but discuss med options for your foot pain with orthopaedist.   Please return in 6 months for physical.      IF you received an x-ray today, you will receive an invoice from [facility]. Please contact [facility] at [phone] with questions or concerns regarding your invoice.   IF you received labwork today, you will receive an invoice from [facility]. Please contact [facility] at [phone] with questions or concerns regarding your invoice.   Our billing staff will not be able to assist you with questions regarding bills from these companies.  You will be contacted with the lab results as soon as they are available. The fastest way to get your results is to activate your My Chart account. Instructions are located on the last page of this paperwork. If you have not heard from us regarding the results in 2 weeks, please contact this office.

## 2017-01-09 ENCOUNTER — Encounter: Payer: Self-pay | Admitting: Family Medicine

## 2017-01-27 ENCOUNTER — Other Ambulatory Visit: Payer: Self-pay | Admitting: Family Medicine

## 2017-01-27 DIAGNOSIS — M79671 Pain in right foot: Principal | ICD-10-CM

## 2017-01-27 DIAGNOSIS — G8929 Other chronic pain: Secondary | ICD-10-CM

## 2017-01-27 DIAGNOSIS — M25572 Pain in left ankle and joints of left foot: Secondary | ICD-10-CM

## 2017-01-27 NOTE — Telephone Encounter (Signed)
Please advise 

## 2017-02-01 NOTE — Telephone Encounter (Signed)
Refilled, but long term risks were discussed at previous visit. Would still recommend coordinating plan with orthopedist

## 2017-02-06 NOTE — Telephone Encounter (Signed)
LMOVM with Dr. Paralee CancelGreene's message. Requested pt to return call to ensure she is seeing [facility]

## 2017-03-27 ENCOUNTER — Encounter: Payer: Self-pay | Admitting: Family Medicine

## 2017-03-27 ENCOUNTER — Ambulatory Visit: Payer: BC Managed Care – PPO | Admitting: Family Medicine

## 2017-03-27 ENCOUNTER — Other Ambulatory Visit: Payer: Self-pay

## 2017-03-27 VITALS — BP 132/90 | HR 63 | Temp 98.6°F | Resp 18 | Ht 69.0 in | Wt 332.0 lb

## 2017-03-27 DIAGNOSIS — I10 Essential (primary) hypertension: Secondary | ICD-10-CM

## 2017-03-27 DIAGNOSIS — R0981 Nasal congestion: Secondary | ICD-10-CM | POA: Diagnosis not present

## 2017-03-27 DIAGNOSIS — H6121 Impacted cerumen, right ear: Secondary | ICD-10-CM | POA: Diagnosis not present

## 2017-03-27 MED ORDER — CARBAMIDE PEROXIDE 6.5 % OT SOLN: 5.0000 [drp] | Freq: Two times a day (BID) | OTIC | 0 refills | Status: DC

## 2017-03-27 NOTE — Progress Notes (Signed)
Chief Complaint  Patient presents with  . Sinus Problem    x1 week, pt states she is having sinus pain and states it feels like a stabbing pain and has fatigue.    HPI  Starting a week ago pt was having sinus pressure that seems to be progressively worse and is feeling nauseous, stabbing pains under her eyes and nasal congestion She had an episode during the week of thanksgiving that resolved after a week of otc meds She states that she has tried otc during this episode she did sinus rinses, meds and tried decongestants Overnight she had feverish feelings and sweat to the point where her clothes were damp She has been having bad sinus headaches since 03/25/2017 She denies dizziness She has chronic tinnitus She is drinking plenty of fluids She is a nonsmoker and has a history of exercise induced sob    Past Medical History:  Diagnosis Date  . Allergy   . Arthritis    hands and knees  . Asthma   . Hyperlipidemia 09/04/2011  . Hypertension     Current Outpatient Medications  Medication Sig Dispense Refill  . amLODipine (NORVASC) 5 MG tablet TAKE 1 TABLET(5 MG) BY MOUTH DAILY 90 tablet 1  . desloratadine (CLARINEX) 5 MG tablet TAKE 1 TABLET(5 MG) BY MOUTH DAILY 90 tablet 1  . diclofenac (VOLTAREN) 75 MG EC tablet TAKE 1 TABLET(75 MG) BY MOUTH TWICE DAILY AS NEEDED 60 tablet 0  . hydrochlorothiazide (HYDRODIURIL) 12.5 MG tablet TAKE 1 TABLET(12.5 MG) BY MOUTH DAILY 90 tablet 1  . carbamide peroxide (DEBROX) 6.5 % OTIC solution Place 5 drops into the right ear 2 (two) times daily. 15 mL 0   No current facility-administered medications for this visit.     Allergies:  Allergies  Allergen Reactions  . Caffeine Other (See Comments)    migraines  . Penicillins Hives    Past Surgical History:  Procedure Laterality Date  . APPENDECTOMY    . BREAST SURGERY    . KNEE SURGERY     1994  . TUBAL LIGATION      Social History   Socioeconomic History  . Marital status: Single      Spouse name: None  . Number of children: None  . Years of education: None  . Highest education level: None  Social Needs  . Financial resource strain: None  . Food insecurity - worry: None  . Food insecurity - inability: None  . Transportation needs - medical: None  . Transportation needs - non-medical: None  Occupational History  . None  Tobacco Use  . Smoking status: Never Smoker  . Smokeless tobacco: Never Used  Substance and Sexual Activity  . Alcohol use: Yes    Comment: social  . Drug use: No  . Sexual activity: Yes    Birth control/protection: None  Other Topics Concern  . None  Social History Narrative   Lives at home alone    Family History  Problem Relation Age of Onset  . Arthritis Mother   . Diabetes Mother   . Hyperlipidemia Mother   . Hypertension Mother   . Hyperlipidemia Sister   . Hypertension Sister   . Lupus Sister   . Diabetes Maternal Grandmother   . Heart disease Maternal Grandfather   . Heart disease Paternal Grandfather   . Cancer Maternal Aunt      ROS Review of Systems See HPI Constitution: see hpi No malaise No diaphoresis Skin: No rash or itching Eyes: no  blurry vision, no double vision GU: no dysuria or hematuria Neuro: no dizziness or headaches all others reviewed and negative   Objective: Vitals:   03/27/17 1046  BP: 132/90  Pulse: 63  Resp: 18  Temp: 98.6 F (37 C)  TempSrc: Oral  SpO2: 97%  Weight: (!) 332 lb (150.6 kg)  Height: 5\' 9"  (1.753 m)    Physical Exam General: alert, oriented, in NAD Head: normocephalic, atraumatic, no sinus tenderness Eyes: EOM intact, no scleral icterus or conjunctival injection Ears: TM impacted on right, normal on left Nose: mucosa nonerythematous, nonedematous Throat: no pharyngeal exudate or erythema Lymph: no posterior auricular, submental or cervical lymph adenopathy Heart: normal rate, normal sinus rhythm, no murmurs Lungs: clear to auscultation bilaterally, no  wheezing   Assessment and Plan Mackenzie Everett was seen today for sinus problem.  Diagnoses and all orders for this visit:  Essential hypertension- bp in good range for now  Advised to avoid meds with dextromorphan  Sinus congestion- advised antihistamines  Impacted cerumen of right ear- advised debrox for ear wax impaction -     carbamide peroxide (DEBROX) 6.5 % OTIC solution; Place 5 drops into the right ear 2 (two) times daily.     Mackenzie Everett A Margarine Grosshans

## 2017-03-27 NOTE — Patient Instructions (Addendum)
We recommend that you schedule a mammogram for breast cancer screening. Typically, you do not need a referral to do this. Please contact a local imaging center to schedule your mammogram.  [facility] - [phone]  *ask for the Radiology Department [facility] - [phone] or [phone]  [facility] - [phone] [facility] - [phone] [facility] - [phone]  *ask for the Radiology Department [facility] - [phone]  *ask for the Radiology Department [facility] - [phone]  *ask for the Mammography Department [facility] - [phone]   IF you received an x-ray today, you will receive an invoice from [facility]. Please contact [facility] at [phone] with questions or concerns regarding your invoice.   IF you received labwork today, you will receive an invoice from [facility]. Please contact [facility] at [phone] with questions or concerns regarding your invoice.   Our billing staff will not be able to assist you with questions regarding bills from these companies.  You will be contacted with the lab results as soon as they are available. The fastest way to get your results is to activate your My Chart account. Instructions are located on the last page of this paperwork. If you have not heard from us regarding the results in 2 weeks, please contact this office.     Ear Drops, Adult Your doctor has found that you have a condition that requires you to use ear drops. Ear drops are a medicine that is placed in the ear. This sheet gives you information about how to use this medicine. Your doctor may also give you more specific instructions. Supplies needed:  Cotton ball.  Medicine. How to put ear drops into your ear 1. Wash your hands with soap and water. 2. Make sure your ears are clean and dry. 3. Warm the medicine by holding  it in your hand for a few minutes. 4. Shake the medicine to mix the ear drops. 5. Use the tube to get the medicine. You will need to squeeze the round part of the tube to do this. 6. Put the drops in your ear as told. Hold the tube above your ear. Do not let the tube touch your ear. The medicine may go in easier if you pull the flap of your ear up and back while you put the drops in. 7. To make sure your ear soaks up the medicine, do one of these things: ? Lie down for 10 minutes. The ear with the medicine should face up. ? Put a cotton ball in your ear. Do not push it deeper into your ear. Take out the cotton ball when the drops have been soaked up. 8. If you need to put drops in your other ear, repeat the same steps. Your doctor will tell you if you should put drops in both ears. Follow these instructions at home:  Use the ear drops for as long as your doctor tells you to. Do not stop even if your symptoms get better.  Keep the ear drops at room temperature.  Keep all follow-up visits as told by your doctor. This is important. Contact a doctor if:  Your condition gets worse.  Your pain gets worse.  Unusual fluid (drainage) is coming from your ear, especially if the fluid stinks.  You have trouble hearing. Get help right away if:  You feel like the room is spinning and you feel sick  to your stomach. This condition is called vertigo.  The outside of your ear becomes red or swollen.  You have a very bad headache. Summary  Ear drops are a medicine that is put in the ear.  Put the drops in your ear as told by your doctor.  Use the ear drops for as long as your doctor tells you to. Do not stop even if your symptoms get better. This information is not intended to replace advice given to you by your health care provider. Make sure you discuss any questions you have with your health care provider. Document Released: 09/05/2009 Document Revised: 03/22/2016 Document Reviewed:  03/22/2016 Elsevier Interactive Patient Education  2017 [employer]vier Inc. Sinus Rinse What is a sinus rinse? A sinus rinse is a simple home treatment that is used to rinse your sinuses with a sterile mixture of salt and water (saline solution). Sinuses are air-filled spaces in your skull behind the bones of your face and forehead that open into your nasal cavity. You will use the following:  Saline solution.  Neti pot or spray bottle. This releases the saline solution into your nose and through your sinuses. Neti pots and spray bottles can be purchased at [facility] local pharmacy, a health food store, or online.  When would I do a sinus rinse? A sinus rinse can help to clear mucus, dirt, dust, or pollen from the nasal cavity. You may do a sinus rinse when you have a cold, a virus, nasal allergy symptoms, a sinus infection, or stuffiness in the nose or sinuses. If you are considering a sinus rinse:  Ask your child's health care provider before performing a sinus rinse on your child.  Do not do a sinus rinse if you have had ear or nasal surgery, ear infection, or blocked ears.  How do I do a sinus rinse?  Wash your hands.  Disinfect your device according to the directions provided and then dry it.  Use the solution that comes with your device or one that is sold separately in stores. Follow the mixing directions on the package.  Fill your device with the amount of saline solution as directed by the device instructions.  Stand over a sink and tilt your head sideways over the sink.  Place the spout of the device in your upper nostril (the one closer to the ceiling).  Gently pour or squeeze the saline solution into the nasal cavity. The liquid should drain to the lower nostril if you are not overly congested.  Gently blow your nose. Blowing too hard may cause ear pain.  Repeat in the other nostril.  Clean and rinse your device with clean water and then air-dry it. Are there risks of a  sinus rinse? Sinus rinse is generally very safe and effective. However, there are a few risks, which include:  A burning sensation in the sinuses. This may happen if you do not make the saline solution as directed. Make sure to follow all directions when making the saline solution.  Infection from contaminated water. This is rare, but possible.  Nasal irritation.  This information is not intended to replace advice given to you by your health care provider. Make sure you discuss any questions you have with your health care provider. Document Released: 10/13/2013 Document Revised: 02/13/2016 Document Reviewed: 08/03/2013 Elsevier Interactive Patient Education  2017 [employer]

## 2017-04-15 ENCOUNTER — Other Ambulatory Visit: Payer: Self-pay

## 2017-04-15 ENCOUNTER — Encounter: Payer: Self-pay | Admitting: Family Medicine

## 2017-04-15 ENCOUNTER — Ambulatory Visit: Payer: BC Managed Care – PPO | Admitting: Family Medicine

## 2017-04-15 VITALS — BP 148/94 | HR 95 | Temp 98.4°F | Resp 18 | Ht 69.0 in | Wt 332.2 lb

## 2017-04-15 DIAGNOSIS — R51 Headache: Secondary | ICD-10-CM

## 2017-04-15 DIAGNOSIS — Z23 Encounter for immunization: Secondary | ICD-10-CM | POA: Diagnosis not present

## 2017-04-15 DIAGNOSIS — I10 Essential (primary) hypertension: Secondary | ICD-10-CM

## 2017-04-15 DIAGNOSIS — R519 Headache, unspecified: Secondary | ICD-10-CM

## 2017-04-15 MED ORDER — LOSARTAN POTASSIUM 50 MG PO TABS: 50.0000 mg | ORAL_TABLET | Freq: Every day | ORAL | 3 refills | Status: DC

## 2017-04-15 MED ORDER — TRAMADOL HCL 50 MG PO TABS: 50.0000 mg | ORAL_TABLET | Freq: Three times a day (TID) | ORAL | 0 refills | Status: DC | PRN

## 2017-04-15 NOTE — Patient Instructions (Addendum)
Hold the amlodipine  Begin Losartan 50 mg daily  Take tylenol 500 mg 2 pills 3 times daily if needed for headache  In addition take Aleve 2 pills twice daily if needed for worse headache  Take Tramadol 50 mg 3 times daily for persisting headache  Return if not improved over the next week with being away from the amlodipine  Recheck your BP in 1 week  Return 1 month Dr. Neva SeatGreene      IF you received an x-ray today, you will receive an invoice from [facility]. Please contact [facility] at [phone] with questions or concerns regarding your invoice.   IF you received labwork today, you will receive an invoice from [facility]. Please contact [facility] at [phone] with questions or concerns regarding your invoice.   Our billing staff will not be able to assist you with questions regarding bills from these companies.  You will be contacted with the lab results as soon as they are available. The fastest way to get your results is to activate your My Chart account. Instructions are located on the last page of this paperwork. If you have not heard from us regarding the results in 2 weeks, please contact this office.

## 2017-04-15 NOTE — Progress Notes (Signed)
Patient ID: Mackenzie DukesKaren R Everett, female    DOB: 11-13-1960  Age: 57 y.o. MRN: [id number]  Chief Complaint  Patient presents with  . Headache    x3days they keep coming and going, pt states it feels like it happens when she takes her BP meds she gets a headache     Subjective:   57 year old lady with history of blood pressure.  She has been having left-sided headaches since around Christmas.  She had been on the medicine for a couple of years.  However when she came off the amlodipine for a few days the headaches seem to be better.  She thinks the amlodipine is causing her headaches.  The headaches are primarily in the left head and left temporal region.  She says these are different from migraines that she has had in the past.  Gets a little taste in her mouth but no heartburn or reflux.  She works a regular job.  Works on a [employer].  Has tried Tylenol for the headache without relief.  Hurt her bad all last night and is hurting still this morning.  Current allergies, medications, problem list, past/family and social histories reviewed.  Objective:  BP (!) 148/94   Pulse 95   Temp 98.4 F (36.9 C) (Oral)   Resp 18   Ht 5\' 9"  (1.753 m)   Wt (!) 332 lb 3.2 oz (150.7 kg)   SpO2 100%   BMI 49.06 kg/m   Alert and oriented.  No major distress.  TMs normal.  Eyes PERRLA.  Fundi benign.  Discs normal.  Throat clear.  Neck supple without nodes.  Chest clear.  Heart regular without murmur.  Motor normal.  Assessment & Plan:   Assessment: 1. Need for influenza vaccination   2. Acute nonintractable headache, unspecified headache type   3. Essential hypertension       Plan: See instructions.  Discussed all this with her.  Orders Placed This Encounter  Procedures  . Flu Vaccine QUAD 36+ mos IM    No orders of the defined types were placed in this encounter.        Patient Instructions   Hold the amlodipine  Begin Losartan 50 mg daily  Take tylenol 500 mg 2 pills 3 times daily if  needed for headache  In addition take Aleve 2 pills twice daily if needed for worse headache  Take Tramadol 50 mg 3 times daily for persisting headache  Return if not improved over the next week with being away from the amlodipine  Recheck your BP in 1 week  Return 1 month Dr. Neva SeatGreene      IF you received an x-ray today, you will [facility] an [facility]. Please contact [facility] at [phone] with questions or concerns regarding your invoice.   IF you received labwork today, you will receive an invoice from [facility]. Please contact [facility] at [phone] with questions or concerns regarding your invoice.   Our billing staff will not be able to assist you with questions regarding bills from these companies.  You will be contacted with the lab results as soon as they are available. The fastest way to get your results is to activate your My Chart account. Instructions are located on the last page of this paperwork. If you have not heard from us regarding the results in 2 weeks, please contact this office.        Return if symptoms worsen or fail to improve.   Asha Grumbine, MD 04/15/2017

## 2017-06-30 ENCOUNTER — Ambulatory Visit: Payer: Self-pay | Admitting: *Deleted

## 2017-06-30 ENCOUNTER — Ambulatory Visit: Payer: BC Managed Care – PPO | Admitting: Family Medicine

## 2017-06-30 ENCOUNTER — Other Ambulatory Visit: Payer: Self-pay

## 2017-06-30 ENCOUNTER — Encounter: Payer: Self-pay | Admitting: Family Medicine

## 2017-06-30 VITALS — BP 130/68 | HR 98 | Temp 98.5°F | Ht 69.69 in | Wt 337.0 lb

## 2017-06-30 DIAGNOSIS — R61 Generalized hyperhidrosis: Secondary | ICD-10-CM

## 2017-06-30 DIAGNOSIS — R5383 Other fatigue: Secondary | ICD-10-CM

## 2017-06-30 DIAGNOSIS — R0609 Other forms of dyspnea: Secondary | ICD-10-CM | POA: Diagnosis not present

## 2017-06-30 DIAGNOSIS — E785 Hyperlipidemia, unspecified: Secondary | ICD-10-CM | POA: Diagnosis not present

## 2017-06-30 DIAGNOSIS — R6889 Other general symptoms and signs: Secondary | ICD-10-CM | POA: Diagnosis not present

## 2017-06-30 DIAGNOSIS — R0600 Dyspnea, unspecified: Secondary | ICD-10-CM

## 2017-06-30 DIAGNOSIS — I10 Essential (primary) hypertension: Secondary | ICD-10-CM | POA: Diagnosis not present

## 2017-06-30 DIAGNOSIS — R002 Palpitations: Secondary | ICD-10-CM

## 2017-06-30 LAB — POCT CBC
GRANULOCYTE PERCENT: 54.1 % (ref 37–80)
HCT, POC: 36.5 % — AB (ref 37.7–47.9)
Hemoglobin: 11.6 g/dL — AB (ref 12.2–16.2)
Lymph, poc: 3.4 (ref 0.6–3.4)
MCH, POC: 25 pg — AB (ref 27–31.2)
MCHC: 31.8 g/dL (ref 31.8–35.4)
MCV: 78.5 fL — AB (ref 80–97)
MID (cbc): 0.4 (ref 0–0.9)
MPV: 7 fL (ref 0–99.8)
PLATELET COUNT, POC: 287 10*3/uL (ref 142–424)
POC Granulocyte: 4.5 (ref 2–6.9)
POC LYMPH %: 40.8 % (ref 10–50)
POC MID %: 5.1 %M (ref 0–12)
RBC: 4.65 M/uL (ref 4.04–5.48)
RDW, POC: 14.6 %
WBC: 8.3 10*3/uL (ref 4.6–10.2)

## 2017-06-30 LAB — GLUCOSE, POCT (MANUAL RESULT ENTRY): POC GLUCOSE: 89 mg/dL (ref 70–99)

## 2017-06-30 NOTE — Telephone Encounter (Signed)
Pt reports mild dizziness x 2 weeks, "lightheaded." Positional, worse when "bending over." States feels "pressure in front of head" when she does so. H/O of allergies, takes Claritin daily.   States feels "heart racing at times" not presently. HR during call 88. Unable to check B/P at this time.  Mild constant headache and occasional night sweats. Reports took B/P last week; can not recall what is was but states it was "low" for her.  B/P medication changed 04/15/17. Pt also reports "bad taste in her mouth all the time." Appt made with Dr. Neva SeatGreene for today. Care advise given; instructed to call back if symptoms worsen.  Reason for Disposition . [1] MILD dizziness (e.g., walking normally) AND [2] has NOT been evaluated by physician for this  (Exception: dizziness caused by heat exposure, sudden standing, or poor fluid intake)  Answer Assessment - Initial Assessment Questions 1. DESCRIPTION: "Describe your dizziness."    Lightheaded, positional, worse leaning over 2. LIGHTHEADED: "Do you feel lightheaded?" (e.g., somewhat faint, woozy, weak upon standing)     Yes 3. VERTIGO: "Do you feel like either you or the room is spinning or tilting?" (i.e. vertigo)     No 4. SEVERITY: "How bad is it?"  "Do you feel like you are going to faint?" "Can you stand and walk?"   - MILD - walking normally   - MODERATE - interferes with normal activities (e.g., work, school)    - SEVERE - unable to stand, requires support to walk, feels like passing out now.      mild 5. ONSET:  "When did the dizziness begin?"     2 weeks  6. AGGRAVATING FACTORS: "Does anything make it worse?" (e.g., standing, change in head position)    "Leaning over." 7. HEART RATE: "Can you tell me your heart rate?" "How many beats in 15 seconds?"  (Note: not all patients can do this)       88 8. CAUSE: "What do you think is causing the dizziness?"     Unsure 9. RECURRENT SYMPTOM: "Have you had dizziness before?" If so, ask: "When was the last  time?" "What happened that time?"    No 10. OTHER SYMPTOMS: "Do you have any other symptoms?" (e.g., fever, chest pain, vomiting, diarrhea, bleeding)       Hot flashes, occasional night sweats, headache dull, constant 11. PREGNANCY: "Is there any chance you are pregnant?" "When was your last menstrual period?"       no  Protocols used: DIZZINESS [facility]- LIGHTHEADEDNESS-A-AH

## 2017-06-30 NOTE — Patient Instructions (Addendum)
Currently heart rhythm looks okay, blood sugar looks okay.  Hemoglobin or test for anemia was borderline low, but not concerning at that level.  I would like to recheck that in the next 1 week to make sure it remains stable.  I did send off some other blood work, but while we are waiting for those results, I will also refer you to cardiology to determine if other testing is needed.  Make sure you drink plenty of fluids, rest as needed, and if any acute worsening of symptoms, be seen in the emergency room.  Otherwise recheck with me in 1 week to decide on next step in workup of symptoms.  Return to the clinic or go to the nearest emergency room if any of your symptoms worsen or new symptoms occur.   Palpitations A palpitation is the feeling that your heartbeat is irregular or is faster than normal. It may feel like your heart is fluttering or skipping a beat. Palpitations are usually not a serious problem. They may be caused by many things, including smoking, caffeine, alcohol, stress, and certain medicines. Although most causes of palpitations are not serious, palpitations can be a sign of a serious medical problem. In some cases, you may need further medical evaluation. Follow these instructions at home: Pay attention to any changes in your symptoms. Take these actions to help with your condition:  Avoid the following: ? Caffeinated coffee, tea, soft drinks, diet pills, and energy drinks. ? Chocolate. ? Alcohol.  Do not use any tobacco products, such as cigarettes, chewing tobacco, and e-cigarettes. If you need help quitting, ask your health care provider.  Try to reduce your stress and anxiety. Things that can help you relax include: ? Yoga. ? Meditation. ? Physical activity, such as swimming, jogging, or walking. ? Biofeedback. This is a method that helps you learn to use your mind to control things in your body, such as your heartbeats.  Get plenty of rest and sleep.  Take  over-the-counter and prescription medicines only as told by your health care provider.  Keep all follow-up visits as told by your health care provider. This is important.  Contact a health care provider if:  You continue to have a fast or irregular heartbeat after 24 hours.  Your palpitations occur more often. Get help right away if:  You have chest pain or shortness of breath.  You have a severe headache.  You feel dizzy or you faint. This information is not intended to replace advice given to you by your health care provider. Make sure you discuss any questions you have with your health care provider. Document Released: 03/15/2000 Document Revised: 08/21/2015 Document Reviewed: 12/01/2014 Elsevier Interactive Patient Education  2018 [employer]  Fatigue Fatigue is feeling tired all of the time, a lack of energy, or a lack of motivation. Occasional or mild fatigue is often a normal response to activity or life in general. However, long-lasting (chronic) or extreme fatigue may indicate an underlying medical condition. Follow these instructions at home: Watch your fatigue for any changes. The following actions may help to lessen any discomfort you are feeling:  Talk to your health care provider about how much sleep you need each night. Try to get the required amount every night.  Take medicines only as directed by your health care provider.  Eat a healthy and nutritious diet. Ask your health care provider if you need help changing your diet.  Drink enough fluid to keep your urine clear or pale  yellow.  Practice ways of relaxing, such as yoga, meditation, massage therapy, or acupuncture.  Exercise regularly.  Change situations that cause you stress. Try to keep your work and personal routine reasonable.  Do not abuse illegal drugs.  Limit alcohol intake to no more than 1 drink per day for nonpregnant women and 2 drinks per day for men. One drink equals 12 ounces of beer, 5  ounces of wine, or 1 ounces of hard liquor.  Take a multivitamin, if directed by your health care provider.  Contact a health care provider if:  Your fatigue does not get better.  You have a fever.  You have unintentional weight loss or gain.  You have headaches.  You have difficulty: ? Falling asleep. ? Sleeping throughout the night.  You feel angry, guilty, anxious, or sad.  You are unable to have a bowel movement (constipation).  You skin is dry.  Your legs or another part of your body is swollen. Get help right away if:  You feel confused.  Your vision is blurry.  You feel faint or pass out.  You have a severe headache.  You have severe abdominal, pelvic, or back pain.  You have chest pain, shortness of breath, or an irregular or fast heartbeat.  You are unable to urinate or you urinate less than normal.  You develop abnormal bleeding, such as bleeding from the rectum, vagina, nose, lungs, or nipples.  You vomit blood.  You have thoughts about harming yourself or committing suicide.  You are worried that you might harm someone else. This information is not intended to replace advice given to you by your health care provider. Make sure you discuss any questions you have with your health care provider. Document Released: 01/13/2007 Document Revised: 08/24/2015 Document Reviewed: 07/20/2013 Elsevier Interactive Patient Education  2018 [employer].   IF you received an x-ray today, you will receive an invoice from [facility]. Please contact [facility] at [phone] with questions or concerns regarding your invoice.   IF you received labwork today, you will receive an invoice from [facility]. Please contact [facility] at [phone] with questions or concerns regarding your invoice.   Our billing staff will not be able to assist you with questions regarding bills from these companies.  You will be contacted with the lab results as soon  as they are available. The fastest way to get your results is to activate your My Chart account. Instructions are located on the last page of this paperwork. If you have not heard from [place] regarding the results in 2 weeks, please contact this office.

## 2017-06-30 NOTE — Progress Notes (Signed)
Subjective:    Patient ID: Mackenzie Everett, female    DOB: 1961/03/04, 57 y.o.   MRN: [id number]  HPI Mackenzie Everett is a 57 y.o. female Presents today for: Chief Complaint  Patient presents with  . Light headedness    nasty taste in mouth, sense of da ja vu/ or floating, Headaches in the front of head. x1.5 weeks (spells)  . Night Sweats    x4 nights  . Fatigue    1.5 weeks every day   History of hypertension, hyperlipidemia, with complaints as above.  Initially noted fatigue and HA about  1-1/2 weeks ago. Was walking at the mall about 9 days ago, felt palpitations, felt lightheaded.  Slowed down - able to drive home, but at home BP was low - unknown number, but about 90/65. Ever since then having a general sense of fatigue. No focal weakness, no facial droop, no slurred speech. Few days ago - woke up with night sweats - upper chest only. No chest pain, no fever.  Felt some more dyspnea with activity (walking), not dyspneic at rest. No known personal hx of heart disease. Mom with Afib.   Occasional bad taste in mouth, metallic taste. Denies seizures/convulsions. mentation normal.  Decreased appetite. Denies anxiety/depression, able to work ok, usual activities. Felt more fatigued than usual today with activity. Improves with rest in 5-10 mins. New med for BP in January (losartan for HA's). HLD on 10/2016 labs - plan for diet change with repeat testing in 3-6 months.  Chronically cold natured.   Lab Results  Component Value Date   TSH 1.668 04/27/2014    Patient Active Problem List   Diagnosis Date Noted  . Hepatitis C 01/19/2016  . Obesity, Class III, BMI 40-49.9 (morbid obesity) (HCC) 09/04/2011  . Hyperlipidemia 09/04/2011  . Migraine variant 09/04/2011  . Asthma with allergic rhinitis 09/04/2011  . Degenerative disc disease, lumbar 09/04/2011   Past Medical History:  Diagnosis Date  . Allergy   . Arthritis    hands and knees  . Asthma   . Hyperlipidemia 09/04/2011  .  Hypertension    Past Surgical History:  Procedure Laterality Date  . APPENDECTOMY    . BREAST SURGERY    . KNEE SURGERY     1994  . TUBAL LIGATION     Allergies  Allergen Reactions  . Caffeine Other (See Comments)    migraines  . Penicillins Hives   Prior to Admission medications   Medication Sig Start Date End Date Taking? Authorizing Provider  desloratadine (CLARINEX) 5 MG tablet TAKE 1 TABLET(5 MG) BY MOUTH DAILY 07/30/16  Yes Shade Flood, MD  hydrochlorothiazide (HYDRODIURIL) 12.5 MG tablet TAKE 1 TABLET(12.5 MG) BY MOUTH DAILY 11/07/16  Yes Shade Flood, MD  losartan (COZAAR) 50 MG tablet Take 1 tablet (50 mg total) by mouth daily. 04/15/17  Yes Peyton Najjar, MD  traMADol (ULTRAM) 50 MG tablet Take 1 tablet (50 mg total) by mouth every 8 (eight) hours as needed. 04/15/17  Yes Peyton Najjar, MD   Social History   Socioeconomic History  . Marital status: Single    Spouse name: Not on file  . Number of children: Not on file  . Years of education: Not on file  . Highest education level: Not on file  Occupational History  . Not on file  Social Needs  . Financial resource strain: Not on file  . Food insecurity:    Worry: Not on file  Inability: Not on file  . Transportation needs:    Medical: Not on file    Non-medical: Not on file  Tobacco Use  . Smoking status: Never Smoker  . Smokeless tobacco: Never Used  Substance and Sexual Activity  . Alcohol use: Yes    Comment: social  . Drug use: No  . Sexual activity: Yes    Birth control/protection: None  Lifestyle  . Physical activity:    Days per week: Not on file    Minutes per session: Not on file  . Stress: Not on file  Relationships  . Social connections:    Talks on phone: Not on file    Gets together: Not on file    Attends religious service: Not on file    Active member of club or organization: Not on file    Attends meetings of clubs or organizations: Not on file    Relationship status:  Not on file  . Intimate partner violence:    Fear of current or ex partner: Not on file    Emotionally abused: Not on file    Physically abused: Not on file    Forced sexual activity: Not on file  Other Topics Concern  . Not on file  Social History Narrative   Lives at home alone    Review of Systems As in HPI.     Objective:   Physical Exam  Constitutional: She is oriented to person, place, and time. She appears well-developed and well-nourished.  HENT:  Head: Normocephalic and atraumatic.  Eyes: Pupils are equal, round, and reactive to light. Conjunctivae and EOM are normal.  Neck: Carotid bruit is not present.  Cardiovascular: Normal rate, regular rhythm, normal heart sounds and intact distal pulses.  Pulmonary/Chest: Effort normal and breath sounds normal.  Abdominal: Soft. She exhibits no pulsatile midline mass. There is no tenderness.  Neurological: She is alert and oriented to person, place, and time. No cranial nerve deficit. Coordination and gait normal.  No pronator drift. Equal strength UE/LE/face, no droop.  Normal finger-nose, normal heel to toe.  Skin: Skin is warm and dry.  Psychiatric: She has a normal mood and affect. Her behavior is normal.  Vitals reviewed.  Vitals:   06/30/17 1527  BP: 130/68  Pulse: 98  Temp: 98.5 F (36.9 C)  TempSrc: Oral  SpO2: 98%  Weight: (!) 337 lb (152.9 kg)  Height: 5' 9.69" (1.77 m)  EKG: Sinus rhythm, rate 68, PR 150, QT 366,  nonspecific T wave in lead III. no apparent acute findings Orthostatic blood pressure/pulse reviewed -slight elevation in blood pressure with standing.  Heart rate 90-83.  Results for orders placed or performed in visit on 06/30/17  POCT CBC  Result Value Ref Range   WBC 8.3 4.6 - 10.2 K/uL   Lymph, poc 3.4 0.6 - 3.4   POC LYMPH PERCENT 40.8 10 - 50 %L   MID (cbc) 0.4 0 - 0.9   POC MID % 5.1 0 - 12 %M   POC Granulocyte 4.5 2 - 6.9   Granulocyte percent 54.1 37 - 80 %G   RBC 4.65 4.04 - 5.48  M/uL   Hemoglobin 11.6 (A) 12.2 - 16.2 g/dL   HCT, POC 16.136.5 (A) 09.637.7 - 47.9 %   MCV 78.5 (A) 80 - 97 fL   MCH, POC 25.0 (A) 27 - 31.2 pg   MCHC 31.8 31.8 - 35.4 g/dL   RDW, POC 04.514.6 %   Platelet Count, POC 287 142 -  424 K/uL   MPV 7.0 0 - 99.8 fL  POCT glucose (manual entry)  Result Value Ref Range   POC Glucose 89 70 - 99 mg/dl     Assessment & Plan:   Mackenzie Everett is a 57 y.o. female Fatigue, unspecified type - Plan: POCT CBC, EKG 12-Lead, Orthostatic vital signs, Ambulatory referral to Cardiology  Heart palpitations - Plan: POCT CBC, POCT glucose (manual entry), EKG 12-Lead, TSH, Comprehensive metabolic panel, Ambulatory referral to Cardiology  Night sweat  DOE (dyspnea on exertion) - Plan: Ambulatory referral to Cardiology  Essential hypertension - Plan: EKG 12-Lead  Hyperlipidemia, unspecified hyperlipidemia type  Cold intolerance - Plan: TSH  Fatigue with episodic palpitations, reported night sweats, dyspnea on exertion as above and cold intolerance.  May be multifactorial.  Reassuring orthostatics, borderline low hemoglobin, but unlikely cause of symptoms.  EKG without acute findings.  Glucose normal, vital signs normal.  -Check TSH to screen for thyroid disease, CMP to screen for electrolyte cause  -Maintain hydration, regular meals  -Refer to cardiology for further evaluation of the fatigue, dyspnea exertion, heart palpitations, but ER precautions discussed if acute worsening  -Recheck in 1 week, likely repeat CBC to verify stability at that time.  No orders of the defined types were placed in this encounter.  Patient Instructions   Currently heart rhythm looks okay, blood sugar looks okay.  Hemoglobin or test for anemia was borderline low, but not concerning at that level.  I would like to recheck that in the next 1 week to make sure it remains stable.  I did send off some other blood work, but while we are waiting for those results, I will also refer you to  cardiology to determine if other testing is needed.  Make sure you drink plenty of fluids, rest as needed, and if any acute worsening of symptoms, be seen in the emergency room.  Otherwise recheck with me in 1 week to decide on next step in workup of symptoms.  Return to the clinic or go to the nearest emergency room if any of your symptoms worsen or new symptoms occur.   Palpitations A palpitation is the feeling that your heartbeat is irregular or is faster than normal. It may feel like your heart is fluttering or skipping a beat. Palpitations are usually not a serious problem. They may be caused by many things, including smoking, caffeine, alcohol, stress, and certain medicines. Although most causes of palpitations are not serious, palpitations can be a sign of a serious medical problem. In some cases, you may need further medical evaluation. Follow these instructions at home: Pay attention to any changes in your symptoms. Take these actions to help with your condition:  Avoid the following: ? Caffeinated coffee, tea, soft drinks, diet pills, and energy drinks. ? Chocolate. ? Alcohol.  Do not use any tobacco products, such as cigarettes, chewing tobacco, and e-cigarettes. If you need help quitting, ask your health care provider.  Try to reduce your stress and anxiety. Things that can help you relax include: ? Yoga. ? Meditation. ? Physical activity, such as swimming, jogging, or walking. ? Biofeedback. This is a method that helps you learn to use your mind to control things in your body, such as your heartbeats.  Get plenty of rest and sleep.  Take over-the-counter and prescription medicines only as told by your health care provider.  Keep all follow-up visits as told by your health care provider. This is important.  Contact a health  care provider if:  You continue to have a fast or irregular heartbeat after 24 hours.  Your palpitations occur more often. Get help right away  if:  You have chest pain or shortness of breath.  You have a severe headache.  You feel dizzy or you faint. This information is not intended to replace advice given to you by your health care provider. Make sure you discuss any questions you have with your health care provider. Document Released: 03/15/2000 Document Revised: 08/21/2015 Document Reviewed: 12/01/2014 Elsevier Interactive Patient Education  2018 [employer].  Fatigue Fatigue is feeling tired all of the time, a lack of energy, or a lack of motivation. Occasional or mild fatigue is often a normal response to activity or life in general. However, long-lasting (chronic) or extreme fatigue may indicate an underlying medical condition. Follow these instructions at home: Watch your fatigue for any changes. The following actions may help to lessen any discomfort you are feeling:  Talk to your health care provider about how much sleep you need each night. Try to get the required amount every night.  Take medicines only as directed by your health care provider.  Eat a healthy and nutritious diet. Ask your health care provider if you need help changing your diet.  Drink enough fluid to keep your urine clear or pale yellow.  Practice ways of relaxing, such as yoga, meditation, massage therapy, or acupuncture.  Exercise regularly.  Change situations that cause you stress. Try to keep your work and personal routine reasonable.  Do not abuse illegal drugs.  Limit alcohol intake to no more than 1 drink per day for nonpregnant women and 2 drinks per day for men. One drink equals 12 ounces of beer, 5 ounces of wine, or 1 ounces of hard liquor.  Take a multivitamin, if directed by your health care provider.  Contact a health care provider if:  Your fatigue does not get better.  You have a fever.  You have unintentional weight loss or gain.  You have headaches.  You have difficulty: ? Falling asleep. ? Sleeping throughout  the night.  You feel angry, guilty, anxious, or sad.  You are unable to have a bowel movement (constipation).  You skin is dry.  Your legs or another part of your body is swollen. Get help right away if:  You feel confused.  Your vision is blurry.  You feel faint or pass out.  You have a severe headache.  You have severe abdominal, pelvic, or back pain.  You have chest pain, shortness of breath, or an irregular or fast heartbeat.  You are unable to urinate or you urinate less than normal.  You develop abnormal bleeding, such as bleeding from the rectum, vagina, nose, lungs, or nipples.  You vomit blood.  You have thoughts about harming yourself or committing suicide.  You are worried that you might harm someone else. This information is not intended to replace advice given to you by your health care provider. Make sure you discuss any questions you have with your health care provider. Document Released: 01/13/2007 Document Revised: 08/24/2015 Document Reviewed: 07/20/2013 Elsevier Interactive Patient Education  2018 [employer].   IF you received an x-ray today, you will receive an invoice from [facility]. Please contact [facility] at [phone] with questions or concerns regarding your invoice.   IF you received labwork today, you will receive an invoice from [facility]. Please contact [facility] at [phone] with questions or concerns regarding your invoice.  Our billing staff will not be able to assist you with questions regarding bills from these companies.  You will be contacted with the lab results as soon as they are available. The fastest way to get your results is to activate your My Chart account. Instructions are located on the last page of this paperwork. If you have not heard from [place] regarding the results in 2 weeks, please contact this office.       Signed,   Meredith Staggers, MD Primary Care at [facility]: [facility].   07/02/17 2:09 PM

## 2017-07-01 LAB — COMPREHENSIVE METABOLIC PANEL
ALT: 10 IU/L (ref 0–32)
AST: 14 IU/L (ref 0–40)
Albumin/Globulin Ratio: 1.8 (ref 1.2–2.2)
Albumin: 4.6 g/dL (ref 3.5–5.5)
Alkaline Phosphatase: 92 IU/L (ref 39–117)
BUN/Creatinine Ratio: 17 (ref 9–23)
BUN: 14 mg/dL (ref 6–24)
Bilirubin Total: <0.2 mg/dL (ref 0.0–1.2)
CALCIUM: 9.6 mg/dL (ref 8.7–10.2)
CO2: 20 mmol/L (ref 20–29)
CREATININE: 0.83 mg/dL (ref 0.57–1.00)
Chloride: 103 mmol/L (ref 96–106)
GFR, EST AFRICAN AMERICAN: 90 mL/min/{1.73_m2} (ref >59)
GFR, EST NON AFRICAN AMERICAN: 78 mL/min/{1.73_m2} (ref >59)
GLOBULIN, TOTAL: 2.6 g/dL (ref 1.5–4.5)
Glucose: 85 mg/dL (ref 65–99)
Potassium: 4.5 mmol/L (ref 3.5–5.2)
SODIUM: 143 mmol/L (ref 134–144)
TOTAL PROTEIN: 7.2 g/dL (ref 6.0–8.5)

## 2017-07-01 LAB — TSH: TSH: 0.749 u[IU]/mL (ref 0.450–4.500)

## 2017-07-07 ENCOUNTER — Ambulatory Visit (INDEPENDENT_AMBULATORY_CARE_PROVIDER_SITE_OTHER): Payer: BC Managed Care – PPO | Admitting: Family Medicine

## 2017-07-07 ENCOUNTER — Other Ambulatory Visit: Payer: Self-pay

## 2017-07-07 ENCOUNTER — Encounter: Payer: Self-pay | Admitting: Family Medicine

## 2017-07-07 VITALS — BP 138/76 | HR 83 | Temp 98.0°F | Ht 69.0 in | Wt 334.6 lb

## 2017-07-07 DIAGNOSIS — I10 Essential (primary) hypertension: Secondary | ICD-10-CM

## 2017-07-07 DIAGNOSIS — R5383 Other fatigue: Secondary | ICD-10-CM | POA: Diagnosis not present

## 2017-07-07 DIAGNOSIS — D649 Anemia, unspecified: Secondary | ICD-10-CM | POA: Diagnosis not present

## 2017-07-07 DIAGNOSIS — J309 Allergic rhinitis, unspecified: Secondary | ICD-10-CM | POA: Diagnosis not present

## 2017-07-07 MED ORDER — HYDROCHLOROTHIAZIDE 12.5 MG PO TABS: ORAL_TABLET | ORAL | 1 refills | Status: DC

## 2017-07-07 MED ORDER — DESLORATADINE 5 MG PO TABS: ORAL_TABLET | ORAL | 1 refills | Status: DC

## 2017-07-07 MED ORDER — LOSARTAN POTASSIUM 50 MG PO TABS: 50.0000 mg | ORAL_TABLET | Freq: Every day | ORAL | 1 refills | Status: DC

## 2017-07-07 NOTE — Progress Notes (Signed)
Subjective:  By signing my name below, I, Essence Howell, attest that this documentation has been prepared under the direction and in the presence of Shade Flood, MD Electronically Signed: Charline Bills, ED Scribe 07/07/2017 at 5:43 PM.   Patient ID: Mackenzie Everett, female    DOB: [birth year], 59 y.o.   MRN: [id number]  Chief Complaint  Patient presents with  . Heart Symptoms    doing better. 1x week recheck up (Anemia, Hemoglobin, Heart rhythm & blood sugar)  . Medication Refill    Clarinex and Hydrodiuril   HPI Mackenzie Everett is a 57 y.o. female who presents to Primary Care at [facility] for f/u. Multiple concerns. See last visit 4/1.  Fatigue Borderline anemia. Reassuring glucose and EKG. Referred to cardiology. Recommended fluid intake for prior heart palpitations. Also endorsed cold intolerance and night sweats. Normal TSH, CMP, WBC. - Pt states fatigue, night sweats, cold intolerance, heart palpitations, lightheadedness, dizziness have all resolved. She did see cardiology, was placed on a heart monitor and scheduled for a stress test, echo, sleep study by next month. Also started on a low glycemic index diet. States she typically cooks her own food throughout the week and only goes out the week on Sundays.  Anemia Hgb 13.0-11.6 form last yr to last visit. Planned for recheck today.  HTN HCTZ 12.5 qd and Losartan 50 mg qd. She does not check her BP outside of the office. Denies side-effects. BP Readings from Last 3 Encounters:  07/07/17 138/76  06/30/17 130/68  04/15/17 (!) 148/94   Lab Results  Component Value Date   CREATININE 0.83 06/30/2017   Allergic Rhinitis Requests refill of Clarinex. Reports Clarinex has been working well for her. Zyrtec and Benadryl makes her drowsy.  Patient Active Problem List   Diagnosis Date Noted  . Hepatitis C 01/19/2016  . Obesity, Class III, BMI 40-49.9 (morbid obesity) (HCC) 09/04/2011  . Hyperlipidemia 09/04/2011  . Migraine variant  09/04/2011  . Asthma with allergic rhinitis 09/04/2011  . Degenerative disc disease, lumbar 09/04/2011   Past Medical History:  Diagnosis Date  . Allergy   . Arthritis    hands and knees  . Asthma   . Hyperlipidemia 09/04/2011  . Hypertension    Past Surgical History:  Procedure Laterality Date  . APPENDECTOMY    . BREAST SURGERY    . KNEE SURGERY     19 94  . TUBAL LIGATION     Allergies  Allergen Reactions  . Caffeine Other (See Comments)    migraines  . Penicillins Hives   Prior to Admission medications   Medication Sig Start Date End Date Taking? Authorizing Provider  desloratadine (CLARINEX) 5 MG tablet TAKE 1 TABLET(5 MG) BY MOUTH DAILY 07/30/16   Shade Flood, MD  hydrochlorothiazide (HYDRODIURIL) 12.5 MG tablet TAKE 1 TABLET(12.5 MG) BY MOUTH DAILY 11/07/16   Shade Flood, MD  losartan (COZAAR) 50 MG tablet Take 1 tablet (50 mg total) by mouth daily. 04/15/17   Peyton Najjar, MD  traMADol (ULTRAM) 50 MG tablet Take 1 tablet (50 mg total) by mouth every 8 (eight) hours as needed. 04/15/17   Peyton Najjar, MD   Social History   Socioeconomic History  . Marital status: Single    Spouse name: Not on file  . Number of children: Not on file  . Years of education: Not on file  . Highest education level: Not on file  Occupational History  . Not on file  Social  Needs  . Financial resource strain: Not on file  . Food insecurity:    Worry: Not on file    Inability: Not on file  . Transportation needs:    Medical: Not on file    Non-medical: Not on file  Tobacco Use  . Smoking status: Never Smoker  . Smokeless tobacco: Never Used  Substance and Sexual Activity  . Alcohol use: Yes    Comment: social  . Drug use: No  . Sexual activity: Yes    Birth control/protection: None  Lifestyle  . Physical activity:    Days per week: Not on file    Minutes per session: Not on file  . Stress: Not on file  Relationships  . Social connections:    Talks on phone:  Not on file    Gets together: Not on file    Attends religious service: Not on file    Active member of club or organization: Not on file    Attends meetings of clubs or organizations: Not on file    Relationship status: Not on file  . Intimate partner violence:    Fear of current or ex partner: Not on file    Emotionally abused: Not on file    Physically abused: Not on file    Forced sexual activity: Not on file  Other Topics Concern  . Not on file  Social History Narrative   Lives at home alone   Review of Systems  Constitutional: Negative for diaphoresis, fatigue and unexpected weight change.  Respiratory: Negative for chest tightness and shortness of breath.   Cardiovascular: Negative for chest pain, palpitations and leg swelling.  Gastrointestinal: Negative for abdominal pain and blood in stool.  Endocrine: Negative for cold intolerance.  Neurological: Negative for dizziness, syncope, light-headedness and headaches.      Objective:   Physical Exam  Constitutional: She is oriented to person, place, and time. She appears well-developed and well-nourished.  HENT:  Head: Normocephalic and atraumatic.  Eyes: Pupils are equal, round, and reactive to light. Conjunctivae and EOM are normal.  Neck: Carotid bruit is not present.  Cardiovascular: Normal rate, regular rhythm, normal heart sounds and intact distal pulses.  Pulmonary/Chest: Effort normal and breath sounds normal.  Abdominal: Soft. She exhibits no pulsatile midline mass. There is no tenderness.  Musculoskeletal: She exhibits edema (1+ pedal).  Neurological: She is alert and oriented to person, place, and time.  Skin: Skin is warm and dry.  Psychiatric: She has a normal mood and affect. Her behavior is normal.  Vitals reviewed.  Vitals:   07/07/17 1728  BP: 138/76  Pulse: 83  Temp: 98 F (36.7 C)  TempSrc: Oral  SpO2: 98%  Weight: (!) 334 lb 9.6 oz (151.8 kg)  Height: 5\' 9"  (1.753 m)      Assessment & Plan:     Mackenzie Everett is a 57 y.o. female Allergic rhinitis, unspecified seasonality, unspecified trigger - Plan: desloratadine (CLARINEX) 5 MG tablet  -Stable on Clarinex, will continue same dose.  If cost prohibitive, could also try Claritin  Essential hypertension - Plan: losartan (COZAAR) 50 MG tablet, hydrochlorothiazide (HYDRODIURIL) 12.5 MG tablet  -Stable, continue same dose of HCTZ and losartan.  Continue workup by cardiology for prior fatigue symptoms but those have all improved.  RTC precautions if recurrence of symptoms or worsening.    Anemia, unspecified type - Plan: CBC Fatigue, unspecified type - Plan: CBC  -As above has had improvement in symptoms from last visit.  Still would progress with cardiology workup of symptoms as above, commended on diet changes.  We will repeat CBC as some change recently compared to last year.  RTC precautions if new symptoms  Meds ordered this encounter  Medications  . losartan (COZAAR) 50 MG tablet    Sig: Take 1 tablet (50 mg total) by mouth daily.    Dispense:  90 tablet    Refill:  1  . hydrochlorothiazide (HYDRODIURIL) 12.5 MG tablet    Sig: TAKE 1 TABLET(12.5 MG) BY MOUTH DAILY    Dispense:  90 tablet    Refill:  1  . desloratadine (CLARINEX) 5 MG tablet    Sig: TAKE 1 TABLET(5 MG) BY MOUTH DAILY    Dispense:  90 tablet    Refill:  1   Patient Instructions   I am glad to hear you are feeling better.  I will recheck her blood count today to make sure that is stable.  Continue follow-up for testing recommended by cardiology and keep up the good work with the diet changes.  No change in blood pressure medication for now, continue same dose of allergy medication.  Recheck in 6 months.  I am happy to see you sooner if needed.  Thank you for coming in today.  IF you received an x-ray today, you will receive an invoice from [facility]. Please contact [facility] at [phone] with questions or concerns regarding your  invoice.   IF you received labwork today, you will receive an invoice from [place]. Please contact [facility] at [phone] with questions or concerns regarding your invoice.   Our billing staff will not be able to assist you with questions regarding bills from these companies.  You will be contacted with the lab results as soon as they are available. The fastest way to get your results is to activate your My Chart account. Instructions are located on the last page of this paperwork. If you have not heard from [place] regarding the results in 2 weeks, please contact this office.       I personally performed the services described in this documentation, which was scribed in my presence. The recorded information has been reviewed and considered for accuracy and completeness, addended by me as needed, and agree with information above.  Signed,   Meredith Staggers, MD Primary Care at [facility].  07/09/17 4:11 PM

## 2017-07-07 NOTE — Patient Instructions (Addendum)
I am glad to hear you are feeling better.  I will recheck her blood count today to make sure that is stable.  Continue follow-up for testing recommended by cardiology and keep up the good work with the diet changes.  No change in blood pressure medication for now, continue same dose of allergy medication.  Recheck in 6 months.  I am happy to see you sooner if needed.  Thank you for coming in today.  IF you received an x-ray today, you will receive an invoice from [facility]. Please contact [facility] at [phone] with questions or concerns regarding your invoice.   IF you received labwork today, you will receive an invoice from [facility]. Please contact [facility] at [phone] with questions or concerns regarding your invoice.   Our billing staff will not be able to assist you with questions regarding bills from these companies.  You will be contacted with the lab results as soon as they are available. The fastest way to get your results is to activate your My Chart account. Instructions are located on the last page of this paperwork. If you have not heard from us regarding the results in 2 weeks, please contact this office.

## 2017-07-08 LAB — CBC
HEMOGLOBIN: 11.8 g/dL (ref 11.1–15.9)
Hematocrit: 36.5 % (ref 34.0–46.6)
MCH: 25.2 pg — ABNORMAL LOW (ref 26.6–33.0)
MCHC: 32.3 g/dL (ref 31.5–35.7)
MCV: 78 fL — AB (ref 79–97)
Platelets: 288 10*3/uL (ref 150–379)
RBC: 4.68 x10E6/uL (ref 3.77–5.28)
RDW: 15.4 % (ref 12.3–15.4)
WBC: 8.4 10*3/uL (ref 3.4–10.8)

## 2017-07-29 ENCOUNTER — Encounter: Payer: Self-pay | Admitting: Family Medicine

## 2017-09-02 ENCOUNTER — Institutional Professional Consult (permissible substitution): Payer: BC Managed Care – PPO | Admitting: Neurology

## 2017-09-17 ENCOUNTER — Ambulatory Visit: Payer: BC Managed Care – PPO | Admitting: Neurology

## 2017-09-17 ENCOUNTER — Encounter: Payer: Self-pay | Admitting: Neurology

## 2017-09-17 VITALS — BP 146/79 | HR 64 | Ht 69.0 in | Wt 333.0 lb

## 2017-09-17 DIAGNOSIS — R351 Nocturia: Secondary | ICD-10-CM

## 2017-09-17 DIAGNOSIS — Z6841 Body Mass Index (BMI) 40.0 and over, adult: Secondary | ICD-10-CM | POA: Diagnosis not present

## 2017-09-17 DIAGNOSIS — R002 Palpitations: Secondary | ICD-10-CM

## 2017-09-17 DIAGNOSIS — R0683 Snoring: Secondary | ICD-10-CM | POA: Diagnosis not present

## 2017-09-17 DIAGNOSIS — R51 Headache: Secondary | ICD-10-CM | POA: Diagnosis not present

## 2017-09-17 DIAGNOSIS — Z820 Family history of epilepsy and other diseases of the nervous system: Secondary | ICD-10-CM

## 2017-09-17 DIAGNOSIS — R519 Headache, unspecified: Secondary | ICD-10-CM

## 2017-09-17 NOTE — Patient Instructions (Signed)

## 2017-09-17 NOTE — Progress Notes (Signed)
Subjective:    Patient ID: Mackenzie Everett is a 57 y.o. female.  HPI     Huston Foley, MD, PhD [facility] [address] [address] [place], [place] [address]  Dear Dr. Rosemary Holms,   I saw your patient, Mackenzie Everett, upon your kind request in my neurologic clinic today for initial consultation of her sleep disorder, in particular, concern for underlying obstructive sleep apnea. The patient is unaccompanied today. As you know, Ms. [name] is a 57 year old right-handed woman with an underlying medical history of hepatitis C, hyperlipidemia, palpitations, allergic rhinitis, asthma, degenerative disc disease, and morbid obesity with a BMI of over 45, who reports snoring and sleep disruption as well as palpitations, which started in March. I reviewed your office note from 07/06/2017, which you kindly included. Her Epworth sleepiness score is 5 out of 24, fatigue score is 30 out of 63. She is an [employer] school, as a Neurosurgeon. She lives with her sister, she has 4 kids. She is a nonsmoker and drinks alcohol occasionally, caffeine occasionally or maybe in the form of sweet tea once daily. She has a FHx of OSA in her mother. During the school year, she has be up at 5:45 to 6 AM. She goes to bed between 11 to 11:30, during the summer she works half days or part time and typically gets up around 7. She has nocturia about once or twice per average night, she has had occasional morning headaches and prior history of migraines. Her mother also has a diagnosis of A. fib. Patient had workup with an echocardiogram and 30 day heart monitor. She is a restless sleeper, denies telltale symptoms of restless leg syndrome. She has a TV in her bedroom. She does not have any pets. Her kids are grown. She lives with her twin sister.  Her Past Medical History Is Significant For: Past Medical History:  Diagnosis Date  . Allergy   . Arthritis    hands and knees  . Asthma   . Hyperlipidemia  09/04/2011  . Hypertension     Her Past Surgical History Is Significant For: Past Surgical History:  Procedure Laterality Date  . APPENDECTOMY    . BREAST SURGERY    . KNEE SURGERY     1994  . TUBAL LIGATION      Her Family History Is Significant For: Family History  Problem Relation Age of Onset  . Arthritis Mother   . Diabetes Mother   . Hyperlipidemia Mother   . Hypertension Mother   . Hyperlipidemia Sister   . Hypertension Sister   . Lupus Sister   . Diabetes Maternal Grandmother   . Heart disease Maternal Grandfather   . Heart disease Paternal Grandfather   . Cancer Maternal Aunt     Her Social History Is Significant For: Social History   Socioeconomic History  . Marital status: Single    Spouse name: Not on file  . Number of children: Not on file  . Years of education: Not on file  . Highest education level: Not on file  Occupational History  . Not on file  Social Needs  . Financial resource strain: Not on file  . Food insecurity:    Worry: Not on file    Inability: Not on file  . Transportation needs:    Medical: Not on file    Non-medical: Not on file  Tobacco Use  . Smoking status: Never Smoker  . Smokeless tobacco: Never Used  Substance  and Sexual Activity  . Alcohol use: Yes    Comment: social  . Drug use: No  . Sexual activity: Yes    Birth control/protection: None  Lifestyle  . Physical activity:    Days per week: Not on file    Minutes per session: Not on file  . Stress: Not on file  Relationships  . Social connections:    Talks on phone: Not on file    Gets together: Not on file    Attends religious service: Not on file    Active member of club or organization: Not on file    Attends meetings of clubs or organizations: Not on file    Relationship status: Not on file  Other Topics Concern  . Not on file  Social History Narrative   Lives at home alone    Her Allergies Are:  Allergies  Allergen Reactions  . Caffeine Other (See  Comments)    migraines  . Penicillins Hives  :   Her Current Medications Are:  Outpatient Encounter Medications as of 09/17/2017  Medication Sig  . desloratadine (CLARINEX) 5 MG tablet TAKE 1 TABLET(5 MG) BY MOUTH DAILY  . hydrochlorothiazide (HYDRODIURIL) 12.5 MG tablet TAKE 1 TABLET(12.5 MG) BY MOUTH DAILY  . losartan (COZAAR) 50 MG tablet Take 1 tablet (50 mg total) by mouth daily.  . metoprolol succinate (TOPROL-XL) 25 MG 24 hr tablet Take 25 mg by mouth daily.  . traMADol (ULTRAM) 50 MG tablet Take 1 tablet (50 mg total) by mouth every 8 (eight) hours as needed.   No facility-administered encounter medications on file as of 09/17/2017.   :  Review of Systems:  Out of a complete 14 point review of systems, all are reviewed and negative with the exception of these symptoms as listed below: Review of Systems  Neurological:       Pt presents today to discuss her sleep. Pt has never had a sleep study but does endorse snoring.  Epworth Sleepiness Scale 0= would never doze 1= slight chance of dozing 2= moderate chance of dozing 3= high chance of dozing  Sitting and reading: 1 Watching TV: 2 Sitting inactive in a public place (ex. Theater or meeting): 0 As a passenger in a car for an hour without a break: 0 Lying down to rest in the afternoon: 2 Sitting and talking to someone: 0 Sitting quietly after lunch (no alcohol): 0 In a car, while stopped in traffic: 0 Total: 5     Objective:  Neurological Exam  Physical Exam Physical Examination:   Vitals:   09/17/17 1332  BP: (!) 146/79  Pulse: 64   General Examination: The patient is a very pleasant 57 y.o. female in no acute distress. She appears well-developed and well-nourished and well groomed.   HEENT: Normocephalic, atraumatic, pupils are equal, round and reactive to light and accommodation. Corrective eyeglasses in place. Extraocular tracking is good without limitation to gaze excursion or nystagmus noted. Normal  smooth pursuit is noted. Hearing is grossly intact. Face is symmetric with normal facial animation and normal facial sensation. Speech is clear with no dysarthria noted. There is no hypophonia. There is no lip, neck/head, jaw or voice tremor. Neck is supple with full range of passive and active motion. There are no carotid bruits on auscultation. Oropharynx exam reveals: mild mouth dryness, adequate dental hygiene and several missing teeth, mild airway crowding secondary to redundant soft palate, tonsils are absent, possible residual tissue on the left noted. Mallampati is class  II. Tongue protrudes centrally and palate elevates symmetrically. Neck circumference is 17-1/8 inches. She has a mild underbite.   Chest: Clear to auscultation without wheezing, rhonchi or crackles noted.  Heart: S1+S2+0, regular and normal without murmurs, rubs or gallops noted.   Abdomen: Soft, non-tender and non-distended with normal bowel sounds appreciated on auscultation.  Extremities: There is no pitting edema in the distal lower extremities bilaterally. Puffy feet.  Skin: Warm and dry without trophic changes noted.  Musculoskeletal: exam reveals no obvious joint deformities, tenderness or joint swelling or erythema.   Neurologically:  Mental status: The patient is awake, alert and oriented in all 4 spheres. Her immediate and remote memory, attention, language skills and fund of knowledge are appropriate. There is no evidence of aphasia, agnosia, apraxia or anomia. Speech is clear with normal prosody and enunciation. Thought process is linear. Mood is normal and affect is normal.  Cranial nerves II - XII are as described above under HEENT exam. In addition: shoulder shrug is normal with equal shoulder height noted. Motor exam: Normal bulk, strength and tone is noted. There is no drift, tremor or rebound. Romberg is negative. Reflexes are 1+ throughout. Fine motor skills and coordination: grossly intact.  Cerebellar  testing: No dysmetria or intention tremor. There is no truncal or gait ataxia.  Sensory exam: intact to light touch.  Gait, station and balance: She stands easily. No veering to one side is noted. No leaning to one side is noted. Posture is age-appropriate and stance is narrow based. Gait shows normal stride length and normal pace. No problems turning are noted. Tandem walk is unremarkable.   Assessment and Plan:  In summary, VINCY FELIZ is a very pleasant 57 y.o.-year old female with an underlying medical history of hepatitis C, hyperlipidemia, palpitations, allergic rhinitis, asthma, degenerative disc disease, and morbid obesity with a BMI of over 45, whose history and physical exam are concerning for obstructive sleep apnea (OSA). I had a long chat with the patient about my findings and the diagnosis of OSA, its prognosis and treatment options. We talked about medical treatments, surgical interventions and non-pharmacological approaches. I explained in particular the risks and ramifications of untreated moderate to severe OSA, especially with respect to developing cardiovascular disease down the Road, including congestive heart failure, difficult to treat hypertension, cardiac arrhythmias, or stroke. Even type 2 diabetes has, in part, been linked to untreated OSA. Symptoms of untreated OSA include daytime sleepiness, memory problems, mood irritability and mood disorder such as depression and anxiety, lack of energy, as well as recurrent headaches, especially morning headaches. We talked about trying to maintain a healthy lifestyle in general, as well as the importance of weight control. I encouraged the patient to eat healthy, exercise daily and keep well hydrated, to keep a scheduled bedtime and wake time routine, to not skip any meals and eat healthy snacks in between meals. I advised the patient not to drive when feeling sleepy. I recommended the following at this time: sleep study with potential  positive airway pressure titration. (We will score hypopneas at 3%).   I explained the sleep test procedure to the patient and also outlined possible surgical and non-surgical treatment options of OSA, including the use of a custom-made dental device (which would require a referral to a specialist dentist or oral surgeon), upper airway surgical options, such as pillar implants, radiofrequency surgery, tongue base surgery, and UPPP (which would involve a referral to an ENT surgeon). Rarely, jaw surgery such as mandibular  advancement may be considered.  I also explained the CPAP treatment option to the patient, who indicated that she would be willing to try CPAP if the need arises. I explained the importance of being compliant with PAP treatment, not only for insurance purposes but primarily to improve Her symptoms, and for the patient's long term health benefit, including to reduce Her cardiovascular risks. I answered all her questions today and the patient was in agreement. I would like to see her back after the sleep study is completed and encouraged her to call with any interim questions, concerns, problems or updates.   Thank you very much for allowing me to participate in the care of this nice patient. If I can be of any further assistance to you please do not hesitate to call me at [phone].  Sincerely,   Huston Foley, MD, PhD

## 2017-10-08 ENCOUNTER — Ambulatory Visit (INDEPENDENT_AMBULATORY_CARE_PROVIDER_SITE_OTHER): Payer: BC Managed Care – PPO | Admitting: Neurology

## 2017-10-08 DIAGNOSIS — R0683 Snoring: Secondary | ICD-10-CM

## 2017-10-08 DIAGNOSIS — R002 Palpitations: Secondary | ICD-10-CM

## 2017-10-08 DIAGNOSIS — G4733 Obstructive sleep apnea (adult) (pediatric): Secondary | ICD-10-CM | POA: Diagnosis not present

## 2017-10-08 DIAGNOSIS — R51 Headache: Secondary | ICD-10-CM

## 2017-10-08 DIAGNOSIS — Z820 Family history of epilepsy and other diseases of the nervous system: Secondary | ICD-10-CM

## 2017-10-08 DIAGNOSIS — Z6841 Body Mass Index (BMI) 40.0 and over, adult: Secondary | ICD-10-CM

## 2017-10-08 DIAGNOSIS — G4720 Circadian rhythm sleep disorder, unspecified type: Secondary | ICD-10-CM

## 2017-10-08 DIAGNOSIS — R351 Nocturia: Secondary | ICD-10-CM

## 2017-10-08 DIAGNOSIS — R519 Headache, unspecified: Secondary | ICD-10-CM

## 2017-10-14 ENCOUNTER — Institutional Professional Consult (permissible substitution): Payer: BC Managed Care – PPO | Admitting: Neurology

## 2017-10-14 NOTE — Procedures (Signed)
PATIENT'S NAME:  Mackenzie Everett, Mackenzie Everett DOB:      05-Jan-1961      MR#:    [id number]     DATE OF RECORDING: 10/08/2017 REFERRING M.D.:  Truett MainlandManish Patwardhan, MD Study Performed:   Baseline Polysomnogram HISTORY: 57 year old right-handed woman with a history of hepatitis C, hyperlipidemia, palpitations, allergic rhinitis, asthma, degenerative disc disease, and morbid obesity with a BMI of over 45, who reports snoring and sleep disruption as well as palpitations, which started in March. The patient endorsed the Epworth Sleepiness Scale at 5 points. The patient's weight 333 pounds with a height of 69 (inches), resulting in a BMI of 49.3 kg/m2. The patient's neck circumference measured 17 inches.  CURRENT MEDICATIONS: Clarinex, Hydrodiuril, Cozaar, Toprol, Ultram   PROCEDURE:  This is a multichannel digital polysomnogram utilizing the Somnostar 11.2 system.  Electrodes and sensors were applied and monitored per AASM Specifications.   EEG, EOG, Chin and Limb EMG, were sampled at 200 Hz.  ECG, Snore and Nasal Pressure, Thermal Airflow, Respiratory Effort, CPAP Flow and Pressure, Oximetry was sampled at 50 Hz. Digital video and audio were recorded.      BASELINE STUDY  Lights Out was at 22:43 and Lights On at 05:14.  Total recording time (TRT) was 392 minutes, with a total sleep time (TST) of  275.5 minutes.   The patient's sleep latency was 77 minutes, which is delayed. REM latency was 191 minutes, which is delayed. The sleep efficiency was 70.3 %, which is reduced.     SLEEP ARCHITECTURE: WASO (Wake after sleep onset) was 58 minutes with mild to moderate sleep fragmentation noted. There were 31 minutes in Stage N1, 122 minutes Stage N2, 81 minutes Stage N3 and 41.5 minutes in Stage REM.  The percentage of Stage N1 was 11.3%, which is increased, Stage N2 was 44.3%, Stage N3 was 29.4%, which is increased, and Stage R (REM sleep) was 15.1%, which is reduced.  The arousals were noted as: 24 were spontaneous, 0 were  associated with PLMs, 12 were associated with respiratory events.  RESPIRATORY ANALYSIS:  There were a total of 43 respiratory events:  8 obstructive apneas, 0 central apneas and 0 mixed apneas with a total of 8 apneas and an apnea index (AI) of 1.7 /hour. There were 35 hypopneas with a hypopnea index of 7.6 /hour. The patient also had 0 respiratory event related arousals (RERAs).      The total APNEA/HYPOPNEA INDEX (AHI) was 9.4/hour and the total RESPIRATORY DISTURBANCE INDEX was 0. 9.4 /hour.  39 events occurred in REM sleep and 6 events in NREM. The REM AHI was 56.4/hour, versus a non-REM AHI of 1.. The patient spent 43.5 minutes of total sleep time in the supine position and 232 minutes in non-supine.. The supine AHI was 30.4/hour versus a non-supine AHI of 5.4.  OXYGEN SATURATION & C02:  The Wake baseline 02 saturation was 98%, with the lowest being 83%. Time spent below 89% saturation equaled 10 minutes.    PERIODIC LIMB MOVEMENTS: The patient had a total of 0 Periodic Limb Movements.  The Periodic Limb Movement (PLM) index was 0 and the PLM Arousal index was 0/hour.  Audio and video analysis did not show any abnormal or unusual movements, behaviors, phonations or vocalizations. The patient took no bathroom breaks. Moderate to loud snoring was noted. The EKG was in keeping with normal sinus rhythm (NSR).  Post-study, the patient indicated that sleep was the same as usual.   IMPRESSION:  1. Obstructive Sleep Apnea (  OSA) 2. Dysfunctions associated with sleep stages or arousals from sleep  RECOMMENDATIONS:  1. This study demonstrates overall mild obstructive sleep apnea, severe in REM sleep and during supine sleep, with a total AHI of 9.4/hour, REM AHI of 56.4/hour, supine AHI of 30.4/hour and O2 nadir of 83%. Given the patient's medical history and sleep related complaints, treatment with positive airway pressure is advisable. A full-night CPAP titration study is recommended to optimize  therapy. Other treatment options may include avoidance of supine sleep position along with weight loss, upper airway or jaw surgery in selected patients or the use of an oral appliance in certain patients. ENT evaluation and/or consultation with a maxillofacial surgeon or dentist may be feasible in some instances.    2. Please note that untreated obstructive sleep apnea may carry additional perioperative morbidity. Patients with significant obstructive sleep apnea (typically, in the moderate to severe degree) should receive, if possible, perioperative PAP (positive airway pressure) therapy and the surgeons and particularly the anesthesiologists should be informed of the diagnosis and the severity of the sleep disordered breathing. If feasible, the patient may be asked to bring his/her CPAP or BiPAP machine for a planned/elective surgery.  3. This study shows sleep fragmentation and abnormal sleep stage percentages; these are nonspecific findings and per se do not signify an intrinsic sleep disorder or a cause for the patient's sleep-related symptoms. Causes include (but are not limited to) the first night effect of the sleep study, circadian rhythm disturbances, medication effect or an underlying mood disorder or medical problem.  4. The patient should be cautioned not to drive, work at heights, or operate dangerous or heavy equipment when tired or sleepy. Review and reiteration of good sleep hygiene measures should be pursued with any patient. 5. The patient will be seen in follow-up in the sleep clinic at [facility] for discussion of the test results, symptom and treatment compliance review, further management strategies, etc. The referring provider will be notified of the test results.   I certify that I have reviewed the entire raw data recording prior to the issuance of this report in accordance with the Standards of Accreditation of the American Academy of Sleep Medicine (AASM)   Huston Foley, MD,  PhD Diplomat, American Board of Psychiatry and Neurology (Neurology and Sleep Medicine)

## 2017-10-14 NOTE — Addendum Note (Signed)
Addended by: Huston FoleyATHAR, Alezandra Egli on: 10/14/2017 06:00 PM   Modules accepted: Orders

## 2017-10-14 NOTE — Progress Notes (Signed)
Patient referred by Dr. Rosemary HolmsPatwardhan, seen by me on 09/17/17, diagnostic PSG on 10/08/17.   Please call and notify the patient that the recent sleep study showed overall mild obstructive sleep apnea, severe in REM sleep and during supine sleep, with a total AHI of 9.4/hour, REM AHI of 56.4/hour, supine AHI of 30.4/hour and O2 nadir of 83%. I recommend treatment for this in the form of CPAP. This will require a repeat sleep study for proper titration and mask fitting and correct monitoring of the oxygen saturations. Please explain to patient. I have placed an order in the chart. Thanks.  Huston FoleySaima Zacchary Pompei, MD, PhD [facility])

## 2017-10-15 ENCOUNTER — Telehealth: Payer: Self-pay

## 2017-10-15 NOTE — Telephone Encounter (Signed)
I called pt. I advised pt that Dr. Frances FurbishAthar reviewed their sleep study results and found that has mild osa overall, but severe osa in REM and supine sleep and recommends that pt be treated with a cpap. Dr. Frances FurbishAthar recommends that pt return for a repeat sleep study in order to properly titrate the cpap and ensure a good mask fit. Pt is agreeable to returning for a titration study. I advised pt that our sleep lab will file with pt's insurance and call pt to schedule the sleep study when we hear back from the pt's insurance regarding coverage of this sleep study. Pt verbalized understanding of results. Pt had no questions at this time but was encouraged to call back if questions arise.

## 2017-10-15 NOTE — Telephone Encounter (Signed)
-----   Message from Huston FoleySaima Athar, MD sent at 10/14/2017  6:00 PM EDT ----- Patient referred by Dr. Rosemary HolmsPatwardhan, seen by me on 09/17/17, diagnostic PSG on 10/08/17.   Please call and notify the patient that the recent sleep study showed overall mild obstructive sleep apnea, severe in REM sleep and during supine sleep, with a total AHI of 9.4/hour, REM AHI of 56.4/hour, supine AHI of 30.4/hour and O2 nadir of 83%. I recommend treatment for this in the form of CPAP. This will require a repeat sleep study for proper titration and mask fitting and correct monitoring of the oxygen saturations. Please explain to patient. I have placed an order in the chart. Thanks.  Huston FoleySaima Athar, MD, PhD [facility])

## 2017-10-27 ENCOUNTER — Telehealth: Payer: Self-pay | Admitting: Neurology

## 2017-10-27 NOTE — Telephone Encounter (Signed)
We have attempted to call the patient 2 times to schedule sleep study. Patient has been unavailable at the phone numbers we have on file and has not returned our calls. At this point we will send a letter asking pt to please contact the sleep lab to schedule their sleep study. If patient calls back we will schedule them for their sleep study. ° °

## 2017-11-26 ENCOUNTER — Other Ambulatory Visit: Payer: Self-pay | Admitting: Family Medicine

## 2017-11-26 DIAGNOSIS — J309 Allergic rhinitis, unspecified: Secondary | ICD-10-CM

## 2018-01-07 ENCOUNTER — Other Ambulatory Visit: Payer: Self-pay

## 2018-01-07 ENCOUNTER — Emergency Department (HOSPITAL_BASED_OUTPATIENT_CLINIC_OR_DEPARTMENT_OTHER)
Admission: EM | Admit: 2018-01-07 | Discharge: 2018-01-07 | Disposition: A | Discharge status: Home / Self Care | Payer: BC Managed Care – PPO | Attending: Emergency Medicine | Admitting: Emergency Medicine

## 2018-01-07 ENCOUNTER — Emergency Department (HOSPITAL_BASED_OUTPATIENT_CLINIC_OR_DEPARTMENT_OTHER): Payer: BC Managed Care – PPO

## 2018-01-07 ENCOUNTER — Encounter (HOSPITAL_BASED_OUTPATIENT_CLINIC_OR_DEPARTMENT_OTHER): Payer: Self-pay | Admitting: Emergency Medicine

## 2018-01-07 DIAGNOSIS — S92535A Nondisplaced fracture of distal phalanx of left lesser toe(s), initial encounter for closed fracture: Secondary | ICD-10-CM

## 2018-01-07 DIAGNOSIS — Y92002 Bathroom of unspecified non-institutional (private) residence single-family (private) house as the place of occurrence of the external cause: Secondary | ICD-10-CM | POA: Diagnosis not present

## 2018-01-07 DIAGNOSIS — Y93E1 Activity, personal bathing and showering: Secondary | ICD-10-CM | POA: Diagnosis not present

## 2018-01-07 DIAGNOSIS — Z79899 Other long term (current) drug therapy: Secondary | ICD-10-CM | POA: Insufficient documentation

## 2018-01-07 DIAGNOSIS — I10 Essential (primary) hypertension: Secondary | ICD-10-CM | POA: Diagnosis not present

## 2018-01-07 DIAGNOSIS — S6992XA Unspecified injury of left wrist, hand and finger(s), initial encounter: Secondary | ICD-10-CM | POA: Diagnosis present

## 2018-01-07 DIAGNOSIS — Y999 Unspecified external cause status: Secondary | ICD-10-CM | POA: Insufficient documentation

## 2018-01-07 DIAGNOSIS — S63502A Unspecified sprain of left wrist, initial encounter: Secondary | ICD-10-CM | POA: Diagnosis not present

## 2018-01-07 DIAGNOSIS — W010XXA Fall on same level from slipping, tripping and stumbling without subsequent striking against object, initial encounter: Secondary | ICD-10-CM | POA: Insufficient documentation

## 2018-01-07 NOTE — ED Notes (Signed)
ED Provider at bedside. 

## 2018-01-07 NOTE — Discharge Instructions (Signed)
Please read and follow all provided instructions.  Your diagnoses today include:  1. Closed nondisplaced fracture of distal phalanx of lesser toe of left foot, initial encounter   2. Sprain of left wrist, initial encounter     Tests performed today include:  An x-ray of the affected areas -no wrist fracture, avulsion fracture of the toe noted  Vital signs. See below for your results today.   Medications prescribed:   None  Take any prescribed medications only as directed.  Home care instructions:   Follow any educational materials contained in this packet  Follow R.I.C.E. Protocol:  R - rest your injury   I  - use ice on injury without applying directly to skin  C - compress injury with bandage or splint  E - elevate the injury as much as possible  Follow-up instructions: Please follow-up with your primary care provider or the provided orthopedic physician (bone specialist) if you continue to have significant pain in 1 week. In this case you may have a more severe injury that requires further care.   Return instructions:   Please return if your toes or feet are numb or tingling, appear gray or blue, or you have severe pain (also elevate the leg and loosen splint or wrap if you were given one)  Please return to the Emergency Department if you experience worsening symptoms.   Please return if you have any other emergent concerns.  Additional Information:  Your vital signs today were: BP 139/64 (BP Location: Right Arm)    Pulse 60    Temp 98.7 F (37.1 C) (Oral)    Resp 18    Ht 5\' 9"  (1.753 m)    Wt 104.3 kg    SpO2 100%    BMI 33.97 kg/m  If your blood pressure (BP) was elevated above 135/85 this visit, please have this repeated by your doctor within one month. --------------

## 2018-01-07 NOTE — ED Provider Notes (Signed)
[facility] EMERGENCY DEPARTMENT Provider Note   CSN: [id number] Arrival date & time: 01/07/18  1142     History   Chief Complaint Chief Complaint  Patient presents with  . Fall    HPI Mackenzie Everett is a 57 y.o. female.  Patient presents with acute onset of left wrist and left second toe pain starting 2 days ago.  Patient states that she was on vacation in [place] when she slipped getting out of a shower.  She fell onto an outstretched left wrist and injured her toe at the same time.  She has been having difficulty walking since the incident and has been walking on her heel.  She also has difficulty bending her left wrist.  She denies other injuries.  Over-the-counter medications prior to arrival.  She has not been assessed for this injury yet because she wanted to wait before until she got back to the [place].  No numbness or tingling.     Past Medical History:  Diagnosis Date  . Allergy   . Arthritis    hands and knees  . Asthma   . Hyperlipidemia 09/04/2011  . Hypertension     Patient Active Problem List   Diagnosis Date Noted  . Hepatitis C 01/19/2016  . Obesity, Class III, BMI 40-49.9 (morbid obesity) (HCC) 09/04/2011  . Hyperlipidemia 09/04/2011  . Migraine variant 09/04/2011  . Asthma with allergic rhinitis 09/04/2011  . Degenerative disc disease, lumbar 09/04/2011    Past Surgical History:  Procedure Laterality Date  . APPENDECTOMY    . BREAST SURGERY    . KNEE SURGERY     1994  . TUBAL LIGATION       OB History   None      Home Medications    Prior to Admission medications   Medication Sig Start Date End Date Taking? Authorizing Provider  desloratadine (CLARINEX) 5 MG tablet TAKE 1 TABLET(5 MG) BY MOUTH DAILY 11/26/17   Shade Flood, MD  hydrochlorothiazide (HYDRODIURIL) 12.5 MG tablet TAKE 1 TABLET(12.5 MG) BY MOUTH DAILY 07/07/17   Shade Flood, MD  losartan (COZAAR) 50 MG tablet Take 1 tablet (50 mg total) by mouth daily.  07/07/17   Shade Flood, MD  metoprolol succinate (TOPROL-XL) 25 MG 24 hr tablet Take 25 mg by mouth daily. 09/07/17   [provider]  traMADol (ULTRAM) 50 MG tablet Take 1 tablet (50 mg total) by mouth every 8 (eight) hours as needed. 04/15/17   Peyton Najjar, MD    Family History Family History  Problem Relation Age of Onset  . Arthritis Mother   . Diabetes Mother   . Hyperlipidemia Mother   . Hypertension Mother   . Hyperlipidemia Sister   . Hypertension Sister   . Lupus Sister   . Diabetes Maternal Grandmother   . Heart disease Maternal Grandfather   . Heart disease Paternal Grandfather   . Cancer Maternal Aunt     Social History Social History   Tobacco Use  . Smoking status: Never Smoker  . Smokeless tobacco: Never Used  Substance Use Topics  . Alcohol use: Yes    Comment: social  . Drug use: No     Allergies   Caffeine and Penicillins   Review of Systems Review of Systems  Constitutional: Negative for activity change.  Musculoskeletal: Positive for arthralgias and gait problem. Negative for back pain, joint swelling and neck pain.  Skin: Negative for wound.  Neurological: Negative for weakness  and numbness.     Physical Exam Updated Vital Signs BP 139/64 (BP Location: Right Arm)   Pulse 60   Temp 98.7 F (37.1 C) (Oral)   Resp 18   Ht 5\' 9"  (1.753 m)   Wt 104.3 kg   SpO2 100%   BMI 33.97 kg/m   Physical Exam  Constitutional: She appears well-developed and well-nourished.  HENT:  Head: Normocephalic and atraumatic.  Eyes: Pupils are equal, round, and reactive to light.  Neck: Normal range of motion. Neck supple.  Cardiovascular: Normal pulses. Exam reveals no decreased pulses.  Musculoskeletal: She exhibits tenderness. She exhibits no edema.       Left shoulder: Normal.       Left elbow: Normal.       Left wrist: She exhibits decreased range of motion and tenderness. She exhibits no bony tenderness and no swelling.       Left  knee: Normal.       Left ankle: Normal.       Left hand: Normal.       Left foot: There is decreased range of motion, tenderness (Distal left second toe), bony tenderness and swelling.  Neurological: She is alert. No sensory deficit.  Motor, sensation, and vascular distal to the injury is fully intact.   Skin: Skin is warm and dry.  Psychiatric: She has a normal mood and affect.  Nursing note and vitals reviewed.    ED Treatments / Results  Labs (all labs ordered are listed, but only abnormal results are displayed) Labs Reviewed - No data to display  EKG None  Radiology Dg Wrist Complete Left  Result Date: 01/07/2018 CLINICAL DATA:  Slipped in the bathroom 2 days ago and tried to catch herself. Persistent left posterior wrist pain. EXAM: LEFT WRIST - COMPLETE 3+ VIEW COMPARISON:  None. FINDINGS: The bones are subjectively adequately mineralized. The distal radius and ulna are intact. The carpal bones are intact. Specific attention to the scaphoid reveals no acute abnormality. The metacarpal bases are normal. There is mild degenerative change of the first [facility] joint. IMPRESSION: There is no acute or significant chronic bony abnormality of the left wrist. Electronically Signed   By: David  [place] M.D.   On: 01/07/2018 12:57   Dg Toe 2nd Left  Result Date: 01/07/2018 CLINICAL DATA:  The patient slipped in the bathroom 2 days ago and is complaining of pain in the distal aspect of the second toe. EXAM: LEFT SECOND TOE COMPARISON:  None. FINDINGS: The bones of the second toe are subjectively adequately mineralized. There is an acute avulsion fracture from the dorsal aspect of the base of the distal phalanx. This likely involves the insertion of the extensor tendon. The middle and proximal phalanges are intact. IMPRESSION: There is an acute avulsion fracture from the dorsal aspect of the base of the distal phalanx of the left second toe which likely involves the insertion of the extensor tendon.  Electronically Signed   By: David  [place] M.D.   On: 01/07/2018 12:59    Procedures Procedures (including critical care time)  Medications Ordered in ED Medications - No data to display   Initial Impression / Assessment and Plan / ED Course  I have reviewed the triage vital signs and the nursing notes.  Pertinent labs & imaging results that were available during my care of the patient were reviewed by me and considered in my medical decision making (see chart for details).     Patient seen and examined.  Vital signs reviewed and are as follows: BP 139/64 (BP Location: Right Arm)   Pulse 60   Temp 98.7 F (37.1 C) (Oral)   Resp 18   Ht 5\' 9"  (1.753 m)   Wt 104.3 kg   SpO2 100%   BMI 33.97 kg/m   Patient will be given a wrist splint and and buddy tape and hard sole sandal.  Encouraged rice protocol and NSAIDs for the next week and follow-up with sports medicine if not improving.  Final Clinical Impressions(s) / ED Diagnoses   Final diagnoses:  Closed nondisplaced fracture of distal phalanx of lesser toe of left foot, initial encounter  Sprain of left wrist, initial encounter   Patient with avulsion fracture of her left second toe and a left wrist sprain.  We discussed possibility of occult fracture and need to follow-up if symptoms are not improving.  No neurovascular compromise.  Patient appears well.  Continue supportive treatment.  ED Discharge Orders    None       Renne Crigler, [place] 01/07/18 1426    Tegeler, Canary Brim, MD 01/07/18 [phone]

## 2018-01-07 NOTE — ED Triage Notes (Signed)
Larey Seat 2 days ago in [employer].  Bath mat slipped and she fell forward onto hands.  Pain to left wrist and Left 2nd toe. Sts she has no grip in left hand due to pain.

## 2018-01-17 ENCOUNTER — Ambulatory Visit: Payer: BC Managed Care – PPO | Admitting: Family Medicine

## 2018-02-02 ENCOUNTER — Ambulatory Visit: Payer: BC Managed Care – PPO | Admitting: Family Medicine

## 2018-03-16 ENCOUNTER — Telehealth: Payer: Self-pay | Admitting: Family Medicine

## 2018-03-16 NOTE — Telephone Encounter (Signed)
Copied from CRM [phone]#[id number]. Topic: Quick Communication - Rx Refill/Question >> Mar 16, 2018 11:26 AM Lynne LoganHudson, Caryn D wrote: Medication: losartan (COZAAR) 50 MG tablet / Pt stated that [place] is out of [place] in [place]. [facility] but [facility] 25 MG or 100 MG . Please adjust rx to suite one of those options. Please advise.   Has the patient contacted their pharmacy? Yes.   (Agent: If no, request that the patient contact the pharmacy for the refill.) (Agent: If yes, when and what did the pharmacy advise?)  Preferred Pharmacy (with phone number or street name): [facility] [id number] [facility] [place], [place] - [address] [phone] (Phone) [phone] (Fax)    Agent: Please be advised that RX refills may take up to 3 business days. We ask that you follow-up with your pharmacy.

## 2018-03-16 NOTE — Telephone Encounter (Signed)
Pharmacy does not have dosage patient needs- will need to adjust for patient Rx. Attempted to call patient to see how long she will be out of town- left message on voice mail to contact office to let us know.

## 2018-03-16 NOTE — Telephone Encounter (Signed)
Patient called and asked if she has tried any other pharmacies, [facility] and she will wait on the new prescription.  Losartan 50 mg is out of stock and [facility] Losartan 25 mg and 100 mg. A new Rx will need to be submitted for a 90 day supply at patient request. She is out of town until the end of January.  [facility] [id number] [facility] [place], [place] - [address] [phone] (Phone) [phone] (Fax)

## 2018-03-16 NOTE — Telephone Encounter (Signed)
Pt called in stating she will be in [place] until the end of January. A 3 month supply would be best if possible.  [facility] [id number] [facility] [place] - [address]      [phone] (Phone) [phone] (Fax)

## 2018-03-19 ENCOUNTER — Other Ambulatory Visit: Payer: Self-pay | Admitting: *Deleted

## 2018-03-19 NOTE — Telephone Encounter (Signed)
Prescription was called into pharmacy.  25 mg twice a day.

## 2018-04-06 ENCOUNTER — Other Ambulatory Visit: Payer: Self-pay | Admitting: Family Medicine

## 2018-04-06 DIAGNOSIS — I10 Essential (primary) hypertension: Secondary | ICD-10-CM

## 2018-04-06 NOTE — Telephone Encounter (Signed)
Courtesy refill sent in  

## 2018-04-07 ENCOUNTER — Other Ambulatory Visit: Payer: Self-pay | Admitting: Family Medicine

## 2018-04-07 DIAGNOSIS — J309 Allergic rhinitis, unspecified: Secondary | ICD-10-CM

## 2018-04-07 NOTE — Telephone Encounter (Signed)
Requested Prescriptions  Pending Prescriptions Disp Refills  . desloratadine (CLARINEX) 5 MG tablet [Pharmacy Med Name: DESLORATADINE 5 MG TABLET] 90 tablet 0    Sig: TAKE 1 TABLET(5 MG) BY MOUTH DAILY     Ear, Nose, and Throat:  Antihistamines Passed - 04/07/2018  1:35 AM      Passed - Valid encounter within last 12 months    Recent Outpatient Visits          9 months ago Allergic rhinitis, unspecified seasonality, unspecified trigger   [facility], Asencion Partridge, MD   9 months ago Fatigue, unspecified type   [facility], Asencion Partridge, MD   11 months ago Acute nonintractable headache, unspecified headache type   [facility], Sandria Bales, MD   1 year ago Essential hypertension   [facility], Manus Rudd, MD   1 year ago Essential hypertension   [facility], Asencion Partridge, MD      Future Appointments            In 1 month Neva Seat Asencion Partridge, MD [facility], [facility]

## 2018-05-18 ENCOUNTER — Encounter: Payer: Self-pay | Admitting: Family Medicine

## 2018-05-18 ENCOUNTER — Ambulatory Visit: Payer: BC Managed Care – PPO | Admitting: Family Medicine

## 2018-05-18 ENCOUNTER — Other Ambulatory Visit: Payer: Self-pay

## 2018-05-18 VITALS — BP 139/79 | HR 88 | Temp 98.6°F | Resp 16 | Ht 70.0 in | Wt 327.6 lb

## 2018-05-18 DIAGNOSIS — Z1329 Encounter for screening for other suspected endocrine disorder: Secondary | ICD-10-CM | POA: Diagnosis not present

## 2018-05-18 DIAGNOSIS — I10 Essential (primary) hypertension: Secondary | ICD-10-CM

## 2018-05-18 DIAGNOSIS — G4733 Obstructive sleep apnea (adult) (pediatric): Secondary | ICD-10-CM

## 2018-05-18 DIAGNOSIS — J309 Allergic rhinitis, unspecified: Secondary | ICD-10-CM

## 2018-05-18 DIAGNOSIS — Z13220 Encounter for screening for lipoid disorders: Secondary | ICD-10-CM

## 2018-05-18 MED ORDER — LOSARTAN POTASSIUM 25 MG PO TABS: 50.0000 mg | ORAL_TABLET | Freq: Every day | ORAL | 1 refills | Status: DC

## 2018-05-18 MED ORDER — HYDROCHLOROTHIAZIDE 12.5 MG PO TABS: ORAL_TABLET | ORAL | 1 refills | Status: DC

## 2018-05-18 MED ORDER — METOPROLOL SUCCINATE ER 25 MG PO TB24: 25.0000 mg | ORAL_TABLET | Freq: Every day | ORAL | 1 refills | Status: DC

## 2018-05-18 NOTE — Progress Notes (Signed)
Subjective:    Patient ID: Mackenzie Everett, female    DOB: May 03, 1960, 58 y.o.   MRN: [id number]  HPI Mackenzie Everett is a 58 y.o. female Presents today for: Chief Complaint  Patient presents with  . Hypertension    was last seen here for hypertension on 11/07/2016. Need refill on medication. Has been taking 2 -25mg  tab losartan(manufacturing issus)   Hypertension: BP Readings from Last 3 Encounters:  05/18/18 139/79  01/07/18 139/64  09/17/17 (!) 146/79   Lab Results  Component Value Date   CREATININE 0.83 06/30/2017  Losartan total dose of 50 mg daily, Toprol-XL 25 mg daily, HCTZ 12.5 mg daily. Underwent sleep study last year, mild OSA overall (AHI 9.4) but severe OSA  REM and supine sleep(AHI 56, 30) recommended CPAP. Has not been back for sleep study - had to take care of dad past 4 months - Afib, TIA. No home BP readings.  No new side effects.  Allergies/allergic rhinitis: On claritin year round.  Nasal spray as needed in past.    Patient Active Problem List   Diagnosis Date Noted  . Hepatitis C 01/19/2016  . Obesity, Class III, BMI 40-49.9 (morbid obesity) (HCC) 09/04/2011  . Hyperlipidemia 09/04/2011  . Migraine variant 09/04/2011  . Asthma with allergic rhinitis 09/04/2011  . Degenerative disc disease, lumbar 09/04/2011   Past Medical History:  Diagnosis Date  . Allergy   . Arthritis    hands and knees  . Asthma   . Hyperlipidemia 09/04/2011  . Hypertension    Past Surgical History:  Procedure Laterality Date  . APPENDECTOMY    . BREAST SURGERY    . KNEE SURGERY     1994  . TUBAL LIGATION     Allergies  Allergen Reactions  . Caffeine Other (See Comments)    migraines  . Penicillins Hives   Prior to Admission medications   Medication Sig Start Date End Date Taking? Authorizing Provider  desloratadine (CLARINEX) 5 MG tablet TAKE 1 TABLET(5 MG) BY MOUTH DAILY 04/07/18  Yes Shade Flood, MD  hydrochlorothiazide (HYDRODIURIL) 12.5 MG tablet TAKE 1  TABLET(12.5 MG) BY MOUTH DAILY 04/06/18  Yes Shade Flood, MD  losartan (COZAAR) 50 MG tablet Take 1 tablet (50 mg total) by mouth daily. 07/07/17  Yes Shade Flood, MD  metoprolol succinate (TOPROL-XL) 25 MG 24 hr tablet Take 25 mg by mouth daily. 09/07/17  Yes [provider]  traMADol (ULTRAM) 50 MG tablet Take 1 tablet (50 mg total) by mouth every 8 (eight) hours as needed. 04/15/17  Yes Peyton Najjar, MD   Social History   Socioeconomic History  . Marital status: Single    Spouse name: Not on file  . Number of children: Not on file  . Years of education: Not on file  . Highest education level: Not on file  Occupational History  . Not on file  Social Needs  . Financial resource strain: Not on file  . Food insecurity:    Worry: Not on file    Inability: Not on file  . Transportation needs:    Medical: Not on file    Non-medical: Not on file  Tobacco Use  . Smoking status: Never Smoker  . Smokeless tobacco: Never Used  Substance and Sexual Activity  . Alcohol use: Yes    Comment: social  . Drug use: No  . Sexual activity: Yes    Birth control/protection: Post-menopausal  Lifestyle  . Physical activity:  Days per week: Not on file    Minutes per session: Not on file  . Stress: Not on file  Relationships  . Social connections:    Talks on phone: Not on file    Gets together: Not on file    Attends religious service: Not on file    Active member of club or organization: Not on file    Attends meetings of clubs or organizations: Not on file    Relationship status: Not on file  . Intimate partner violence:    Fear of current or ex partner: Not on file    Emotionally abused: Not on file    Physically abused: Not on file    Forced sexual activity: Not on file  Other Topics Concern  . Not on file  Social History Narrative   Lives at home alone    Review of Systems  Constitutional: Negative for fatigue and unexpected weight change.  Respiratory:  Negative for chest tightness and shortness of breath.   Cardiovascular: Negative for chest pain, palpitations and leg swelling.  Gastrointestinal: Negative for abdominal pain and blood in stool.  Neurological: Negative for dizziness, syncope, light-headedness and headaches.       Objective:   Physical Exam Vitals signs reviewed.  Constitutional:      Appearance: She is well-developed.  HENT:     Head: Normocephalic and atraumatic.  Eyes:     Conjunctiva/sclera: Conjunctivae normal.     Pupils: Pupils are equal, round, and reactive to light.  Neck:     Vascular: No carotid bruit.  Cardiovascular:     Rate and Rhythm: Normal rate and regular rhythm.     Heart sounds: Normal heart sounds.  Pulmonary:     Effort: Pulmonary effort is normal.     Breath sounds: Normal breath sounds.  Abdominal:     Palpations: Abdomen is soft. There is no pulsatile mass.     Tenderness: There is no abdominal tenderness.  Skin:    General: Skin is warm and dry.  Neurological:     Mental Status: She is alert and oriented to person, place, and time.  Psychiatric:        Behavior: Behavior normal.    Vitals:   05/18/18 1358  BP: 139/79  Pulse: 88  Resp: 16  Temp: 98.6 F (37 C)  TempSrc: Oral  SpO2: 99%  Weight: (!) 327 lb 9.6 oz (148.6 kg)  Height: 5\' 10"  (1.778 m)       Assessment & Plan:  Mackenzie Everett is a 58 y.o. female Essential hypertension - Plan: Comprehensive metabolic panel, Lipid Panel Screening for hyperlipidemia - Plan: Lipid Panel Screening for thyroid disorder - Plan: TSH, CANCELED: TSH  - borderline. Check labs, no changes for now but would recommend follow up with sleep specialist given AHI during REM and supine sleep.  Untreated sleep apnea may also be affecting blood pressure.   - Check for thyroid disease and screen for hyperlipidemia.  History of allergic rhinitis, recommended Flonase nasal spray during times of increased allergies and Claritin over-the-counter as  needed.  Recheck 6 months.   No orders of the defined types were placed in this encounter.  Patient Instructions    No change in medications for now.  I do recommend following up with the sleep specialist as treatment of sleep apnea will likely help blood pressure further.  Return in the next week if possible for a lab only visit for fasting blood work.  Flonase nasal  spray can also be used during times of increased allergies, that may work better if started prior to the symptoms worsening.  Continue Claritin over-the-counter as needed.  Follow-up in 6 months, but let me know if there are questions in the meantime.    If you have lab work done today you will be contacted with your lab results within the next 2 weeks.  If you have not heard from [place] then please contact us. The fastest way to get your results is to register for My Chart.   IF you received an x-ray today, you will receive an invoice from [facility]. Please contact [facility] at [phone] with questions or concerns regarding your invoice.   IF you received labwork today, you will receive an invoice from [facility]. Please contact [facility] at [phone] with questions or concerns regarding your invoice.   Our billing staff will not be able to assist you with questions regarding bills from these companies.  You will be contacted with the lab results as soon as they are available. The fastest way to get your results is to activate your My Chart account. Instructions are located on the last page of this paperwork. If you have not heard from [place] regarding the results in 2 weeks, please contact this office.       Signed,   Meredith Staggers, MD Primary Care at [facility].  05/18/18 2:33 PM

## 2018-05-18 NOTE — Patient Instructions (Addendum)
  No change in medications for now.  I do recommend following up with the sleep specialist as treatment of sleep apnea will likely help blood pressure further.  Return in the next week if possible for a lab only visit for fasting blood work.  Flonase nasal spray can also be used during times of increased allergies, that may work better if started prior to the symptoms worsening.  Continue Claritin over-the-counter as needed.  Follow-up in 6 months, but let me know if there are questions in the meantime.    If you have lab work done today you will be contacted with your lab results within the next 2 weeks.  If you have not heard from [place] then please contact us. The fastest way to get your results is to register for My Chart.   IF you received an x-ray today, you will receive an invoice from [facility]. Please contact [facility] at [phone] with questions or concerns regarding your invoice.   IF you received labwork today, you will receive an invoice from [facility]. Please contact [facility] at [phone] with questions or concerns regarding your invoice.   Our billing staff will not be able to assist you with questions regarding bills from these companies.  You will be contacted with the lab results as soon as they are available. The fastest way to get your results is to activate your My Chart account. Instructions are located on the last page of this paperwork. If you have not heard from [place] regarding the results in 2 weeks, please contact this office.

## 2018-06-11 ENCOUNTER — Other Ambulatory Visit: Payer: Self-pay | Admitting: Family Medicine

## 2018-06-11 DIAGNOSIS — I10 Essential (primary) hypertension: Secondary | ICD-10-CM

## 2018-11-17 ENCOUNTER — Encounter: Payer: BC Managed Care – PPO | Admitting: Family Medicine

## 2018-11-19 ENCOUNTER — Other Ambulatory Visit: Payer: Self-pay

## 2018-11-19 ENCOUNTER — Encounter: Payer: Self-pay | Admitting: Family Medicine

## 2018-11-19 ENCOUNTER — Other Ambulatory Visit: Payer: Self-pay | Admitting: Emergency Medicine

## 2018-11-19 ENCOUNTER — Ambulatory Visit (INDEPENDENT_AMBULATORY_CARE_PROVIDER_SITE_OTHER): Payer: BC Managed Care – PPO | Admitting: Family Medicine

## 2018-11-19 VITALS — BP 149/66 | HR 79 | Temp 98.5°F | Resp 14 | Wt 330.3 lb

## 2018-11-19 DIAGNOSIS — Z1329 Encounter for screening for other suspected endocrine disorder: Secondary | ICD-10-CM | POA: Diagnosis not present

## 2018-11-19 DIAGNOSIS — Z23 Encounter for immunization: Secondary | ICD-10-CM

## 2018-11-19 DIAGNOSIS — R0609 Other forms of dyspnea: Secondary | ICD-10-CM

## 2018-11-19 DIAGNOSIS — Z0000 Encounter for general adult medical examination without abnormal findings: Secondary | ICD-10-CM | POA: Diagnosis not present

## 2018-11-19 DIAGNOSIS — Z8742 Personal history of other diseases of the female genital tract: Secondary | ICD-10-CM | POA: Diagnosis not present

## 2018-11-19 DIAGNOSIS — R0600 Dyspnea, unspecified: Secondary | ICD-10-CM

## 2018-11-19 DIAGNOSIS — I10 Essential (primary) hypertension: Secondary | ICD-10-CM

## 2018-11-19 DIAGNOSIS — Z1211 Encounter for screening for malignant neoplasm of colon: Secondary | ICD-10-CM

## 2018-11-19 DIAGNOSIS — Z13220 Encounter for screening for lipoid disorders: Secondary | ICD-10-CM

## 2018-11-19 DIAGNOSIS — Z1239 Encounter for other screening for malignant neoplasm of breast: Secondary | ICD-10-CM

## 2018-11-19 DIAGNOSIS — M25512 Pain in left shoulder: Secondary | ICD-10-CM

## 2018-11-19 DIAGNOSIS — Z124 Encounter for screening for malignant neoplasm of cervix: Secondary | ICD-10-CM

## 2018-11-19 DIAGNOSIS — G5603 Carpal tunnel syndrome, bilateral upper limbs: Secondary | ICD-10-CM

## 2018-11-19 DIAGNOSIS — G4733 Obstructive sleep apnea (adult) (pediatric): Secondary | ICD-10-CM | POA: Diagnosis not present

## 2018-11-19 MED ORDER — SHINGRIX 50 MCG/0.5ML IM SUSR: 0.5000 mL | Freq: Once | INTRAMUSCULAR | 1 refills | Status: AC

## 2018-11-19 NOTE — Progress Notes (Signed)
Subjective:    Patient ID: Mackenzie Everett, female    DOB: [birth year], 58 y.o.   MRN: [id number]  HPI Mackenzie Everett is a 58 y.o. female Presents today for: Chief Complaint  Patient presents with  . Annual Exam    Patient has been having trouble with left arm shoulder decreased range of motion. Have to help left my arm with my other arm at times. Hx of carper tunnal and having trouble with hands going numb  Presents for physical exam with other concerns as above.  Hypertension: BP Readings from Last 3 Encounters:  11/19/18 (!) 149/66  05/18/18 139/79  01/07/18 139/64   Lab Results  Component Value Date   CREATININE 0.83 06/30/2017  Currently on losartan 50 mg daily, Toprol 25 mg daily, HCTZ 12.5 mg daily. Discussed follow-up with sleep specialist regarding possible treatment ( sleep apnea and its effect on blood pressure - severe OSA in REM and supine sleep. Occasional wakes up with HA. No focal weakness or vision changes.  No missed doses, no home readings, but has machine.   55 yo dad at home - trying to be cautious with current pandemic.   Has not called sleep specialist recently due to covid pandemic.   Allergic rhinitis: otc flonase every other day.  No decongestants.   Left shoulder pain/decreased range of motion: Past few months. Occasional soreness to lift during the day, but not persistent, no nighttime symptoms. No injury.  No neck pain.  R hand dominant.  No new exercise or arm activities.  Tx: tylenol - helps.   Hand numbness: Reports history of carpal tunnel syndrome.  Has been experiencing hand numbness, both hands - more notable past 3-4 months. Numbness comes and goes. Notes numbness if sleeping on side at times.  Alternates, but about the same in both. Hand numbness in 1-3rd digits, no foot numbness.  No prior injection or surgery. Used brace in past.  Does a lot of typing - unable to use brace routinely d/t need for typing. Has multiple types of braces at  home - uncomfortable d/t fit on large wrist.   Cancer screening: Colon: No recent colonoscopy noted. Has not had.  Does not want to drink contrast. Agrees to Cologuard.  Breast: Last mammogram less of December 2014. She agrees to testing/referral.  Cervical: Pap testing in September 2016. No regular gyn follow up. Prior endometriosis ablation.  Would like to meet with gynecology, and have pap testing there.   Immunization History  Administered Date(s) Administered  . Influenza, Seasonal, Injecte, Preservative Fre 02/26/2012  . Influenza,inj,Quad PF,6+ Mos 12/20/2014, 04/29/2016, 04/15/2017, 11/19/2018  . Tdap 12/20/2014  Shingles vaccine: agrees.  Flu vaccine given today.   Depression screen [facility] 2/9 11/19/2018 05/18/2018 07/07/2017 06/30/2017 04/15/2017  Decreased Interest 0 0 0 0 0  Down, Depressed, Hopeless 0 0 0 0 0  PHQ - 2 Score 0 0 0 0 0    Hearing Screening   125Hz  250Hz  500Hz  1000Hz  2000Hz  3000Hz  4000Hz  6000Hz  8000Hz   Right ear:           Left ear:             Visual Acuity Screening   Right eye Left eye Both eyes  Without correction:     With correction: 20/20 20/20 20/15   last optho eval in February - wears glasses.   Dentist: no regular care. Some fear of dentistry, prior root canal.   Exercise:  Up to 8,000 steps per day. No  regular exercise. Gets winded at times with exercise past year. Some shortness of breath with walking up hill - past year. No chest pain. Cardiology - [facility], appt last April. eval for dyspnea at that time. Has not followed up since last year. Some increased DOE since last visit a year ago.  No dyspnea at rest.    Wt Readings from Last 3 Encounters:  11/19/18 (!) 330 lb 4.8 oz (149.8 kg)  05/18/18 (!) 327 lb 9.6 oz (148.6 kg)  01/07/18 230 lb (104.3 kg)  Body mass index is 47.39 kg/m. Wt 337 in 06/2017.    Patient Active Problem List   Diagnosis Date Noted  . Hepatitis C 01/19/2016  . Obesity, Class III, BMI 40-49.9 (morbid  obesity) (HCC) 09/04/2011  . Hyperlipidemia 09/04/2011  . Migraine variant 09/04/2011  . Asthma with allergic rhinitis 09/04/2011  . Degenerative disc disease, lumbar 09/04/2011   Past Medical History:  Diagnosis Date  . Allergy   . Arthritis    hands and knees  . Asthma   . Hyperlipidemia 09/04/2011  . Hypertension    Past Surgical History:  Procedure Laterality Date  . APPENDECTOMY    . BREAST SURGERY    . KNEE SURGERY     1994  . TUBAL LIGATION     Allergies  Allergen Reactions  . Caffeine Other (See Comments)    migraines  . Penicillins Hives   Prior to Admission medications   Medication Sig Start Date End Date Taking? Authorizing Provider  desloratadine (CLARINEX) 5 MG tablet TAKE 1 TABLET(5 MG) BY MOUTH DAILY 04/07/18  Yes Shade FloodGreene, Gizel Riedlinger R, MD  hydrochlorothiazide (HYDRODIURIL) 12.5 MG tablet 1 po QD 05/18/18  Yes Shade FloodGreene, Chani Ghanem R, MD  losartan (COZAAR) 25 MG tablet Take 2 tablets (50 mg total) by mouth daily. 05/18/18  Yes Shade FloodGreene, Karia Ehresman R, MD  metoprolol succinate (TOPROL-XL) 25 MG 24 hr tablet Take 1 tablet (25 mg total) by mouth daily. 05/18/18  Yes Shade FloodGreene, Wilho Sharpley R, MD  traMADol (ULTRAM) 50 MG tablet Take 1 tablet (50 mg total) by mouth every 8 (eight) hours as needed. 04/15/17  Yes Peyton NajjarHopper, David H, MD   Social History   Socioeconomic History  . Marital status: Single    Spouse name: Not on file  . Number of children: Not on file  . Years of education: Not on file  . Highest education level: Not on file  Occupational History  . Not on file  Social Needs  . Financial resource strain: Not on file  . Food insecurity    Worry: Not on file    Inability: Not on file  . Transportation needs    Medical: Not on file    Non-medical: Not on file  Tobacco Use  . Smoking status: Never Smoker  . Smokeless tobacco: Never Used  Substance and Sexual Activity  . Alcohol use: Yes    Comment: social  . Drug use: No  . Sexual activity: Yes    Birth control/protection:  Post-menopausal  Lifestyle  . Physical activity    Days per week: Not on file    Minutes per session: Not on file  . Stress: Not on file  Relationships  . Social Musicianconnections    Talks on phone: Not on file    Gets together: Not on file    Attends religious service: Not on file    Active member of club or organization: Not on file    Attends meetings of clubs or organizations: Not  on file    Relationship status: Not on file  . Intimate partner violence    Fear of current or ex partner: Not on file    Emotionally abused: Not on file    Physically abused: Not on file    Forced sexual activity: Not on file  Other Topics Concern  . Not on file  Social History Narrative   Lives at home alone    Review of Systems 13 point review of systems per patient health survey noted.  Negative other than as indicated above or in HPI.      Objective:   Physical Exam Constitutional:      Appearance: She is well-developed.  HENT:     Head: Normocephalic and atraumatic.     Right Ear: External ear normal.     Left Ear: External ear normal.  Eyes:     Conjunctiva/sclera: Conjunctivae normal.     Pupils: Pupils are equal, round, and reactive to light.  Neck:     Musculoskeletal: Normal range of motion and neck supple.     Thyroid: No thyromegaly.  Cardiovascular:     Rate and Rhythm: Normal rate and regular rhythm.     Heart sounds: Normal heart sounds. No murmur.  Pulmonary:     Effort: Pulmonary effort is normal. No respiratory distress.     Breath sounds: Normal breath sounds. No wheezing.  Abdominal:     General: Bowel sounds are normal.     Palpations: Abdomen is soft.     Tenderness: There is no abdominal tenderness.  Musculoskeletal: Normal range of motion.        General: No tenderness.     Left shoulder: She exhibits normal range of motion, no tenderness, no deformity, no pain (Negative Neer, Hawkins, empty can, O'Briens), no spasm and normal strength (full rtc strength. ).      Cervical back: She exhibits no pain (pain free rom, no shoulder pain w/ exam. ).     Comments: Positive tinel on R  Only.  phalen positive bilaterally.   Lymphadenopathy:     Cervical: No cervical adenopathy.  Skin:    General: Skin is warm and dry.     Findings: No rash.  Neurological:     Mental Status: She is alert and oriented to person, place, and time.  Psychiatric:        Behavior: Behavior normal.        Thought Content: Thought content normal.    Vitals:   11/19/18 1047 11/19/18 1050  BP: (!) 152/84 (!) 149/66  Pulse: 79   Resp: 14   Temp: 98.5 F (36.9 C)   TempSrc: Oral   SpO2: 97%   Weight: (!) 330 lb 4.8 oz (149.8 kg)       Assessment & Plan:   Lesly DukesKaren R Cassatt is a 58 y.o. female Annual physical exam  - -anticipatory guidance as below in AVS, screening labs above. Health maintenance items as above in HPI discussed/recommended as applicable.   Need for prophylactic vaccination and inoculation against influenza - Plan: Flu Vaccine QUAD 36+ mos IM  Screening for thyroid disorder - Plan: TSH  Essential hypertension - Plan: Lipid Panel, Comprehensive metabolic panel  -Monitor home readings with repeat eval next few weeks.  Importance of follow-up with sleep specialist for possible untreated OSA discussed.  Screening for hyperlipidemia - Plan: Lipid Panel  Special screening for malignant neoplasms, colon - Plan: Cologuard  -Colonoscopy versus Cologuard discussed, including testing intervals and potential need  for diagnostic colonoscopy if positive Cologuard screening.  Chose Cologuard, order placed.   Screening for breast cancer - Plan: MM Digital Screening ordered.   Screening for cervical cancer - Plan: Ambulatory referral to Gynecology History of endometriosis - Plan: Ambulatory referral to Gynecology  -Pap testing discussed but preferred to have performed at gynecology, referral placed.   OSA (obstructive sleep apnea)  -Although overall testing mild OSA,  severe symptoms reported supine or during REM.  CPAP recommendation was discussed, as well as potential impact on blood pressure as above.   Need for shingles vaccine - Plan: Zoster Vaccine Adjuvanted [facility]) injection  Bilateral carpal tunnel syndrome - Plan: Ambulatory referral to Hand Surgery  - R greater than left.  Difficulty with bracing due to sizing of braces.  Refer to hand surgery to discuss treatment options.  DOE (dyspnea on exertion)  -Possible deconditioning, but recommended follow up with cardiology as prior eval for similar symptoms.   Acute pain of left shoulder  - reassuring exam at present. rtc precautions if persistent or worsening.   Meds ordered this encounter  Medications  . Zoster Vaccine Adjuvanted [facility]) injection    Sig: Inject 0.5 mLs into the muscle once for 1 dose. Repeat in 2-6 months.    Dispense:  0.5 mL    Refill:  1   Patient Instructions   Please call sleep specialist as soon as possible to discuss treatment for sleep apnea as that may be affecting blood pressure and headaches.    I do recommend dentist evaluation. Here is an option if other office needed: [facility]  Address: [address] [place], [place], [place] [address]  [email or web address]  Phone: [phone]  Call cardiology for follow up of shortness of breath. Return to the clinic or go to the nearest emergency room if any of your symptoms worsen or new symptoms occur.  Please follow up in next 2 weeks to review labs, home blood pressure readings (check once per day) and follow up other concerns from today.   I will refer you to hand specialist for possible carpal tunnel symptoms.   I will order Cologuard for colon cancer screening, ordered mammogram for breast cancer screening and refer you to gynecology for cervical cancer screening.  Shingles vaccine sent to pharmacy.  Return to the clinic or go to the nearest emergency room if any of your symptoms worsen or new  symptoms occur.  Keeping You Healthy  Get These Tests  Blood Pressure- Have your blood pressure checked by your healthcare provider at least once a year.  Normal blood pressure is 120/80.  Weight- Have your body mass index (BMI) calculated to screen for obesity.  BMI is a measure of body fat based on height and weight.  You can calculate your own BMI at https://www.Dubs-esparza.com/  Cholesterol- Have your cholesterol checked every year.  Diabetes- Have your blood sugar checked every year if you have high blood pressure, high cholesterol, a family history of diabetes or if you are overweight.  Pap Test - Have a pap test every 1 to 5 years if you have been sexually active.  If you are older than 65 and recent pap tests have been normal you may not need additional pap tests.  In addition, if you have had a hysterectomy  for benign disease additional pap tests are not necessary.  Mammogram-Yearly mammograms are essential for early detection of breast cancer  Screening for Colon Cancer- Colonoscopy starting at age 60. Screening may begin sooner depending  on your family history and other health conditions.  Follow up colonoscopy as directed by your Gastroenterologist.  Screening for Osteoporosis- Screening begins at age 9 with bone density scanning, sooner if you are at higher risk for developing Osteoporosis.  Get these medicines  Calcium with Vitamin D- Your body requires 1200-1500 mg of Calcium a day and [phone] IU of Vitamin D a day.  You can only absorb 500 mg of Calcium at a time therefore Calcium must be taken in 2 or 3 separate doses throughout the day.  Hormones- Hormone therapy has been associated with increased risk for certain cancers and heart disease.  Talk to your healthcare provider about if you need relief from menopausal symptoms.  Aspirin- Ask your healthcare provider about taking Aspirin to prevent Heart Disease and Stroke.  Get these Immuniztions  Flu shot- Every fall   Pneumonia shot- Once after the age of 1; if you are younger ask your healthcare provider if you need a pneumonia shot.  Tetanus- Every ten years.  Zostavax- Once after the age of 74 to prevent shingles.  Take these steps  Don't smoke- Your healthcare provider can help you quit. For tips on how to quit, ask your healthcare provider or go to www.smokefree.gov or call 1-800 QUIT-NOW.  Be physically active- Exercise 5 days a week for a minimum of 30 minutes.  If you are not already physically active, start slow and gradually work up to 30 minutes of moderate physical activity.  Try walking, dancing, bike riding, swimming, etc.  Eat a healthy diet- Eat a variety of healthy foods such as fruits, vegetables, whole grains, low fat milk, low fat cheeses, yogurt, lean meats, chicken, fish, eggs, dried beans, tofu, etc.  For more information go to www.thenutritionsource.org  Dental visit- Brush and floss teeth twice daily; visit your dentist twice a year.  Eye exam- Visit your Optometrist or Ophthalmologist yearly.  Drink alcohol in moderation- Limit alcohol intake to one drink or less a day.  Never drink and drive.  Depression- Your emotional health is as important as your physical health.  If you're feeling down or losing interest in things you normally enjoy, please talk to your healthcare provider.  Seat Belts- can save your life; always wear one  Smoke/Carbon Monoxide detectors- These detectors need to be installed on the appropriate level of your home.  Replace batteries at least once a year.  Violence- If anyone is threatening or hurting you, please tell your healthcare provider.  Living Will/ Health care power of attorney- Discuss with your healthcare provider and family.     If you have lab work done today you will be contacted with your lab results within the next 2 weeks.  If you have not heard from [place] then please contact us. The fastest way to get your results is to register for My  Chart.   IF you received an x-ray today, you will receive an invoice from [facility]. Please contact [facility] at [phone] with questions or concerns regarding your invoice.   IF you received labwork today, you will receive an invoice from [place]. Please contact [facility] at [phone] with questions or concerns regarding your invoice.   Our billing staff will not be able to assist you with questions regarding bills from these companies.  You will be contacted with the lab results as soon as they are available. The fastest way to get your results is to activate your My Chart account. Instructions are located on the last  page of this paperwork. If you have not heard from us regarding the results in 2 weeks, please contact this office.       Signed,   Meredith StaggersJeffrey Caileen Veracruz, MD Primary [facility] at [facility].  11/19/18 10:20 PM

## 2018-11-19 NOTE — Patient Instructions (Addendum)
Please call sleep specialist as soon as possible to discuss treatment for sleep apnea as that may be affecting blood pressure and headaches.    I do recommend dentist evaluation. Here is an option if other office needed: [facility]  Address: [place], [place], [place] [address]  [email or web address]  Phone: [phone]  Call cardiology for follow up of shortness of breath. Return to the clinic or go to the nearest emergency room if any of your symptoms worsen or new symptoms occur.  Please follow up in next 2 weeks to review labs, home blood pressure readings (check once per day) and follow up other concerns from today.   I will refer you to hand specialist for possible carpal tunnel symptoms.   I will order Cologuard for colon cancer screening, ordered mammogram for breast cancer screening and refer you to gynecology for cervical cancer screening.  Shingles vaccine sent to pharmacy.  Return to the clinic or go to the nearest emergency room if any of your symptoms worsen or new symptoms occur.  Keeping You Healthy  Get These Tests  Blood Pressure- Have your blood pressure checked by your healthcare provider at least once a year.  Normal blood pressure is 120/80.  Weight- Have your body mass index (BMI) calculated to screen for obesity.  BMI is a measure of body fat based on height and weight.  You can calculate your own BMI at GravelBags.it  Cholesterol- Have your cholesterol checked every year.  Diabetes- Have your blood sugar checked every year if you have high blood pressure, high cholesterol, a family history of diabetes or if you are overweight.  Pap Test - Have a pap test every 1 to 5 years if you have been sexually active.  If you are older than 65 and recent pap tests have been normal you may not need additional pap tests.  In addition, if you have had a hysterectomy  for benign disease additional pap tests are not necessary.  Mammogram-Yearly  mammograms are essential for early detection of breast cancer  Screening for Colon Cancer- Colonoscopy starting at age 74. Screening may begin sooner depending on your family history and other health conditions.  Follow up colonoscopy as directed by your Gastroenterologist.  Screening for Osteoporosis- Screening begins at age 77 with bone density scanning, sooner if you are at higher risk for developing Osteoporosis.  Get these medicines  Calcium with Vitamin D- Your body requires 1200-1500 mg of Calcium a day and [phone] IU of Vitamin D a day.  You can only absorb 500 mg of Calcium at a time therefore Calcium must be taken in 2 or 3 separate doses throughout the day.  Hormones- Hormone therapy has been associated with increased risk for certain cancers and heart disease.  Talk to your healthcare provider about if you need relief from menopausal symptoms.  Aspirin- Ask your healthcare provider about taking Aspirin to prevent Heart Disease and Stroke.  Get these Immuniztions  Flu shot- Every fall  Pneumonia shot- Once after the age of 72; if you are younger ask your healthcare provider if you need a pneumonia shot.  Tetanus- Every ten years.  Zostavax- Once after the age of 85 to prevent shingles.  Take these steps  Don't smoke- Your healthcare provider can help you quit. For tips on how to quit, ask your healthcare provider or go to www.smokefree.gov or call 1-800 QUIT-NOW.  Be physically active- Exercise 5 days a week for a minimum of 30 minutes.  If you are not already physically active, start slow and gradually work up to 30 minutes of moderate physical activity.  Try walking, dancing, bike riding, swimming, etc.  Eat a healthy diet- Eat a variety of healthy foods such as fruits, vegetables, whole grains, low fat milk, low fat cheeses, yogurt, lean meats, chicken, fish, eggs, dried beans, tofu, etc.  For more information go to www.thenutritionsource.org  Dental visit- Brush and  floss teeth twice daily; visit your dentist twice a year.  Eye exam- Visit your Optometrist or Ophthalmologist yearly.  Drink alcohol in moderation- Limit alcohol intake to one drink or less a day.  Never drink and drive.  Depression- Your emotional health is as important as your physical health.  If you're feeling down or losing interest in things you normally enjoy, please talk to your healthcare provider.  Seat Belts- can save your life; always wear one  Smoke/Carbon Monoxide detectors- These detectors need to be installed on the appropriate level of your home.  Replace batteries at least once a year.  Violence- If anyone is threatening or hurting you, please tell your healthcare provider.  Living Will/ Health care power of attorney- Discuss with your healthcare provider and family.     If you have lab work done today you will be contacted with your lab results within the next 2 weeks.  If you have not heard from us then please contact us. The fastest way to get your results is to register for My Chart.   IF you received an x-ray today, you will receive an invoice from [facility]. Please contact [facility] at [phone] with questions or concerns regarding your invoice.   IF you received labwork today, you will receive an invoice from [facility]. Please contact [facility] at [phone] with questions or concerns regarding your invoice.   Our billing staff will not be able to assist you with questions regarding bills from these companies.  You will be contacted with the lab results as soon as they are available. The fastest way to get your results is to activate your My Chart account. Instructions are located on the last page of this paperwork. If you have not heard from us regarding the results in 2 weeks, please contact this office.

## 2018-11-20 LAB — LIPID PANEL
Chol/HDL Ratio: 3.9 ratio (ref 0.0–4.4)
Cholesterol, Total: 221 mg/dL — ABNORMAL HIGH (ref 100–199)
HDL: 56 mg/dL (ref >39)
LDL Calculated: 149 mg/dL — ABNORMAL HIGH (ref 0–99)
Triglycerides: 79 mg/dL (ref 0–149)
VLDL Cholesterol Cal: 16 mg/dL (ref 5–40)

## 2018-11-20 LAB — COMPREHENSIVE METABOLIC PANEL
ALT: 7 IU/L (ref 0–32)
AST: 12 IU/L (ref 0–40)
Albumin/Globulin Ratio: 1.7 (ref 1.2–2.2)
Albumin: 4.4 g/dL (ref 3.8–4.9)
Alkaline Phosphatase: 82 IU/L (ref 39–117)
BUN/Creatinine Ratio: 14 (ref 12–28)
BUN: 12 mg/dL (ref 8–27)
Bilirubin Total: 0.3 mg/dL (ref 0.0–1.2)
CO2: 23 mmol/L (ref 20–29)
Calcium: 9.7 mg/dL (ref 8.7–10.3)
Chloride: 103 mmol/L (ref 96–106)
Creatinine, Ser: 0.86 mg/dL (ref 0.57–1.00)
GFR calc Af Amer: 85 mL/min/{1.73_m2} (ref >59)
GFR calc non Af Amer: 74 mL/min/{1.73_m2} (ref >59)
Globulin, Total: 2.6 g/dL (ref 1.5–4.5)
Glucose: 93 mg/dL (ref 65–99)
Potassium: 4.5 mmol/L (ref 3.5–5.2)
Sodium: 142 mmol/L (ref 134–144)
Total Protein: 7 g/dL (ref 6.0–8.5)

## 2018-11-20 LAB — TSH: TSH: 1.02 u[IU]/mL (ref 0.450–4.500)

## 2018-11-21 ENCOUNTER — Other Ambulatory Visit: Payer: Self-pay | Admitting: Family Medicine

## 2018-11-21 DIAGNOSIS — I10 Essential (primary) hypertension: Secondary | ICD-10-CM

## 2018-12-03 ENCOUNTER — Ambulatory Visit: Payer: BC Managed Care – PPO | Admitting: Family Medicine

## 2018-12-03 ENCOUNTER — Other Ambulatory Visit: Payer: Self-pay

## 2018-12-03 ENCOUNTER — Encounter: Payer: Self-pay | Admitting: Family Medicine

## 2018-12-03 VITALS — BP 123/72 | HR 79 | Temp 98.6°F | Resp 14 | Wt 330.8 lb

## 2018-12-03 DIAGNOSIS — I10 Essential (primary) hypertension: Secondary | ICD-10-CM

## 2018-12-03 DIAGNOSIS — E785 Hyperlipidemia, unspecified: Secondary | ICD-10-CM | POA: Diagnosis not present

## 2018-12-03 NOTE — Patient Instructions (Addendum)
Watch diet, activity/exercise for cholesterol and recheck in 6 months.   Blood pressure better today - no changes.    If you have lab work done today you will be contacted with your lab results within the next 2 weeks.  If you have not heard from [place] then please contact us. The fastest way to get your results is to register for My Chart.   IF you received an x-ray today, you will receive an invoice from [facility]. Please contact [facility] at [phone] with questions or concerns regarding your invoice.   IF you received labwork today, you will receive an invoice from [place]. Please contact [facility] at [phone] with questions or concerns regarding your invoice.   Our billing staff will not be able to assist you with questions regarding bills from these companies.  You will be contacted with the lab results as soon as they are available. The fastest way to get your results is to activate your My Chart account. Instructions are located on the last page of this paperwork. If you have not heard from [place] regarding the results in 2 weeks, please contact this office.

## 2018-12-03 NOTE — Progress Notes (Signed)
Subjective:    Patient ID: Mackenzie Everett, female    DOB: January 24, 1961, 58 y.o.   MRN: [id number]  HPI Mackenzie Everett is a 58 y.o. female Presents today for: Chief Complaint  Patient presents with  . Hypertension     2 wk f/u on bp and other concern at last visit. Bp at home has been ruuning 122/76.    Mackenzie DukesKaren R Caraway is a 58 y.o. female Presents today for: Chief Complaint  Patient presents with  . Hypertension     2 wk f/u on bp and other concern at last visit. Bp at home has been ruuning 122/76.   Hypertension: BP Readings from Last 3 Encounters:  12/03/18 123/72  11/19/18 (!) 149/66  05/18/18 139/79   Lab Results  Component Value Date   CREATININE 0.86 11/19/2018  home readings 122-123/74-79. No elevated readings.  No new side effects with meds. Follow-up from August 20 visit.  Elevated readings at that time.  Recommended monitoring home readings on same regimen.  See last visit regarding previous sleep apnea testing, recommended CPAP previously due to severe symptoms supine or during REM.  Still planning on following up with sleep specialist. Received call from hand specialist - will be calling to schedule.  No dyspnea since last visit. Felt ok with walking at mall recently.   Hyperlipidemia:  Lab Results  Component Value Date   CHOL 221 (H) 11/19/2018   HDL 56 11/19/2018   LDLCALC 149 (H) 11/19/2018   TRIG 79 11/19/2018   CHOLHDL 3.9 11/19/2018   Lab Results  Component Value Date   ALT 7 11/19/2018   AST 12 11/19/2018   ALKPHOS 82 11/19/2018   BILITOT 0.3 11/19/2018  no current statin The 10-year ASCVD risk score Denman George(Goff DC Jr., et al., 2013) is: 6.6%   Values used to calculate the score:     Age: [age] years     Sex: Female     Is Non-Hispanic African American: Yes     Diabetic: No     Tobacco smoker: No     Systolic Blood Pressure: 123 mmHg     Is BP treated: Yes     HDL Cholesterol: 56 mg/dL     Total Cholesterol: 221 mg/dL  Review of Systems Per HPI.     Objective:   Physical Exam Constitutional:      General: She is not in acute distress.    Appearance: She is well-developed.  HENT:     Head: Normocephalic and atraumatic.  Cardiovascular:     Rate and Rhythm: Normal rate.  Pulmonary:     Effort: Pulmonary effort is normal.  Neurological:     Mental Status: She is alert and oriented to person, place, and time.    Vitals:   12/03/18 1506  BP: 123/72  Pulse: 79  Resp: 14  Temp: 98.6 F (37 C)  TempSrc: Oral  SpO2: 99%  Weight: (!) 330 lb 12.8 oz (150 kg)       Assessment & Plan:  Mackenzie DukesKaren R Kugler is a 58 y.o. female Essential hypertension  -  Stable on repeat testing and home regimen, tolerating current regimen. No changes - recheck 6 months   Hyperlipidemia, unspecified hyperlipidemia type  -Previous testing reviewed, low 10-year ASCVD risk score, hold on meds, diet/exercise approach for weight loss initially with recheck 6 months  No orders of the defined types were placed in this encounter.  Patient Instructions   Watch diet, activity/exercise for cholesterol and  recheck in 6 months.   Blood pressure better today - no changes.    If you have lab work done today you will be contacted with your lab results within the next 2 weeks.  If you have not heard from [place] then please contact us. The fastest way to get your results is to register for My Chart.   IF you received an x-ray today, you will receive an invoice from [facility]. Please contact [facility] at [phone] with questions or concerns regarding your invoice.   IF you received labwork today, you will receive an invoice from [facility]. Please contact [facility] at [phone] with questions or concerns regarding your invoice.   Our billing staff will not be able to assist you with questions regarding bills from these companies.  You will be contacted with the lab results as soon as they are available. The fastest way to get your results is  to activate your My Chart account. Instructions are located on the last page of this paperwork. If you have not heard from [place] regarding the results in 2 weeks, please contact this office.       Signed,   Merri Ray, MD [facility].  12/03/18 8:04 PM

## 2018-12-20 ENCOUNTER — Other Ambulatory Visit: Payer: Self-pay | Admitting: Family Medicine

## 2018-12-20 DIAGNOSIS — I10 Essential (primary) hypertension: Secondary | ICD-10-CM

## 2018-12-24 ENCOUNTER — Other Ambulatory Visit: Payer: Self-pay | Admitting: Family Medicine

## 2018-12-24 DIAGNOSIS — I10 Essential (primary) hypertension: Secondary | ICD-10-CM

## 2018-12-31 ENCOUNTER — Encounter: Payer: Self-pay | Admitting: Obstetrics and Gynecology

## 2018-12-31 ENCOUNTER — Other Ambulatory Visit: Payer: Self-pay

## 2018-12-31 ENCOUNTER — Ambulatory Visit: Payer: BC Managed Care – PPO | Admitting: Obstetrics and Gynecology

## 2018-12-31 VITALS — BP 133/73 | HR 67 | Ht 70.0 in | Wt 333.6 lb

## 2018-12-31 DIAGNOSIS — Z1151 Encounter for screening for human papillomavirus (HPV): Secondary | ICD-10-CM

## 2018-12-31 DIAGNOSIS — Z124 Encounter for screening for malignant neoplasm of cervix: Secondary | ICD-10-CM | POA: Diagnosis not present

## 2018-12-31 NOTE — Progress Notes (Signed)
   GYNECOLOGY OFFICE FOLLOW UP NOTE  History:  58 y.o. G4P4 here today for follow up for pap smear. No issues. H/o ablation with no bleeding since. No pap since 2014. No h/o abnormal pap. Overall, she is doing very well.    Past Medical History:  Diagnosis Date  . Allergy   . Arthritis    hands and knees  . Asthma   . Hyperlipidemia 09/04/2011  . Hypertension     Past Surgical History:  Procedure Laterality Date  . APPENDECTOMY    . BREAST SURGERY    . KNEE SURGERY     1994  . TUBAL LIGATION       Current Outpatient Medications:  .  desloratadine (CLARINEX) 5 MG tablet, TAKE 1 TABLET(5 MG) BY MOUTH DAILY, Disp: 90 tablet, Rfl: 0 .  hydrochlorothiazide (HYDRODIURIL) 12.5 MG tablet, TAKE 1 TABLET BY MOUTH EVERY DAY, Disp: 90 tablet, Rfl: 1 .  losartan (COZAAR) 25 MG tablet, TAKE 2 TABLETS BY MOUTH EVERY DAY, Disp: 180 tablet, Rfl: 1 .  metoprolol succinate (TOPROL-XL) 25 MG 24 hr tablet, Take 1 tablet (25 mg total) by mouth daily., Disp: 90 tablet, Rfl: 1 .  traMADol (ULTRAM) 50 MG tablet, Take 1 tablet (50 mg total) by mouth every 8 (eight) hours as needed., Disp: 20 tablet, Rfl: 0  The following portions of the patient's history were reviewed and updated as appropriate: allergies, current medications, past family history, past medical history, past social history, past surgical history and problem list.   Review of Systems:  Pertinent items noted in HPI and remainder of comprehensive ROS otherwise negative.   Objective:  Physical Exam BP 133/73   Pulse 67   Ht 5\' 10"  (1.778 m)   Wt (!) 333 lb 9.6 oz (151.3 kg)   BMI 47.87 kg/m  CONSTITUTIONAL: Well-developed, well-nourished female in no acute distress.  HENT:  Normocephalic, atraumatic. External right and left ear normal. Oropharynx is clear and moist EYES: Conjunctivae and EOM are normal. Pupils are equal, round, and reactive to light. No scleral icterus.  NECK: Normal range of motion, supple, no masses SKIN: Skin is  warm and dry. No rash noted. Not diaphoretic. No erythema. No pallor. NEUROLOGIC: Alert and oriented to person, place, and time. Normal reflexes, muscle tone coordination. No cranial nerve deficit noted. PSYCHIATRIC: Normal mood and affect. Normal behavior. Normal judgment and thought content. CARDIOVASCULAR: Normal heart rate noted RESPIRATORY: Effort normal, no problems with respiration noted ABDOMEN: Soft, no distention noted.   PELVIC: Normal appearing external genitalia; normal appearing vaginal mucosa and cervix.  No abnormal discharge noted.  Pap smear obtained.  Normal uterine size, no other palpable masses, no uterine or adnexal tenderness. MUSCULOSKELETAL: Normal range of motion. No edema noted.  Exam done with chaperone present.  Labs and Imaging No results found.  Assessment & Plan:   1. Cervical cancer screening - Cytology - PAP( [place])   Mammogram ordered Colonoscopy UTD Flu UTD  Dexa starting age 40   Routine preventative health maintenance measures emphasized. Please refer to After Visit Summary for other counseling recommendations.   Return in about 1 year (around 12/31/2019), or if symptoms worsen or fail to improve.  Total face-to-face time with patient: 20 minutes. Over 50% of encounter was spent on counseling and coordination of care.  Feliz Beam, M.D. Attending [facility] for [employer] [facility] farm manager)

## 2019-01-06 LAB — CYTOLOGY - PAP
Diagnosis: NEGATIVE
High risk HPV: NEGATIVE

## 2019-01-23 ENCOUNTER — Other Ambulatory Visit: Payer: Self-pay | Admitting: Family Medicine

## 2019-01-23 DIAGNOSIS — I10 Essential (primary) hypertension: Secondary | ICD-10-CM

## 2019-03-23 ENCOUNTER — Other Ambulatory Visit: Payer: Self-pay | Admitting: Family Medicine

## 2019-03-23 DIAGNOSIS — I10 Essential (primary) hypertension: Secondary | ICD-10-CM

## 2019-03-25 ENCOUNTER — Ambulatory Visit: Admit: 2019-03-25 | Payer: Self-pay | Primary: Physician

## 2019-03-25 DIAGNOSIS — Z23 Encounter for immunization: Secondary | ICD-10-CM

## 2019-04-05 ENCOUNTER — Other Ambulatory Visit: Payer: Self-pay

## 2019-04-05 ENCOUNTER — Encounter: Payer: Self-pay | Admitting: Registered Nurse

## 2019-04-05 ENCOUNTER — Ambulatory Visit (INDEPENDENT_AMBULATORY_CARE_PROVIDER_SITE_OTHER): Payer: BC Managed Care – PPO | Admitting: Registered Nurse

## 2019-04-05 VITALS — BP 132/83 | HR 64 | Temp 97.3°F | Resp 17 | Ht 70.0 in | Wt 330.8 lb

## 2019-04-05 DIAGNOSIS — R102 Pelvic and perineal pain: Secondary | ICD-10-CM | POA: Diagnosis not present

## 2019-04-05 DIAGNOSIS — R109 Unspecified abdominal pain: Secondary | ICD-10-CM | POA: Diagnosis not present

## 2019-04-05 LAB — POCT URINALYSIS DIP (MANUAL ENTRY)
Bilirubin, UA: NEGATIVE
Blood, UA: NEGATIVE
Glucose, UA: NEGATIVE mg/dL
Ketones, POC UA: NEGATIVE mg/dL
Leukocytes, UA: NEGATIVE
Nitrite, UA: NEGATIVE
Protein Ur, POC: NEGATIVE mg/dL
Spec Grav, UA: 1.025 (ref 1.010–1.025)
Urobilinogen, UA: 0.2 E.U./dL
pH, UA: 5 (ref 5.0–8.0)

## 2019-04-05 MED ORDER — PHENAZOPYRIDINE HCL 95 MG PO TABS: 95.0000 mg | ORAL_TABLET | Freq: Three times a day (TID) | ORAL | 0 refills | Status: DC | PRN

## 2019-04-05 MED ORDER — NITROFURANTOIN MONOHYD MACRO 100 MG PO CAPS: 100.0000 mg | ORAL_CAPSULE | Freq: Two times a day (BID) | ORAL | 0 refills | Status: AC

## 2019-04-05 NOTE — Patient Instructions (Signed)
° ° ° °  If you have lab work done today you will be contacted with your lab results within the next 2 weeks.  If you have not heard from us then please contact us. The fastest way to get your results is to register for My Chart. ° ° °IF you received an x-ray today, you will receive an invoice from [facility]. Please contact [facility] at [phone] with questions or concerns regarding your invoice.  ° °IF you received labwork today, you will receive an invoice from [facility]. Please contact [facility] at [phone] with questions or concerns regarding your invoice.  ° °Our billing staff will not be able to assist you with questions regarding bills from these companies. ° °You will be contacted with the lab results as soon as they are available. The fastest way to get your results is to activate your My Chart account. Instructions are located on the last page of this paperwork. If you have not heard from us regarding the results in 2 weeks, please contact this office. °  ° ° ° °

## 2019-04-05 NOTE — Progress Notes (Signed)
Acute Office Visit  Subjective:    Patient ID: Mackenzie Everett, female    DOB: 1961-01-08, 59 y.o.   MRN: [id number]  Chief Complaint  Patient presents with  . Back Pain    lower back since yesterday patient stated she took ibuprofen but it did not help.  Marland Kitchen Urinary Frequency    pt states that she use the bathroom frequently and has a strong smell . pt unable to void at the moment    HPI Patient is in today for urinary odor and R flank pain Urinary symptoms onset a few days ago, flank pain yesterday. Slow onset, worse now. Remote history of UTI. No vaginal symptoms. No concern for STI exposure. Denies gross hematuria. Denies fever, chills, malaise, systemic symptoms.  Past Medical History:  Diagnosis Date  . Allergy   . Arthritis    hands and knees  . Asthma   . Hyperlipidemia 09/04/2011  . Hypertension     Past Surgical History:  Procedure Laterality Date  . APPENDECTOMY    . BREAST SURGERY    . KNEE SURGERY     1994  . TUBAL LIGATION      Family History  Problem Relation Age of Onset  . Arthritis Mother   . Diabetes Mother   . Hyperlipidemia Mother   . Hypertension Mother   . Hyperlipidemia Sister   . Hypertension Sister   . Lupus Sister   . Diabetes Maternal Grandmother   . Heart disease Maternal Grandfather   . Heart disease Paternal Grandfather   . Cancer Maternal Aunt     Social History   Socioeconomic History  . Marital status: Single    Spouse name: Not on file  . Number of children: Not on file  . Years of education: Not on file  . Highest education level: Not on file  Occupational History  . Not on file  Tobacco Use  . Smoking status: Never Smoker  . Smokeless tobacco: Never Used  Substance and Sexual Activity  . Alcohol use: Yes    Comment: social  . Drug use: No  . Sexual activity: Yes    Birth control/protection: Post-menopausal  Other Topics Concern  . Not on file  Social History Narrative   Lives at home alone   Social  Determinants of Health   Financial Resource Strain:   . Difficulty of Paying Living Expenses: Not on file  Food Insecurity:   . Worried About Programme researcher, broadcasting/film/video in the Last Year: Not on file  . Ran Out of Food in the Last Year: Not on file  Transportation Needs:   . Lack of Transportation (Medical): Not on file  . Lack of Transportation (Non-Medical): Not on file  Physical Activity:   . Days of Exercise per Week: Not on file  . Minutes of Exercise per Session: Not on file  Stress:   . Feeling of Stress : Not on file  Social Connections:   . Frequency of Communication with Friends and Family: Not on file  . Frequency of Social Gatherings with Friends and Family: Not on file  . Attends Religious Services: Not on file  . Active Member of Clubs or Organizations: Not on file  . Attends Banker Meetings: Not on file  . Marital Status: Not on file  Intimate Partner Violence:   . Fear of Current or Ex-Partner: Not on file  . Emotionally Abused: Not on file  . Physically Abused: Not on file  .  Sexually Abused: Not on file    Outpatient Medications Prior to Visit  Medication Sig Dispense Refill  . hydrochlorothiazide (HYDRODIURIL) 12.5 MG tablet TAKE 1 TABLET BY MOUTH EVERY DAY 90 tablet 1  . losartan (COZAAR) 25 MG tablet TAKE 2 TABLETS BY MOUTH EVERY DAY 180 tablet 1  . metoprolol succinate (TOPROL-XL) 25 MG 24 hr tablet TAKE 1 TABLET BY MOUTH EVERY DAY 90 tablet 1  . traMADol (ULTRAM) 50 MG tablet Take 1 tablet (50 mg total) by mouth every 8 (eight) hours as needed. 20 tablet 0  . desloratadine (CLARINEX) 5 MG tablet TAKE 1 TABLET(5 MG) BY MOUTH DAILY (Patient not taking: Reported on 04/05/2019) 90 tablet 0   No facility-administered medications prior to visit.    Allergies  Allergen Reactions  . Caffeine Other (See Comments)    migraines  . Penicillins Hives    Review of Systems  Constitutional: Negative.   HENT: Negative.   Eyes: Negative.   Respiratory:  Negative.   Cardiovascular: Negative.   Gastrointestinal: Negative.   Endocrine: Negative.   Genitourinary: Positive for flank pain and frequency. Negative for decreased urine volume, difficulty urinating, dyspareunia, dysuria, enuresis, genital sores, hematuria, menstrual problem, pelvic pain, urgency, vaginal bleeding, vaginal discharge and vaginal pain.  Skin: Negative.   Allergic/Immunologic: Negative.   Neurological: Negative.   Hematological: Negative.   Psychiatric/Behavioral: Negative.   All other systems reviewed and are negative.      Objective:    Physical Exam Vitals and nursing note reviewed.  Constitutional:      General: She is not in acute distress.    Appearance: Normal appearance. She is normal weight. She is not ill-appearing, toxic-appearing or diaphoretic.  Cardiovascular:     Rate and Rhythm: Normal rate and regular rhythm.  Pulmonary:     Effort: Pulmonary effort is normal. No respiratory distress.  Neurological:     General: No focal deficit present.     Mental Status: She is alert and oriented to person, place, and time. Mental status is at baseline.  Psychiatric:        Mood and Affect: Mood normal.        Behavior: Behavior normal.        Thought Content: Thought content normal.        Judgment: Judgment normal.     BP 132/83   Pulse 64   Temp (!) 97.3 F (36.3 C) (Temporal)   Resp 17   Ht 5\' 10"  (1.778 m)   Wt (!) 330 lb 12.8 oz (150 kg)   SpO2 99%   BMI 47.46 kg/m  Wt Readings from Last 3 Encounters:  04/05/19 (!) 330 lb 12.8 oz (150 kg)  12/31/18 (!) 333 lb 9.6 oz (151.3 kg)  12/03/18 (!) 330 lb 12.8 oz (150 kg)    Health Maintenance Due  Topic Date Due  . MAMMOGRAM  03/04/2015    There are no preventive care reminders to display for this patient.   Lab Results  Component Value Date   TSH 1.020 11/19/2018   Lab Results  Component Value Date   WBC 8.4 07/07/2017   HGB 11.8 07/07/2017   HCT 36.5 07/07/2017   MCV 78 (L)  07/07/2017   PLT 288 07/07/2017   Lab Results  Component Value Date   NA 142 11/19/2018   K 4.5 11/19/2018   CO2 23 11/19/2018   GLUCOSE 93 11/19/2018   BUN 12 11/19/2018   CREATININE 0.86 11/19/2018   BILITOT 0.3 11/19/2018  ALKPHOS 82 11/19/2018   AST 12 11/19/2018   ALT 7 11/19/2018   PROT 7.0 11/19/2018   ALBUMIN 4.4 11/19/2018   CALCIUM 9.7 11/19/2018   ANIONGAP 14 06/11/2015   Lab Results  Component Value Date   CHOL 221 (H) 11/19/2018   Lab Results  Component Value Date   HDL 56 11/19/2018   Lab Results  Component Value Date   LDLCALC 149 (H) 11/19/2018   Lab Results  Component Value Date   TRIG 79 11/19/2018   Lab Results  Component Value Date   CHOLHDL 3.9 11/19/2018   Lab Results  Component Value Date   HGBA1C 6.1 (H) 12/20/2014       Assessment & Plan:   Problem List Items Addressed This Visit    None    Visit Diagnoses    Suprapubic pain    -  Primary   Relevant Medications   nitrofurantoin, macrocrystal-monohydrate, (MACROBID) 100 MG capsule   phenazopyridine (PYRIDIUM) 95 MG tablet   Other Relevant Orders   POCT urinalysis dipstick (Completed)   Urine Culture   Flank pain       Relevant Medications   nitrofurantoin, macrocrystal-monohydrate, (MACROBID) 100 MG capsule   phenazopyridine (PYRIDIUM) 95 MG tablet   Other Relevant Orders   Urine Culture       Meds ordered this encounter  Medications  . nitrofurantoin, macrocrystal-monohydrate, (MACROBID) 100 MG capsule    Sig: Take 1 capsule (100 mg total) by mouth 2 (two) times daily for 5 days.    Dispense:  10 capsule    Refill:  0    Order Specific Question:   Supervising Provider    Answer:   Delia Chimes A [id number]  . phenazopyridine (PYRIDIUM) 95 MG tablet    Sig: Take 1 tablet (95 mg total) by mouth 3 (three) times daily as needed for pain.    Dispense:  10 tablet    Refill:  0    Order Specific Question:   Supervising Provider    Answer:   Forrest Moron  [id number]   PLAN  UA dip onsite wnl  Will culture  tx based on symptoms with macrobid po bid for 5 days, azo tid prn   Return if sxs worsen or fail to improve  Patient encouraged to call clinic with any questions, comments, or concerns.   Maximiano Coss, NP

## 2019-04-07 ENCOUNTER — Encounter: Payer: Self-pay | Admitting: Registered Nurse

## 2019-04-07 NOTE — Progress Notes (Signed)
Urine Culture returned, showing E coli Macrobid given should have good coverage Return to clinic as needed if symptoms worsen or fail to improve  Jari Sportsman, NP

## 2019-04-09 LAB — URINE CULTURE

## 2019-04-15 ENCOUNTER — Ambulatory Visit: Admit: 2019-04-15 | Payer: Self-pay | Primary: Physician

## 2019-04-15 DIAGNOSIS — Z23 Encounter for immunization: Secondary | ICD-10-CM

## 2019-06-01 ENCOUNTER — Telehealth: Payer: Self-pay | Admitting: Family Medicine

## 2019-06-01 NOTE — Telephone Encounter (Signed)
Pt calling she received her Covid shot on 2/26 and has had a head ache since then has an appt with provider tomorrow 06/02/19 at 3. She is wanted to see if she can get medication to relieve it. [phone]. Please advise.

## 2019-06-01 NOTE — Telephone Encounter (Signed)
Left message for pt to call back regarding headache post covid vaccine

## 2019-06-02 ENCOUNTER — Encounter: Payer: Self-pay | Admitting: Family Medicine

## 2019-06-02 ENCOUNTER — Other Ambulatory Visit: Payer: Self-pay

## 2019-06-02 ENCOUNTER — Ambulatory Visit: Payer: BC Managed Care – PPO | Admitting: Family Medicine

## 2019-06-02 VITALS — BP 137/80 | HR 89 | Temp 97.7°F | Ht 70.0 in | Wt 333.0 lb

## 2019-06-02 DIAGNOSIS — Z6841 Body Mass Index (BMI) 40.0 and over, adult: Secondary | ICD-10-CM

## 2019-06-02 DIAGNOSIS — E785 Hyperlipidemia, unspecified: Secondary | ICD-10-CM | POA: Diagnosis not present

## 2019-06-02 DIAGNOSIS — I10 Essential (primary) hypertension: Secondary | ICD-10-CM

## 2019-06-02 DIAGNOSIS — R519 Headache, unspecified: Secondary | ICD-10-CM

## 2019-06-02 DIAGNOSIS — G4733 Obstructive sleep apnea (adult) (pediatric): Secondary | ICD-10-CM

## 2019-06-02 MED ORDER — METOPROLOL SUCCINATE ER 25 MG PO TB24: 25.0000 mg | ORAL_TABLET | Freq: Every day | ORAL | 1 refills | Status: DC

## 2019-06-02 MED ORDER — LOSARTAN POTASSIUM-HCTZ 50-12.5 MG PO TABS: 1.0000 | ORAL_TABLET | Freq: Every day | ORAL | 3 refills | Status: DC

## 2019-06-02 NOTE — Progress Notes (Signed)
Subjective:  Patient ID: Mackenzie Everett, female    DOB: 05-13-1960  Age: 59 y.o. MRN: [id number]  CC:  Chief Complaint  Patient presents with  . Follow-up    on hypertension. pt states she hasn't had any issues wit her BP. pt has been having headaches last head ache lasted 4 days. medications didn't help. pt states she didn't have any migran symptoms like she normaly dose when she gets a migrain.    HPI DULSE RUTAN presents for   Hypertension: Currently on HCTZ 12.5 mg daily, losartan 50 mg daily, Toprol-XL 25 mg daily. Home readings:120-131/70-80.  Missed doses - only 1-2 per week. No new side effects.   BP Readings from Last 3 Encounters:  06/02/19 137/80  04/05/19 132/83  12/31/18 133/73   Lab Results  Component Value Date   CREATININE 0.86 11/19/2018   Hyperlipidemia: Not currently on statin.  Overall low ASCVD 10-year risk score.  Diet/exercise approach discussed. More water, walling as below.   The 10-year ASCVD risk score Denman George DC Montez Hageman., et al., 2013) is: 8.9%   Values used to calculate the score:     Age: 41 years     Sex: Female     Is Non-Hispanic African American: Yes     Diabetic: No     Tobacco smoker: No     Systolic Blood Pressure: 137 mmHg     Is BP treated: Yes     HDL Cholesterol: 56 mg/dL     Total Cholesterol: 221 mg/dL  Lab Results  Component Value Date   CHOL 221 (H) 11/19/2018   HDL 56 11/19/2018   LDLCALC 149 (H) 11/19/2018   TRIG 79 11/19/2018   CHOLHDL 3.9 11/19/2018   Lab Results  Component Value Date   ALT 7 11/19/2018   AST 12 11/19/2018   ALKPHOS 82 11/19/2018   BILITOT 0.3 11/19/2018   Obesity, BMI 47 Wt Readings from Last 3 Encounters:  06/02/19 (!) 333 lb (151 kg)  04/05/19 (!) 330 lb 12.8 oz (150 kg)  12/31/18 (!) 333 lb 9.6 oz (151.3 kg)   Body mass index is 47.78 kg/m.  Walking 2 days per week.  Fast food - 2 days per week.  Soda/sweet tea - cut back on soda - more cranberry juice.   Headaches: Had been having  HA about once per month for 3-4 months, lasted 1-2 days. No N/V, felt different than migraine. Pressure or pain behind eye, tender around eye and in temple, Left or right. No darkening or change in vision.  Past 5 days - has had persistent left sided headache, behind L eye. No n/v/vision changes. No fever, no change in taste or smell. No cough/dyspnea.  Had 1st covid vaccine 6 days ago, HA started the next day.  No focal weakness.  Not worst HA of her life. More functional today - able to work. Worse yesterday.  No nasal congestion, no change with otc allergy med.   [facility] Has not seen them about these sx's. Last seen about 1 year ago. . No known glaucoma or increased pressure knonw.   noted to have severe obstructive sleep apnea in  REM and supine sleep and sleep study in 2019.  AHI was mild at 9.4 overall but severe at 56, 30 with REM and supine sleep.  Plan for repeat visit for titration study, had not had that done when we discussed last year. Still has not followed up for titration study.  History Patient Active Problem List   Diagnosis Date Noted  . Hepatitis C 01/19/2016  . Obesity, Class III, BMI 40-49.9 (morbid obesity) (HCC) 09/04/2011  . Hyperlipidemia 09/04/2011  . Migraine variant 09/04/2011  . Asthma with allergic rhinitis 09/04/2011  . Degenerative disc disease, lumbar 09/04/2011   Past Medical History:  Diagnosis Date  . Allergy   . Arthritis    hands and knees  . Asthma   . Hyperlipidemia 09/04/2011  . Hypertension    Past Surgical History:  Procedure Laterality Date  . APPENDECTOMY    . BREAST SURGERY    . KNEE SURGERY     1994  . TUBAL LIGATION     Allergies  Allergen Reactions  . Caffeine Other (See Comments)    migraines  . Penicillins Hives   Prior to Admission medications   Medication Sig Start Date End Date Taking? Authorizing Provider  hydrochlorothiazide (HYDRODIURIL) 12.5 MG tablet TAKE 1 TABLET BY MOUTH EVERY DAY  12/20/18  Yes Shade Flood, MD  losartan (COZAAR) 25 MG tablet TAKE 2 TABLETS BY MOUTH EVERY DAY 12/24/18  Yes Shade Flood, MD  metoprolol succinate (TOPROL-XL) 25 MG 24 hr tablet TAKE 1 TABLET BY MOUTH EVERY DAY 03/23/19  Yes Shade Flood, MD  desloratadine (CLARINEX) 5 MG tablet TAKE 1 TABLET(5 MG) BY MOUTH DAILY Patient not taking: Reported on 04/05/2019 04/07/18   Shade Flood, MD  phenazopyridine (PYRIDIUM) 95 MG tablet Take 1 tablet (95 mg total) by mouth 3 (three) times daily as needed for pain. Patient not taking: Reported on 06/02/2019 04/05/19   Janeece Agee, NP  traMADol (ULTRAM) 50 MG tablet Take 1 tablet (50 mg total) by mouth every 8 (eight) hours as needed. Patient not taking: Reported on 06/02/2019 04/15/17   Peyton Najjar, MD   Social History   Socioeconomic History  . Marital status: Single    Spouse name: Not on file  . Number of children: Not on file  . Years of education: Not on file  . Highest education level: Not on file  Occupational History  . Not on file  Tobacco Use  . Smoking status: Never Smoker  . Smokeless tobacco: Never Used  Substance and Sexual Activity  . Alcohol use: Yes    Comment: social  . Drug use: No  . Sexual activity: Yes    Birth control/protection: Post-menopausal  Other Topics Concern  . Not on file  Social History Narrative   Lives at home alone   Social Determinants of Health   Financial Resource Strain:   . Difficulty of Paying Living Expenses: Not on file  Food Insecurity:   . Worried About Programme researcher, broadcasting/film/video in the Last Year: Not on file  . Ran Out of Food in the Last Year: Not on file  Transportation Needs:   . Lack of Transportation (Medical): Not on file  . Lack of Transportation (Non-Medical): Not on file  Physical Activity:   . Days of Exercise per Week: Not on file  . Minutes of Exercise per Session: Not on file  Stress:   . Feeling of Stress : Not on file  Social Connections:   . Frequency of  Communication with Friends and Family: Not on file  . Frequency of Social Gatherings with Friends and Family: Not on file  . Attends Religious Services: Not on file  . Active Member of Clubs or Organizations: Not on file  . Attends Banker Meetings: Not on file  .  Marital Status: Not on file  Intimate Partner Violence:   . Fear of Current or Ex-Partner: Not on file  . Emotionally Abused: Not on file  . Physically Abused: Not on file  . Sexually Abused: Not on file    Review of Systems  Constitutional: Negative for fatigue and unexpected weight change.  Eyes: Negative for photophobia and visual disturbance.  Respiratory: Negative for chest tightness and shortness of breath.   Cardiovascular: Negative for chest pain, palpitations and leg swelling.  Gastrointestinal: Negative for abdominal pain and blood in stool.  Neurological: Positive for headaches. Negative for dizziness, seizures, syncope, facial asymmetry, speech difficulty, weakness and light-headedness.     Objective:   Vitals:   06/02/19 1514  BP: 137/80  Pulse: 89  Temp: 97.7 F (36.5 C)  TempSrc: Temporal  SpO2: 96%  Weight: (!) 333 lb (151 kg)  Height: 5\' 10"  (1.778 m)     Physical Exam Vitals reviewed.  Constitutional:      Appearance: She is well-developed.  HENT:     Head: Normocephalic and atraumatic.     Comments: Temporal scalp nontender, no cords appreciated.    Nose:     Comments: Maxillary and frontal sinuses nontender, Eyes:     Conjunctiva/sclera: Conjunctivae normal.     Pupils: Pupils are equal, round, and reactive to light.     Comments: PERRL, EOMI, pain-free motion.   Neck:     Vascular: No carotid bruit.  Cardiovascular:     Rate and Rhythm: Normal rate and regular rhythm.     Heart sounds: Normal heart sounds.  Pulmonary:     Effort: Pulmonary effort is normal.     Breath sounds: Normal breath sounds.  Abdominal:     Palpations: Abdomen is soft. There is no pulsatile  mass.     Tenderness: There is no abdominal tenderness.  Skin:    General: Skin is warm and dry.  Neurological:     General: No focal deficit present.     Mental Status: She is alert and oriented to person, place, and time.     GCS: GCS eye subscore is 4. GCS verbal subscore is 5. GCS motor subscore is 6.     Cranial Nerves: No cranial nerve deficit or facial asymmetry.  Psychiatric:        Behavior: Behavior normal.        Assessment & Plan:  ALICHIA ALRIDGE is a 59 y.o. female . Essential hypertension - Plan: Comprehensive metabolic panel, losartan-hydrochlorothiazide (HYZAAR) 50-12.5 MG tablet, metoprolol succinate (TOPROL-XL) 25 MG 24 hr tablet  -Stable readings, continue same regimen.  Check labs.  Unlikely cause of headaches.   Retro-ocular headache Temporal headache - Plan: Sedimentation Rate  -Nonfocal neuro exam.  Alternating between left and right, but most recent headache left temporal, retro-ocular.    - No reported history of glaucoma/increased IOP, but did recommend ophthalmology eval/optometry eval today or tomorrow to check pressure.  Denies visual changes.  -Check sed rate based on location but less likely temporal/giant cell arteritis.  -Trial of Afrin nasal spray temporarily for possible sinus cause.  Short duration discussed.  -Follow-up for sleep apnea treatment as below.  Potential impact on headaches discussed.  Hyperlipidemia, unspecified hyperlipidemia type - Plan: Lipid panel  -Repeat lipid panel, borderline ASCVD risk score.  Can start on statin with results.  BMI 40.0-44.9, adult (HCC)  -Walking for exercise above.  Commended on increased water intake but may also want to decrease juices as those  may also be calorie laden.  OSA (obstructive sleep apnea)  -Recommended follow-up with sleep specialist for CPAP titration given severity of AHI in supine and REM sleep.  This may also impact hypertension control and headaches as above.  Understanding  expressed.  Meds ordered this encounter  Medications  . losartan-hydrochlorothiazide (HYZAAR) 50-12.5 MG tablet    Sig: Take 1 tablet by mouth daily.    Dispense:  90 tablet    Refill:  3  . metoprolol succinate (TOPROL-XL) 25 MG 24 hr tablet    Sig: Take 1 tablet (25 mg total) by mouth daily.    Dispense:  90 tablet    Refill:  1   Patient Instructions    PLease have eye specialist see you tomorrow about your headache and pain behind eye. Let me know if different referral is needed.  Can try Afrin nasal spray over-the-counter for 1 or 2 days to see if sinus congestion or pressure may be contributing.  If no improvement, stop the medicine.  I am checking a blood test to look for other causes of headache but if any worsening of headache, or worst headache of life, be seen in the emergency room.  Follow-up in 5 to 6 days by virtual visit.   Return to the clinic or go to the nearest emergency room if any of your symptoms worsen or new symptoms occur.  It is very important to follow up with sleep specialist for CPAP titration. Untreated sleep apnea can affect headaches.   No other changes in medications at this time.    If you have lab work done today you will be contacted with your lab results within the next 2 weeks.  If you have not heard from [place] then please contact us. The fastest way to get your results is to register for My Chart.   IF you received an x-ray today, you will receive an invoice from [facility]. Please contact [facility] at [phone] with questions or concerns regarding your invoice.   IF you received labwork today, you will receive an invoice from [place]. Please contact [facility] at [phone] with questions or concerns regarding your invoice.   Our billing staff will not be able to assist you with questions regarding bills from these companies.  You will be contacted with the lab results as soon as they are available. The fastest way to get  your results is to activate your My Chart account. Instructions are located on the last page of this paperwork. If you have not heard from [place] regarding the results in 2 weeks, please contact this office.         Signed, Merri Ray, MD [facility]

## 2019-06-02 NOTE — Patient Instructions (Addendum)
  PLease have eye specialist see you tomorrow about your headache and pain behind eye. Let me know if different referral is needed.  Can try Afrin nasal spray over-the-counter for 1 or 2 days to see if sinus congestion or pressure may be contributing.  If no improvement, stop the medicine.  I am checking a blood test to look for other causes of headache but if any worsening of headache, or worst headache of life, be seen in the emergency room.  Follow-up in 5 to 6 days by virtual visit.   Return to the clinic or go to the nearest emergency room if any of your symptoms worsen or new symptoms occur.  It is very important to follow up with sleep specialist for CPAP titration. Untreated sleep apnea can affect headaches.   No other changes in medications at this time.    If you have lab work done today you will be contacted with your lab results within the next 2 weeks.  If you have not heard from [place] then please contact us. The fastest way to get your results is to register for My Chart.   IF you received an x-ray today, you will receive an invoice from [facility]. Please contact [facility] at [phone] with questions or concerns regarding your invoice.   IF you received labwork today, you will receive an invoice from [place]. Please contact [facility] at [phone] with questions or concerns regarding your invoice.   Our billing staff will not be able to assist you with questions regarding bills from these companies.  You will be contacted with the lab results as soon as they are available. The fastest way to get your results is to activate your My Chart account. Instructions are located on the last page of this paperwork. If you have not heard from [place] regarding the results in 2 weeks, please contact this office.

## 2019-06-03 LAB — COMPREHENSIVE METABOLIC PANEL
ALT: 9 IU/L (ref 0–32)
AST: 11 IU/L (ref 0–40)
Albumin/Globulin Ratio: 1.6 (ref 1.2–2.2)
Albumin: 4.4 g/dL (ref 3.8–4.9)
Alkaline Phosphatase: 80 IU/L (ref 39–117)
BUN/Creatinine Ratio: 17 (ref 12–28)
BUN: 14 mg/dL (ref 8–27)
Bilirubin Total: 0.2 mg/dL (ref 0.0–1.2)
CO2: 21 mmol/L (ref 20–29)
Calcium: 9.7 mg/dL (ref 8.7–10.3)
Chloride: 104 mmol/L (ref 96–106)
Creatinine, Ser: 0.84 mg/dL (ref 0.57–1.00)
GFR calc Af Amer: 87 mL/min/{1.73_m2} (ref >59)
GFR calc non Af Amer: 76 mL/min/{1.73_m2} (ref >59)
Globulin, Total: 2.8 g/dL (ref 1.5–4.5)
Glucose: 81 mg/dL (ref 65–99)
Potassium: 4.6 mmol/L (ref 3.5–5.2)
Sodium: 142 mmol/L (ref 134–144)
Total Protein: 7.2 g/dL (ref 6.0–8.5)

## 2019-06-03 LAB — LIPID PANEL
Chol/HDL Ratio: 4 ratio (ref 0.0–4.4)
Cholesterol, Total: 226 mg/dL — ABNORMAL HIGH (ref 100–199)
HDL: 56 mg/dL (ref >39)
LDL Chol Calc (NIH): 150 mg/dL — ABNORMAL HIGH (ref 0–99)
Triglycerides: 114 mg/dL (ref 0–149)
VLDL Cholesterol Cal: 20 mg/dL (ref 5–40)

## 2019-06-03 LAB — SEDIMENTATION RATE: Sed Rate: 65 mm/hr — ABNORMAL HIGH (ref 0–40)

## 2019-06-07 ENCOUNTER — Telehealth (INDEPENDENT_AMBULATORY_CARE_PROVIDER_SITE_OTHER): Payer: BC Managed Care – PPO | Admitting: Family Medicine

## 2019-06-07 ENCOUNTER — Other Ambulatory Visit: Payer: Self-pay

## 2019-06-07 VITALS — Ht 70.0 in | Wt 333.0 lb

## 2019-06-07 DIAGNOSIS — E785 Hyperlipidemia, unspecified: Secondary | ICD-10-CM | POA: Diagnosis not present

## 2019-06-07 DIAGNOSIS — R700 Elevated erythrocyte sedimentation rate: Secondary | ICD-10-CM

## 2019-06-07 DIAGNOSIS — R519 Headache, unspecified: Secondary | ICD-10-CM

## 2019-06-07 DIAGNOSIS — I10 Essential (primary) hypertension: Secondary | ICD-10-CM

## 2019-06-07 NOTE — Progress Notes (Signed)
° ° ° °  If you have lab work done today you will be contacted with your lab results within the next 2 weeks.  If you have not heard from us then please contact us. The fastest way to get your results is to register for My Chart. ° ° °IF you received an x-ray today, you will receive an invoice from [facility]. Please contact [facility] at [phone] with questions or concerns regarding your invoice.  ° °IF you received labwork today, you will receive an invoice from [facility]. Please contact [facility] at [phone] with questions or concerns regarding your invoice.  ° °Our billing staff will not be able to assist you with questions regarding bills from these companies. ° °You will be contacted with the lab results as soon as they are available. The fastest way to get your results is to activate your My Chart account. Instructions are located on the last page of this paperwork. If you have not heard from us regarding the results in 2 weeks, please contact this office. °  ° ° ° °

## 2019-06-07 NOTE — Patient Instructions (Signed)
Glad to hear the headache is improved.  I do recommend continued follow-up with ophthalmology/eye care provider as planned.  If any return of headache, be seen right away as I would want to repeat your sed rate or inflammation test at that time as it was mildly elevated previously.  Either way please return in the next 1 to 2 weeks for a lab only visit to repeat that inflammation test.  Cholesterol mildly elevated, but okay to continue diet/exercise approach with repeat testing in 6 months.  Let me know if there are questions sooner.  Take care!

## 2019-06-07 NOTE — Progress Notes (Signed)
Virtual Visit via Video Note  I connected with Mackenzie Everett on 06/07/19 at 5:47 PM by a video enabled telemedicine application [employer] and verified that I am speaking with the correct person using two identifiers.   I discussed the limitations, risks, security and privacy concerns of performing an evaluation and management service by telephone and the availability of in person appointments. I also discussed with the patient that there may be a patient responsible charge related to this service. The patient expressed understanding and agreed to proceed, consent obtained  Chief complaint:  Chief Complaint  Patient presents with  . Follow-up    on headaches. headache stoped on staurday.      History of Present Illness: Mackenzie Everett is a 59 y.o. female  Headache: Follow up form 06/02/19.  appt pending with optho in 3 days.  HA resolved 2 days ago.  Has not yet called about CPAP titration.  No vision changes.  No dyspnea/change in taste or smell. Feeling ok.  BP earlier today normal - have been running normal.  Results for orders placed or performed in visit on 06/02/19  Comprehensive metabolic panel  Result Value Ref Range   Glucose 81 65 - 99 mg/dL   BUN 14 8 - 27 mg/dL   Creatinine, Ser 7.25 0.57 - 1.00 mg/dL   GFR calc non Af Amer 76 >59 mL/min/1.73   GFR calc Af Amer 87 >59 mL/min/1.73   BUN/Creatinine Ratio 17 12 - 28   Sodium 142 134 - 144 mmol/L   Potassium 4.6 3.5 - 5.2 mmol/L   Chloride 104 96 - 106 mmol/L   CO2 21 20 - 29 mmol/L   Calcium 9.7 8.7 - 10.3 mg/dL   Total Protein 7.2 6.0 - 8.5 g/dL   Albumin 4.4 3.8 - 4.9 g/dL   Globulin, Total 2.8 1.5 - 4.5 g/dL   Albumin/Globulin Ratio 1.6 1.2 - 2.2   Bilirubin Total 0.2 0.0 - 1.2 mg/dL   Alkaline Phosphatase 80 39 - 117 IU/L   AST 11 0 - 40 IU/L   ALT 9 0 - 32 IU/L  Lipid panel  Result Value Ref Range   Cholesterol, Total 226 (H) 100 - 199 mg/dL   Triglycerides 366 0 - 149 mg/dL   HDL 56 >44 mg/dL   VLDL  Cholesterol Cal 20 5 - 40 mg/dL   LDL Chol Calc (NIH) 034 (H) 0 - 99 mg/dL   Chol/HDL Ratio 4.0 0.0 - 4.4 ratio  Sedimentation Rate  Result Value Ref Range   Sed Rate 65 (H) 0 - 40 mm/hr    The 10-year ASCVD risk score Denman George DC Jr., et al., 2013) is: 9.1%   Values used to calculate the score:     Age: 70 years     Sex: Female     Is Non-Hispanic African American: Yes     Diabetic: No     Tobacco smoker: No     Systolic Blood Pressure: 137 mmHg     Is BP treated: Yes     HDL Cholesterol: 56 mg/dL     Total Cholesterol: 226 mg/dL     Patient Active Problem List   Diagnosis Date Noted  . Hepatitis C 01/19/2016  . Obesity, Class III, BMI 40-49.9 (morbid obesity) (HCC) 09/04/2011  . Hyperlipidemia 09/04/2011  . Migraine variant 09/04/2011  . Asthma with allergic rhinitis 09/04/2011  . Degenerative disc disease, lumbar 09/04/2011   Past Medical History:  Diagnosis Date  . Allergy   .  Arthritis    hands and knees  . Asthma   . Hyperlipidemia 09/04/2011  . Hypertension    Past Surgical History:  Procedure Laterality Date  . APPENDECTOMY    . BREAST SURGERY    . KNEE SURGERY     1994  . TUBAL LIGATION     Allergies  Allergen Reactions  . Caffeine Other (See Comments)    migraines  . Penicillins Hives   Prior to Admission medications   Medication Sig Start Date End Date Taking? Authorizing Provider  losartan-hydrochlorothiazide (HYZAAR) 50-12.5 MG tablet Take 1 tablet by mouth daily. 06/02/19  Yes Wendie Agreste, MD  metoprolol succinate (TOPROL-XL) 25 MG 24 hr tablet Take 1 tablet (25 mg total) by mouth daily. 06/02/19  Yes Wendie Agreste, MD   Social History   Socioeconomic History  . Marital status: Single    Spouse name: Not on file  . Number of children: Not on file  . Years of education: Not on file  . Highest education level: Not on file  Occupational History  . Not on file  Tobacco Use  . Smoking status: Never Smoker  . Smokeless tobacco: Never  Used  Substance and Sexual Activity  . Alcohol use: Yes    Comment: social  . Drug use: No  . Sexual activity: Yes    Birth control/protection: Post-menopausal  Other Topics Concern  . Not on file  Social History Narrative   Lives at home alone   Social Determinants of Health   Financial Resource Strain:   . Difficulty of Paying Living Expenses: Not on file  Food Insecurity:   . Worried About Charity fundraiser in the Last Year: Not on file  . Ran Out of Food in the Last Year: Not on file  Transportation Needs:   . Lack of Transportation (Medical): Not on file  . Lack of Transportation (Non-Medical): Not on file  Physical Activity:   . Days of Exercise per Week: Not on file  . Minutes of Exercise per Session: Not on file  Stress:   . Feeling of Stress : Not on file  Social Connections:   . Frequency of Communication with Friends and Family: Not on file  . Frequency of Social Gatherings with Friends and Family: Not on file  . Attends Religious Services: Not on file  . Active Member of Clubs or Organizations: Not on file  . Attends Archivist Meetings: Not on file  . Marital Status: Not on file  Intimate Partner Violence:   . Fear of Current or Ex-Partner: Not on file  . Emotionally Abused: Not on file  . Physically Abused: Not on file  . Sexually Abused: Not on file    Observations/Objective: Vitals:   06/07/19 1706  Weight: (!) 333 lb (151 kg)  Height: 5\' 10"  (1.778 m)   Nontoxic appearing on video, no distress, nonfocal, no facial droop, appropriate responses. All questions answered and understanding of plan expressed.    Assessment and Plan: Essential hypertension  -Stable on home readings, no changes  Retro-ocular headache  -Now resolved past few days.  Still plan on follow-up with ophthalmology.  Mild elevated sed rate, but as asymptomatic, unlikely temporal arteritis at this time.  If any return of headache, discussed immediate return for recheck  and recheck sed rate.  May need prednisone at that time with neuro consultation.  RTC precautions.  Elevated sed rate - Plan: Sedimentation Rate  -As above, repeat immediately if headache  returns, consider treatment for temporal arteritis.  Lab only visit planned next 1 to 2 weeks for recheck.  Hyperlipidemia, unspecified hyperlipidemia type  -Borderline ASCVD risk for, hold on meds for now, continue diet/exercise approach  Follow Up Instructions: 6 months for repeat check meds   I discussed the assessment and treatment plan with the patient. The patient was provided an opportunity to ask questions and all were answered. The patient agreed with the plan and demonstrated an understanding of the instructions.   The patient was advised to call back or seek an in-person evaluation if the symptoms worsen or if the condition fails to improve as anticipated.  I provided 8 minutes of non-face-to-face time during this encounter.   Shade Flood, MD

## 2019-06-08 ENCOUNTER — Telehealth: Payer: Self-pay | Admitting: Family Medicine

## 2019-06-08 NOTE — Telephone Encounter (Addendum)
06/08/2019 - PATIENT HAD A VIRTUAL VISIT WITH DR. Neva Seat ON Monday (06/07/2019) TO RECHECK HER HEADACHES. DR. Neva Seat HAS REQUESTED PATIENT RETURN IN 1 TO 2  WEEKS FOR A LAB APPOINTMENT ONLY TO HAVE HER SEDIMENTATION RATE CHECKED. THE LAB ORDER IS IN. HE ALSO WANTS HER TO DO HER REGULAR FOLLOW-UP IN 6 MONTHS (SEPT. 2021). I TRIED TO CALL AND SCHEDULE BUT HAD TO LEAVE HER A VOICE MAIL TO RETURN MY CALL. MBC

## 2019-06-08 NOTE — Telephone Encounter (Signed)
06/07/2019 - PATIENT

## 2019-06-26 ENCOUNTER — Other Ambulatory Visit: Payer: Self-pay | Admitting: Family Medicine

## 2019-06-26 DIAGNOSIS — I10 Essential (primary) hypertension: Secondary | ICD-10-CM

## 2019-08-25 ENCOUNTER — Other Ambulatory Visit: Payer: Self-pay

## 2019-08-25 ENCOUNTER — Ambulatory Visit: Payer: BC Managed Care – PPO | Admitting: Family Medicine

## 2019-08-25 VITALS — BP 131/78 | HR 85 | Temp 97.7°F | Resp 15 | Ht 70.0 in | Wt 333.4 lb

## 2019-08-25 DIAGNOSIS — M25561 Pain in right knee: Secondary | ICD-10-CM | POA: Diagnosis not present

## 2019-08-25 DIAGNOSIS — M79671 Pain in right foot: Secondary | ICD-10-CM

## 2019-08-25 NOTE — Progress Notes (Signed)
Subjective:  Patient ID: Mackenzie Everett, female    DOB: 1960-06-19  Age: 59 y.o. MRN: [id number]  CC:  Chief Complaint  Patient presents with  . Leg Pain    pain in Rt leg/knee, and foot, started about week and half ago, tried ibuprofen, ice, heat, massage, nothing has eased the pain, pt notes she is having a hard time bending knee without pain or feeling like the leg will give out    HPI JOELY LOSIER presents for   R foot pain: Started 1.5 weeks ago. Woke up with pain in R foot. No known injury. Felt like similar plantar fasciitis.  Had pneumatic boot that had worn for prior plantar fasciitis - wore all day for a week. no improvement.  Still sore at base of foot. 1st step worse, then improves with walking.   Tx: ibuprofen, heat, ice, soaks.no relief.  Seen by Dr. Victorino Dike for foot pain 2018.  No surgery. Had insert.   R knee pain: Past 1 week.  No injury. Started few days after foot pain. Sore to bend at times. Sore at sleep to bend more, some with straightening. Feeling of giving way a few times. No locking. No relief with above (ice, heat ibuprofen). Better some with loosening it up. No swelling/redness/fever/rash.  Meniscus tear in 90's - similar then.  Saw Dr. Yisroel Ramming at [facility] ortho most recently for foot issues.    History Patient Active Problem List   Diagnosis Date Noted  . Hepatitis C 01/19/2016  . Obesity, Class III, BMI 40-49.9 (morbid obesity) (HCC) 09/04/2011  . Hyperlipidemia 09/04/2011  . Migraine variant 09/04/2011  . Asthma with allergic rhinitis 09/04/2011  . Degenerative disc disease, lumbar 09/04/2011   Past Medical History:  Diagnosis Date  . Allergy   . Arthritis    hands and knees  . Asthma   . Hyperlipidemia 09/04/2011  . Hypertension    Past Surgical History:  Procedure Laterality Date  . APPENDECTOMY    . BREAST SURGERY    . KNEE SURGERY     1994  . TUBAL LIGATION     Allergies  Allergen Reactions  . Caffeine Other (See Comments)     migraines  . Penicillins Hives   Prior to Admission medications   Medication Sig Start Date End Date Taking? Authorizing Provider  losartan-hydrochlorothiazide (HYZAAR) 50-12.5 MG tablet Take 1 tablet by mouth daily. 06/02/19  Yes Shade Flood, MD  metoprolol succinate (TOPROL-XL) 25 MG 24 hr tablet Take 1 tablet (25 mg total) by mouth daily. 06/02/19  Yes Shade Flood, MD   Social History   Socioeconomic History  . Marital status: Single    Spouse name: Not on file  . Number of children: Not on file  . Years of education: Not on file  . Highest education level: Not on file  Occupational History  . Not on file  Tobacco Use  . Smoking status: Never Smoker  . Smokeless tobacco: Never Used  Substance and Sexual Activity  . Alcohol use: Yes    Comment: social  . Drug use: No  . Sexual activity: Yes    Birth control/protection: Post-menopausal  Other Topics Concern  . Not on file  Social History Narrative   Lives at home alone   Social Determinants of Health   Financial Resource Strain:   . Difficulty of Paying Living Expenses:   Food Insecurity:   . Worried About Programme researcher, broadcasting/film/video in the Last Year:   .  Ran Out of Food in the Last Year:   Transportation Needs:   . Freight forwarder (Medical):   Marland Kitchen Lack of Transportation (Non-Medical):   Physical Activity:   . Days of Exercise per Week:   . Minutes of Exercise per Session:   Stress:   . Feeling of Stress :   Social Connections:   . Frequency of Communication with Friends and Family:   . Frequency of Social Gatherings with Friends and Family:   . Attends Religious Services:   . Active Member of Clubs or Organizations:   . Attends Banker Meetings:   Marland Kitchen Marital Status:   Intimate Partner Violence:   . Fear of Current or Ex-Partner:   . Emotionally Abused:   Marland Kitchen Physically Abused:   . Sexually Abused:     Review of Systems Per HPI.   Objective:   Vitals:   08/25/19 1713  BP: 131/78    Pulse: 85  Resp: 15  Temp: 97.7 F (36.5 C)  TempSrc: Temporal  SpO2: 95%  Weight: (!) 333 lb 6.4 oz (151.2 kg)  Height: 5\' 10"  (1.778 m)     Physical Exam Constitutional:      General: She is not in acute distress.    Appearance: She is well-developed.  HENT:     Head: Normocephalic and atraumatic.  Cardiovascular:     Rate and Rhythm: Normal rate.  Pulmonary:     Effort: Pulmonary effort is normal.  Musculoskeletal:     Right knee: No swelling (No apparent effusion/swelling but difficult to assess with habitus.  No apparent changes compared to the left.) or deformity. Normal range of motion. Tenderness (Slight medial and lateral joint line tenderness, as well as to the popliteal fossa.) present. No LCL laxity or MCL laxity. Abnormal meniscus (No click but slight discomfort bilateral joint lines with McMurray testing.).     Right foot: Normal range of motion and normal capillary refill. Tenderness (Tender to palpation diffusely across the fifth metatarsal.  Plantar fascia nontender, heel nontender, no other focal bony tenderness of foot or ankle.) and bony tenderness present. No swelling or deformity. Normal pulse.  Neurological:     Mental Status: She is alert and oriented to person, place, and time.     DG Foot Complete Right  Result Date: 08/26/2019 CLINICAL DATA:  Foot pain EXAM: RIGHT FOOT COMPLETE - 3+ VIEW COMPARISON:  10/03/2016 FINDINGS: Diffuse soft tissue swelling identified. Degenerative changes are seen in the midfoot and first MTP joint. No fracture or dislocation identified. Posterior and plantar calcaneal heel spurs. IMPRESSION: 1. No acute findings. 2. Soft tissue swelling. 3. Degenerative changes. Electronically Signed   By: 12/04/2016 M.D.   On: 08/26/2019 10:52   DG Knee 3 Views Right  Result Date: 08/26/2019 CLINICAL DATA:  Knee pain of unknown origin EXAM: RIGHT KNEE - 3 VIEW COMPARISON:  None. FINDINGS: No evidence of fracture, dislocation, or joint  effusion. Degenerative marginal spurring greatest at the patellofemoral and medial compartment. No definite joint narrowing. A posterior ossified body on the lateral view is not clearly localized on the frontal view. IMPRESSION: Degenerative marginal spurring mainly at the patellofemoral and medial compartments. No acute finding or joint effusion. Electronically Signed   By: 08/28/2019 M.D.   On: 08/26/2019 10:52     Assessment & Plan:  SHAILY LIBRIZZI is a 59 y.o. female . Acute pain of right knee - Plan: Ambulatory referral to Orthopedic Surgery, CANCELED: DG Knee Complete 4  Views Right  -Possible pain from adjusted gait with right foot pain.  No true locking, but symptoms concerning for possible meniscal cause.  Brace attempted in office but difficulty with fit.  Has Ace bandage at home if needed for now.  Symptomatic care discussed until eval with orthopedics.  Right foot pain - Plan: Ambulatory referral to Orthopedic Surgery, CANCELED: DG Foot Complete Right  -Recurrent, previously treated by Ortho.  Soft tissue swelling without fracture, but pain along fifth metatarsal.  May need advanced imaging.  Continue cam walker for now, refer back to orthopedics.  Symptomatic care discussed.  RTC precautions.   No orders of the defined types were placed in this encounter.  Patient Instructions   X-ray tomorrow of the knee and foot but likely will refer you back to Dr. Hewitt/orthopedics.  Ibuprofen as needed or tramadol if needed for more significant pain.  Knee pain could be from walking differently and use of walking boot, but I would recommend using a walking boot for now until we see the results of x-ray of your foot and orthopedic eval. Thank you for coming in today and I will talk to you further tomorrow.   If you have lab work done today you will be contacted with your lab results within the next 2 weeks.  If you have not heard from [place] then please contact us. The fastest way to get your  results is to register for My Chart.   IF you received an x-ray today, you will receive an invoice from [facility]. Please contact [facility] at [phone] with questions or concerns regarding your invoice.   IF you received labwork today, you will receive an invoice from [facility]. Please contact [facility] at [phone] with questions or concerns regarding your invoice.   Our billing staff will not be able to assist you with questions regarding bills from these companies.  You will be contacted with the lab results as soon as they are available. The fastest way to get your results is to activate your My Chart account. Instructions are located on the last page of this paperwork. If you have not heard from [place] regarding the results in 2 weeks, please contact this office.         Signed, Merri Ray, MD [facility]

## 2019-08-25 NOTE — Patient Instructions (Addendum)
X-ray tomorrow of the knee and foot but likely will refer you back to Dr. Hewitt/orthopedics.  Ibuprofen as needed or tramadol if needed for more significant pain.  Knee pain could be from walking differently and use of walking boot, but I would recommend using a walking boot for now until we see the results of x-ray of your foot and orthopedic eval. Thank you for coming in today and I will talk to you further tomorrow.   If you have lab work done today you will be contacted with your lab results within the next 2 weeks.  If you have not heard from [place] then please contact us. The fastest way to get your results is to register for My Chart.   IF you received an x-ray today, you will receive an invoice from [facility]. Please contact [facility] at [phone] with questions or concerns regarding your invoice.   IF you received labwork today, you will receive an invoice from [place]. Please contact [facility] at [phone] with questions or concerns regarding your invoice.   Our billing staff will not be able to assist you with questions regarding bills from these companies.  You will be contacted with the lab results as soon as they are available. The fastest way to get your results is to activate your My Chart account. Instructions are located on the last page of this paperwork. If you have not heard from [place] regarding the results in 2 weeks, please contact this office.

## 2019-08-26 ENCOUNTER — Ambulatory Visit (INDEPENDENT_AMBULATORY_CARE_PROVIDER_SITE_OTHER): Payer: BC Managed Care – PPO

## 2019-08-26 ENCOUNTER — Ambulatory Visit: Payer: BC Managed Care – PPO

## 2019-08-26 DIAGNOSIS — M25561 Pain in right knee: Secondary | ICD-10-CM

## 2019-08-26 DIAGNOSIS — M25571 Pain in right ankle and joints of right foot: Secondary | ICD-10-CM

## 2019-08-29 ENCOUNTER — Encounter: Payer: Self-pay | Admitting: Family Medicine

## 2020-01-07 ENCOUNTER — Ambulatory Visit: Admit: 2020-01-07 | Payer: Self-pay | Primary: Physician

## 2020-01-07 DIAGNOSIS — Z23 Encounter for immunization: Secondary | ICD-10-CM

## 2020-01-25 ENCOUNTER — Ambulatory Visit (INDEPENDENT_AMBULATORY_CARE_PROVIDER_SITE_OTHER): Payer: BC Managed Care – PPO

## 2020-01-25 ENCOUNTER — Ambulatory Visit: Payer: BC Managed Care – PPO | Admitting: Registered Nurse

## 2020-01-25 ENCOUNTER — Other Ambulatory Visit: Payer: Self-pay

## 2020-01-25 VITALS — BP 132/83 | HR 71 | Temp 97.9°F | Resp 16 | Ht 70.0 in | Wt 326.8 lb

## 2020-01-25 DIAGNOSIS — Z1231 Encounter for screening mammogram for malignant neoplasm of breast: Secondary | ICD-10-CM

## 2020-01-25 DIAGNOSIS — Z23 Encounter for immunization: Secondary | ICD-10-CM

## 2020-01-25 DIAGNOSIS — Z1211 Encounter for screening for malignant neoplasm of colon: Secondary | ICD-10-CM | POA: Diagnosis not present

## 2020-01-25 DIAGNOSIS — M545 Low back pain, unspecified: Secondary | ICD-10-CM | POA: Diagnosis not present

## 2020-01-25 MED ORDER — METHOCARBAMOL 500 MG PO TABS: 500.0000 mg | ORAL_TABLET | Freq: Four times a day (QID) | ORAL | 0 refills | Status: DC

## 2020-01-25 MED ORDER — MELOXICAM 7.5 MG PO TABS: 7.5000 mg | ORAL_TABLET | Freq: Every day | ORAL | 0 refills | Status: DC

## 2020-01-25 MED ORDER — PREDNISONE 10 MG PO TABS: ORAL_TABLET | ORAL | 0 refills | Status: AC

## 2020-01-25 NOTE — Patient Instructions (Signed)
° ° ° °  If you have lab work done today you will be contacted with your lab results within the next 2 weeks.  If you have not heard from us then please contact us. The fastest way to get your results is to register for My Chart. ° ° °IF you received an x-ray today, you will receive an invoice from [facility]. Please contact [facility] at [phone] with questions or concerns regarding your invoice.  ° °IF you received labwork today, you will receive an invoice from [facility]. Please contact [facility] at [phone] with questions or concerns regarding your invoice.  ° °Our billing staff will not be able to assist you with questions regarding bills from these companies. ° °You will be contacted with the lab results as soon as they are available. The fastest way to get your results is to activate your My Chart account. Instructions are located on the last page of this paperwork. If you have not heard from us regarding the results in 2 weeks, please contact this office. °  ° ° ° °

## 2020-02-07 ENCOUNTER — Telehealth: Payer: Self-pay | Admitting: *Deleted

## 2020-02-07 NOTE — Telephone Encounter (Signed)
Schedule a mammogram on mobile unit 11-16

## 2020-02-08 ENCOUNTER — Telehealth: Payer: Self-pay | Admitting: *Deleted

## 2020-02-08 NOTE — Telephone Encounter (Signed)
Mackenzie Everett, Mackenzie Everett saw you on the 10-26 for back pain she stated she had some relief between then an now but woke up today and is right back to were she was when she saw you on the 10-26.    She wanted something else to be called. Do you want her to be seen again?

## 2020-02-15 ENCOUNTER — Ambulatory Visit
Admission: RE | Admit: 2020-02-15 | Discharge: 2020-02-15 | Disposition: A | Discharge status: Home / Self Care | Payer: BC Managed Care – PPO | Source: Ambulatory Visit | Attending: Family Medicine | Admitting: Family Medicine

## 2020-02-15 ENCOUNTER — Other Ambulatory Visit: Payer: Self-pay

## 2020-02-15 DIAGNOSIS — Z1231 Encounter for screening mammogram for malignant neoplasm of breast: Secondary | ICD-10-CM

## 2020-03-28 ENCOUNTER — Ambulatory Visit: Admit: 2020-03-28 | Payer: Self-pay | Primary: Physician

## 2020-03-28 DIAGNOSIS — R059 Cough, unspecified: Secondary | ICD-10-CM

## 2020-03-28 LAB — COVID-19 RNA, QUALITATIVE (SPECIMEN TRACKING BCHO)

## 2020-03-28 NOTE — Progress Notes (Unsigned)
covid-19

## 2020-03-29 LAB — COVID-19 RNA, RT-PCR/NUCLEIC ACID AMPLIFICATION: COVID-19 RNA, RT-PCR/Nucleic Acid Amplification: NOT DETECTED

## 2020-04-13 ENCOUNTER — Encounter: Payer: Self-pay | Admitting: Registered Nurse

## 2020-04-13 NOTE — Progress Notes (Signed)
Established Patient Office Visit  Subjective:  Patient ID: Mackenzie Everett, female    DOB: 07/28/1960  Age: 60 y.o. MRN: [id number]  CC:  Chief Complaint  Patient presents with  . Back Pain    pt had some pressure in her lower back and both sides, dull ache, pt reports this started saturday woke up not feeling well but notes no injury     HPI Mackenzie Everett presents for back pain  Onset Saturday Lower back, both sides, no midline pain or bony tenderness Achy pain, no sharp pain or radiation No acute injury Does note she was fairly active the week leading up to this Has tried OTCs with limited relief No other symptoms   Due for flu shot, colon cancer screening, and mammography. Pt willing to pursue orders for all 3 today.  Past Medical History:  Diagnosis Date  . Allergy   . Arthritis    hands and knees  . Asthma   . Hyperlipidemia 09/04/2011  . Hypertension     Past Surgical History:  Procedure Laterality Date  . APPENDECTOMY    . BREAST CYST EXCISION Right 1980  . BREAST SURGERY    . KNEE SURGERY     1994  . TUBAL LIGATION      Family History  Problem Relation Age of Onset  . Arthritis Mother   . Diabetes Mother   . Hyperlipidemia Mother   . Hypertension Mother   . Hyperlipidemia Sister   . Hypertension Sister   . Lupus Sister   . Diabetes Maternal Grandmother   . Heart disease Maternal Grandfather   . Heart disease Paternal Grandfather   . Cancer Maternal Aunt     Social History   Socioeconomic History  . Marital status: Single    Spouse name: Not on file  . Number of children: Not on file  . Years of education: Not on file  . Highest education level: Not on file  Occupational History  . Not on file  Tobacco Use  . Smoking status: Never Smoker  . Smokeless tobacco: Never Used  Substance and Sexual Activity  . Alcohol use: Yes    Comment: social  . Drug use: No  . Sexual activity: Yes    Birth control/protection: Post-menopausal  Other  Topics Concern  . Not on file  Social History Narrative   Lives at home alone   Social Determinants of Health   Financial Resource Strain: Not on file  Food Insecurity: Not on file  Transportation Needs: Not on file  Physical Activity: Not on file  Stress: Not on file  Social Connections: Not on file  Intimate Partner Violence: Not on file    Outpatient Medications Prior to Visit  Medication Sig Dispense Refill  . losartan-hydrochlorothiazide (HYZAAR) 50-12.5 MG tablet Take 1 tablet by mouth daily. 90 tablet 3  . metoprolol succinate (TOPROL-XL) 25 MG 24 hr tablet Take 1 tablet (25 mg total) by mouth daily. 90 tablet 1  . traMADol (ULTRAM) 50 MG tablet Take 50-100 mg by mouth 2 (two) times daily as needed.     No facility-administered medications prior to visit.    Allergies  Allergen Reactions  . Caffeine Other (See Comments)    migraines  . Penicillins Hives    ROS Review of Systems  Constitutional: Negative.   HENT: Negative.   Eyes: Negative.   Respiratory: Negative.   Cardiovascular: Negative.   Gastrointestinal: Negative.   Genitourinary: Negative.   Musculoskeletal: Negative.  Skin: Negative.   Neurological: Negative.   Psychiatric/Behavioral: Negative.   All other systems reviewed and are negative.     Objective:    Physical Exam Vitals and nursing note reviewed.  Constitutional:      General: She is not in acute distress.    Appearance: Normal appearance. She is normal weight. She is not ill-appearing, toxic-appearing or diaphoretic.  Cardiovascular:     Rate and Rhythm: Normal rate and regular rhythm.     Heart sounds: Normal heart sounds. No murmur heard. No friction rub. No gallop.   Pulmonary:     Effort: Pulmonary effort is normal. No respiratory distress.     Breath sounds: Normal breath sounds. No stridor. No wheezing, rhonchi or rales.  Chest:     Chest wall: No tenderness.  Musculoskeletal:        General: Tenderness present. Normal  range of motion.  Skin:    General: Skin is warm and dry.  Neurological:     General: No focal deficit present.     Mental Status: She is alert and oriented to person, place, and time. Mental status is at baseline.  Psychiatric:        Mood and Affect: Mood normal.        Behavior: Behavior normal.        Thought Content: Thought content normal.        Judgment: Judgment normal.     BP 132/83   Pulse 71   Temp 97.9 F (36.6 C) (Temporal)   Resp 16   Ht 5\' 10"  (1.778 m)   Wt (!) 326 lb 12.8 oz (148.2 kg)   SpO2 97%   BMI 46.89 kg/m  Wt Readings from Last 3 Encounters:  01/25/20 (!) 326 lb 12.8 oz (148.2 kg)  08/25/19 (!) 333 lb 6.4 oz (151.2 kg)  06/07/19 (!) 333 lb (151 kg)     Health Maintenance Due  Topic Date Due  . COVID-19 Vaccine (3 - Booster for Pfizer series) 12/19/2019    There are no preventive care reminders to display for this patient.  Lab Results  Component Value Date   TSH 1.020 11/19/2018   Lab Results  Component Value Date   WBC 8.4 07/07/2017   HGB 11.8 07/07/2017   HCT 36.5 07/07/2017   MCV 78 (L) 07/07/2017   PLT 288 07/07/2017   Lab Results  Component Value Date   NA 142 06/02/2019   K 4.6 06/02/2019   CO2 21 06/02/2019   GLUCOSE 81 06/02/2019   BUN 14 06/02/2019   CREATININE 0.84 06/02/2019   BILITOT 0.2 06/02/2019   ALKPHOS 80 06/02/2019   AST 11 06/02/2019   ALT 9 06/02/2019   PROT 7.2 06/02/2019   ALBUMIN 4.4 06/02/2019   CALCIUM 9.7 06/02/2019   ANIONGAP 14 06/11/2015   Lab Results  Component Value Date   CHOL 226 (H) 06/02/2019   Lab Results  Component Value Date   HDL 56 06/02/2019   Lab Results  Component Value Date   LDLCALC 150 (H) 06/02/2019   Lab Results  Component Value Date   TRIG 114 06/02/2019   Lab Results  Component Value Date   CHOLHDL 4.0 06/02/2019   Lab Results  Component Value Date   HGBA1C 6.1 (H) 12/20/2014      Assessment & Plan:   Problem List Items Addressed This Visit    None   Visit Diagnoses    Acute bilateral low back pain without sciatica    -  Primary   Relevant Medications   traMADol (ULTRAM) 50 MG tablet   meloxicam (MOBIC) 7.5 MG tablet   methocarbamol (ROBAXIN) 500 MG tablet   Other Relevant Orders   DG Lumbar Spine Complete (Completed)   Encounter for screening mammogram for malignant neoplasm of breast       Relevant Orders   MM Digital Screening (Completed)   Flu vaccine need       Relevant Orders   Flu Vaccine QUAD 6+ mos PF IM (Fluarix Quad PF) (Completed)   Special screening for malignant neoplasms, colon       Relevant Orders   Cologuard      Meds ordered this encounter  Medications  . predniSONE (DELTASONE) 10 MG tablet    Sig: Take 3 tablets (30 mg total) by mouth daily with breakfast for 2 days, THEN 2 tablets (20 mg total) daily with breakfast for 2 days, THEN 1 tablet (10 mg total) daily with breakfast for 2 days.    Dispense:  12 tablet    Refill:  0    Order Specific Question:   Supervising Provider    Answer:   Neva Seat, JEFFREY R [[id number]]  . meloxicam (MOBIC) 7.5 MG tablet    Sig: Take 1 tablet (7.5 mg total) by mouth daily.    Dispense:  30 tablet    Refill:  0    Order Specific Question:   Supervising Provider    Answer:   Neva Seat, JEFFREY R [[id number]]  . methocarbamol (ROBAXIN) 500 MG tablet    Sig: Take 1 tablet (500 mg total) by mouth 4 (four) times daily.    Dispense:  60 tablet    Refill:  0    Order Specific Question:   Supervising Provider    Answer:   Neva Seat, JEFFREY R [[id number]]    Follow-up: No follow-ups on file.   PLAN  Flu shot given  Methocarbamol, prednisone taper, and meloxicam given for back  Given cologuard order  mammo order sent  Follow up prn  Patient encouraged to call clinic with any questions, comments, or concerns.  Janeece Agee, NP

## 2020-05-31 ENCOUNTER — Other Ambulatory Visit: Payer: Self-pay

## 2020-05-31 ENCOUNTER — Ambulatory Visit: Payer: Self-pay | Admitting: *Deleted

## 2020-05-31 ENCOUNTER — Ambulatory Visit (HOSPITAL_COMMUNITY)
Admission: EM | Admit: 2020-05-31 | Discharge: 2020-05-31 | Disposition: A | Discharge status: Home / Self Care | Payer: BC Managed Care – PPO | Attending: Family Medicine | Admitting: Family Medicine

## 2020-05-31 ENCOUNTER — Encounter (HOSPITAL_COMMUNITY): Payer: Self-pay | Admitting: *Deleted

## 2020-05-31 DIAGNOSIS — R110 Nausea: Secondary | ICD-10-CM

## 2020-05-31 DIAGNOSIS — R197 Diarrhea, unspecified: Secondary | ICD-10-CM | POA: Diagnosis not present

## 2020-05-31 DIAGNOSIS — K529 Noninfective gastroenteritis and colitis, unspecified: Secondary | ICD-10-CM

## 2020-05-31 LAB — CBG MONITORING, ED: Glucose-Capillary: 95 mg/dL (ref 70–99)

## 2020-05-31 MED ORDER — ONDANSETRON 4 MG PO TBDP: 4.0000 mg | ORAL_TABLET | Freq: Three times a day (TID) | ORAL | 0 refills | Status: DC | PRN

## 2020-05-31 NOTE — Discharge Instructions (Signed)
Please do your best to ensure adequate fluid intake in order to avoid dehydration. If you find that you are unable to tolerate drinking fluids regularly please proceed to the Emergency Department for evaluation.  Also, you should return to the hospital if you experience persistent fevers for greater than 1-2 more days, increasing abdominal pain that persists despite medications, persistent diarrhea, dizziness, syncope (fainting), or for any other concerns you find worrisome.

## 2020-05-31 NOTE — ED Triage Notes (Signed)
Patient presents complaining of nausea and ongoing diarrhea. Reports Monday at work did not feel, chills, no appetite, nausea.  Went home from work and started vomiting and diarrhea.   Unable to eat/drink.  Last vomited yesterday.  Diarrhea has continued and reports being "watery".   Denies pain, denies sick contacts that she is aware of.

## 2020-05-31 NOTE — ED Provider Notes (Signed)
[facility]   [id number] 05/31/20 Arrival Time: 1428  ASSESSMENT & PLAN:  1. Gastroenteritis     No signs of dehydration requiring IVF at this time. Reports some improvement today. Tolerating sips of PO fluids.  If needed: Meds ordered this encounter  Medications  . ondansetron (ZOFRAN-ODT) 4 MG disintegrating tablet    Sig: Take 1 tablet (4 mg total) by mouth every 8 (eight) hours as needed for nausea or vomiting.    Dispense:  15 tablet    Refill:  0    Discussed typical duration of symptoms for suspected viral GI illness.   Discharge Instructions     Please do your best to ensure adequate fluid intake in order to avoid dehydration. If you find that you are unable to tolerate drinking fluids regularly please proceed to the Emergency Department for evaluation.  Also, you should return to the hospital if you experience persistent fevers for greater than 1-2 more days, increasing abdominal pain that persists despite medications, persistent diarrhea, dizziness, syncope (fainting), or for any other concerns you find worrisome.      Otherwise she will f/u with her PCP or here if not showing improvement over the next 48-72 hours.  Reviewed expectations re: course of current medical issues. Questions answered. Outlined signs and symptoms indicating need for more acute intervention. Patient verbalized understanding. After Visit Summary given.   SUBJECTIVE: History from: patient.  Mackenzie Everett is a 60 y.o. female who presents with complaint of non-bilious, non-bloody intermittent n/v with non-bloody diarrhea. Onset 2 d ago. Abdominal discomfort: mild and cramping. Symptoms are gradually improving since beginning. Aggravating factors: eating. Alleviating factors: none. Associated symptoms: fatigue. She denies dysuria, fever and myalgias. Appetite: decreased. PO intake: decreased. Ambulatory without assistance.  Sick contacts: none. Recent travel or camping: none. OTC  treatment: none.  No LMP recorded. Patient is postmenopausal.  Past Surgical History:  Procedure Laterality Date  . APPENDECTOMY    . BREAST CYST EXCISION Right 1980  . BREAST SURGERY    . KNEE SURGERY     1994  . TUBAL LIGATION       OBJECTIVE:  Vitals:   05/31/20 1444  BP: 114/81  Pulse: 91  Resp: 16  Temp: 98.6 F (37 C)  TempSrc: Oral  SpO2: 97%    General appearance: alert; no distress Oropharynx: moist Abdomen: soft; non-distended; no significant abdominal tenderness; reports "cramping" feeling; no masses or organomegaly; no guarding or rebound Back: no CVA tenderness Extremities: no edema; symmetrical with no gross deformities Skin: warm; dry Neurologic: normal gait Psychological: alert and cooperative; normal mood and affect  Labs: Results for orders placed or performed during the hospital encounter of 05/31/20  POC CBG monitoring  Result Value Ref Range   Glucose-Capillary 95 70 - 99 mg/dL   Comment 1 Notify RN    Labs Reviewed  CBG MONITORING, ED     Allergies  Allergen Reactions  . Caffeine Other (See Comments)    migraines  . Penicillins Hives                                               Past Medical History:  Diagnosis Date  . Allergy   . Arthritis    hands and knees  . Asthma   . Hyperlipidemia 09/04/2011  . Hypertension    Social History   Socioeconomic  History  . Marital status: Single    Spouse name: Not on file  . Number of children: Not on file  . Years of education: Not on file  . Highest education level: Not on file  Occupational History  . Not on file  Tobacco Use  . Smoking status: Never Smoker  . Smokeless tobacco: Never Used  Substance and Sexual Activity  . Alcohol use: Yes    Comment: social  . Drug use: No  . Sexual activity: Yes    Birth control/protection: Post-menopausal  Other Topics Concern  . Not on file  Social History Narrative   Lives at home alone   Social Determinants of Health   Financial  Resource Strain: Not on file  Food Insecurity: Not on file  Transportation Needs: Not on file  Physical Activity: Not on file  Stress: Not on file  Social Connections: Not on file  Intimate Partner Violence: Not on file   Family History  Problem Relation Age of Onset  . Arthritis Mother   . Diabetes Mother   . Hyperlipidemia Mother   . Hypertension Mother   . Hyperlipidemia Sister   . Hypertension Sister   . Lupus Sister   . Diabetes Maternal Grandmother   . Heart disease Maternal Grandfather   . Heart disease Paternal Grandfather   . Cancer Maternal Renato Gails, MD 05/31/20 [phone]

## 2020-05-31 NOTE — Telephone Encounter (Signed)
Patient got sick Monday- full feeling, pain in abdomen, vomited, diarrhea- patient states the pain is gone- but the nausea and diarrhea has continued. Call to office- no appointment available and patient fails COVID screening- advised UC per office.  Reason for Disposition . [1] SEVERE diarrhea (e.g., 7 or more times / day more than normal) AND [2] age > 60 years  Answer Assessment - Initial Assessment Questions 1. DIARRHEA SEVERITY: "How bad is the diarrhea?" "How many extra stools have you had in the past 24 hours than normal?"    - NO DIARRHEA (SCALE 0)   - MILD (SCALE 1-3): Few loose or mushy BMs; increase of 1-3 stools over normal daily number of stools; mild increase in ostomy output.   -  MODERATE (SCALE 4-7): Increase of 4-6 stools daily over normal; moderate increase in ostomy output. * SEVERE (SCALE 8-10; OR 'WORST POSSIBLE'): Increase of 7 or more stools daily over normal; moderate increase in ostomy output; incontinence.     Moderate/severe 2. ONSET: "When did the diarrhea begin?"      Monday 3. BM CONSISTENCY: "How loose or watery is the diarrhea?"      watery 4. VOMITING: "Are you also vomiting?" If Yes, ask: "How many times in the past 24 hours?"      Yes- yesterday- 1 times- nausea presnt 5. ABDOMINAL PAIN: "Are you having any abdominal pain?" If Yes, ask: "What does it feel like?" (e.g., crampy, dull, intermittent, constant)      Not since Monday 6. ABDOMINAL PAIN SEVERITY: If present, ask: "How bad is the pain?"  (e.g., Scale 1-10; mild, moderate, or severe)   - MILD (1-3): doesn't interfere with normal activities, abdomen soft and not tender to touch    - MODERATE (4-7): interferes with normal activities or awakens from sleep, tender to touch    - SEVERE (8-10): excruciating pain, doubled over, unable to do any normal activities       none 7. ORAL INTAKE: If vomiting, "Have you been able to drink liquids?" "How much fluids have you had in the past 24 hours?"     Small  sips- just drank- 8 oz, sips 8. HYDRATION: "Any signs of dehydration?" (e.g., dry mouth [not just dry lips], too weak to stand, dizziness, new weight loss) "When did you last urinate?"     - slight queasiness when gets up, 1 hour ago 9. EXPOSURE: "Have you traveled to a foreign country recently?" "Have you been exposed to anyone with diarrhea?" "Could you have eaten any food that was spoiled?"     Patient thinks she ate something Monday that upset her- but has continued 10. ANTIBIOTIC USE: "Are you taking antibiotics now or have you taken antibiotics in the past 2 months?"       no 11. OTHER SYMPTOMS: "Do you have any other symptoms?" (e.g., fever, blood in stool)       Chills (Monday), headache 12. PREGNANCY: "Is there any chance you are pregnant?" "When was your last menstrual period?"       n/a  Protocols used: DIARRHEA-A-AH

## 2020-06-21 ENCOUNTER — Telehealth: Payer: Self-pay | Admitting: Family Medicine

## 2020-06-21 NOTE — Telephone Encounter (Signed)
06/21/2020 - REBECCA FROM [facility] (DR. ROWAN'S OFFICE) CALLED TO SEE IF DR. GREENE GOT THE SURGICAL CLEARANCE LETTER THEY FAXED OVER ON 06/15/2020. SHE WILL FAX IT OVER AGAIN TODAY JUST IN CASE. THE PATIENT WILL HAVE HER SURGERY ON 07/21/2020. REBECCA'S NUMBER IS: [phone] [facility]

## 2020-06-21 NOTE — Progress Notes (Signed)
Follow up visit  Subjective:   Mackenzie Everett, female    DOB: 03-28-1961, 60 y.o.   MRN: [id number]   HPI   Chief Complaint  Patient presents with  . New Patient (Initial Visit)  . Pre-op Exam    60 y.o. African American female with hypertension, hyperlipidemia, morbid obesity, prediabetes, here for pre-op risk stratification.  Patient has had worsening knee pain, is looking forward to undergoing knee replacement surgery soon.  Moderate physical activity is limited to knee pain, she is able to walk up a flight of stairs without any chest pain or shortness breath symptoms.  She has not had any palpitation symptoms in the recent time.   Current Outpatient Medications on File Prior to Visit  Medication Sig Dispense Refill  . losartan-hydrochlorothiazide (HYZAAR) 50-12.5 MG tablet Take 1 tablet by mouth daily. 90 tablet 3  . meloxicam (MOBIC) 7.5 MG tablet Take 1 tablet (7.5 mg total) by mouth daily. 30 tablet 0  . methocarbamol (ROBAXIN) 500 MG tablet Take 1 tablet (500 mg total) by mouth 4 (four) times daily. 60 tablet 0  . metoprolol succinate (TOPROL-XL) 25 MG 24 hr tablet Take 1 tablet (25 mg total) by mouth daily. 90 tablet 1  . ondansetron (ZOFRAN-ODT) 4 MG disintegrating tablet Take 1 tablet (4 mg total) by mouth every 8 (eight) hours as needed for nausea or vomiting. 15 tablet 0  . traMADol (ULTRAM) 50 MG tablet Take 50-100 mg by mouth 2 (two) times daily as needed.     No current facility-administered medications on file prior to visit.    Cardiovascular & other pertient studies:  EKG 06/22/2020: Sinus rhythm 77 bpm Borderline LVH  Exercise treadmill stress test 06/2017: 4.6 METS Nonspecific inferolateral ST changes  Echocardiogram 06/2017: EF 55%. Mod LA dilatation Elevated right atrial pressure  Recent labs: 06/02/2019: Glucose 81, BUN/Cr 14/0.84. EGFR 87. Na/K 142/4.6. Rest of the CMP normal H/H 11.8/36.5. MCV 78. Platelets 288 HbA1C N/A Chol 226, TG 114, HDL  56, LDL 150  10/2018: TSH 1.0   Review of Systems  Cardiovascular: Negative for chest pain, dyspnea on exertion, leg swelling, palpitations and syncope.  Musculoskeletal: Positive for joint pain.         Vitals:   06/22/20 1547  BP: 136/82  Pulse: 91  Resp: 17  Temp: 98.2 F (36.8 C)  SpO2: 95%    Body mass index is 47.61 kg/m. Filed Weights   06/22/20 1547  Weight: (!) 331 lb 12.8 oz (150.5 kg)     Objective:   Physical Exam Vitals and nursing note reviewed.  Constitutional:      General: She is not in acute distress. Neck:     Vascular: No JVD.  Cardiovascular:     Rate and Rhythm: Normal rate and regular rhythm.     Heart sounds: Normal heart sounds. No murmur heard.   Pulmonary:     Effort: Pulmonary effort is normal.     Breath sounds: Normal breath sounds. No wheezing or rales.  Musculoskeletal:     Right lower leg: No edema.     Left lower leg: No edema.           Assessment & Recommendations:   60 y.o. African American female with hypertension, hyperlipidemia, morbid obesity, prediabetes, here for pre-op risk stratification.  Preoperative stratification: Previously low risk exercise treadmill stress test in 2019. Will obtain echocardiogram. She does have hyperlipidemia which will need management after surgery.  Overall, low perioperative cardiac risk.  Hypertension: Controlled    Nigel Mormon, MD Pager: [phone] Office: [phone]

## 2020-06-22 ENCOUNTER — Other Ambulatory Visit: Payer: Self-pay

## 2020-06-22 ENCOUNTER — Other Ambulatory Visit: Payer: Self-pay | Admitting: Family Medicine

## 2020-06-22 ENCOUNTER — Ambulatory Visit: Payer: BC Managed Care – PPO | Admitting: Cardiology

## 2020-06-22 ENCOUNTER — Encounter: Payer: Self-pay | Admitting: Cardiology

## 2020-06-22 VITALS — BP 136/82 | HR 91 | Temp 98.2°F | Resp 17 | Ht 70.0 in | Wt 331.8 lb

## 2020-06-22 DIAGNOSIS — Z01818 Encounter for other preprocedural examination: Secondary | ICD-10-CM | POA: Insufficient documentation

## 2020-06-22 DIAGNOSIS — I10 Essential (primary) hypertension: Secondary | ICD-10-CM

## 2020-06-22 NOTE — Telephone Encounter (Signed)
Do you all receive a surgical clearance form

## 2020-06-22 NOTE — Telephone Encounter (Signed)
Requested Prescriptions  Pending Prescriptions Disp Refills  . metoprolol succinate (TOPROL-XL) 25 MG 24 hr tablet [Pharmacy Med Name: METOPROLOL SUCC ER 25 MG TAB] 90 tablet 0    Sig: TAKE 1 TABLET BY MOUTH EVERY DAY     Cardiovascular:  Beta Blockers Passed - 06/22/2020  1:45 AM      Passed - Last BP in normal range    BP Readings from Last 1 Encounters:  05/31/20 114/81         Passed - Last Heart Rate in normal range    Pulse Readings from Last 1 Encounters:  05/31/20 91         Passed - Valid encounter within last 6 months    Recent Outpatient Visits          4 months ago Acute bilateral low back pain without sciatica   [facility], Gerlene Burdock, NP   10 months ago Acute pain of right knee   [facility], Asencion Partridge, MD   1 year ago Essential hypertension   [facility], Asencion Partridge, MD   1 year ago Essential hypertension   [facility], Asencion Partridge, MD   1 year ago Suprapubic pain   [facility], Gerlene Burdock, NP      Future Appointments            Today Patwardhan, Anabel Bene, MD [address] [facility]

## 2020-06-23 NOTE — Telephone Encounter (Signed)
Have form, called back ortho. Informed pt has not made appt with [place] but we do have from pt does have appt with cards 07/04/2020 wondering if this is meant to be her clearance

## 2020-06-24 ENCOUNTER — Encounter: Payer: Self-pay | Admitting: Cardiology

## 2020-06-24 DIAGNOSIS — I10 Essential (primary) hypertension: Secondary | ICD-10-CM | POA: Insufficient documentation

## 2020-06-26 NOTE — Telephone Encounter (Signed)
Lm asking pt to call back to schedule an appt//ELEA

## 2020-06-26 NOTE — Telephone Encounter (Signed)
Pt needs pre Op clearance with [facility] before 07/21/2020  Please call her for scheduling

## 2020-06-26 NOTE — Telephone Encounter (Signed)
Cardiology will likely complete the cardiac portion of her clearance, but for medical portion of clearance may need to meet with Rich as I would not be able to see her prior to her planned surgical date.  I  will provide paperwork to Rich in case she does make an appointment with him.  Thanks.

## 2020-06-27 NOTE — Telephone Encounter (Signed)
Appt for 07/04/2020 at 11:10 with Mackenzie Everett

## 2020-06-30 HISTORY — PX: KNEE ARTHROSCOPY: SHX127

## 2020-07-04 ENCOUNTER — Ambulatory Visit: Payer: BC Managed Care – PPO

## 2020-07-04 ENCOUNTER — Ambulatory Visit: Payer: BC Managed Care – PPO | Admitting: Registered Nurse

## 2020-07-04 ENCOUNTER — Other Ambulatory Visit: Payer: Self-pay

## 2020-07-04 ENCOUNTER — Encounter: Payer: Self-pay | Admitting: Registered Nurse

## 2020-07-04 VITALS — BP 130/82 | HR 75 | Temp 98.1°F | Resp 17 | Ht 70.0 in | Wt 329.8 lb

## 2020-07-04 DIAGNOSIS — Z01818 Encounter for other preprocedural examination: Secondary | ICD-10-CM

## 2020-07-04 LAB — CBC WITH DIFFERENTIAL/PLATELET
Basophils Absolute: 0 10*3/uL (ref 0.0–0.1)
Basophils Relative: 0.3 % (ref 0.0–3.0)
Eosinophils Absolute: 0.3 10*3/uL (ref 0.0–0.7)
Eosinophils Relative: 3.8 % (ref 0.0–5.0)
HCT: 34.7 % — ABNORMAL LOW (ref 36.0–46.0)
Hemoglobin: 11.5 g/dL — ABNORMAL LOW (ref 12.0–15.0)
Lymphocytes Relative: 32.4 % (ref 12.0–46.0)
Lymphs Abs: 2.6 10*3/uL (ref 0.7–4.0)
MCHC: 33.1 g/dL (ref 30.0–36.0)
MCV: 77.9 fl — ABNORMAL LOW (ref 78.0–100.0)
Monocytes Absolute: 0.7 10*3/uL (ref 0.1–1.0)
Monocytes Relative: 8.3 % (ref 3.0–12.0)
Neutro Abs: 4.5 10*3/uL (ref 1.4–7.7)
Neutrophils Relative %: 55.2 % (ref 43.0–77.0)
Platelets: 297 10*3/uL (ref 150.0–400.0)
RBC: 4.46 Mil/uL (ref 3.87–5.11)
RDW: 15 % (ref 11.5–15.5)
WBC: 8.1 10*3/uL (ref 4.0–10.5)

## 2020-07-04 LAB — PROTIME-INR
INR: 1.1 ratio — ABNORMAL HIGH (ref 0.8–1.0)
Prothrombin Time: 12.1 s (ref 9.6–13.1)

## 2020-07-04 LAB — URINALYSIS
Bilirubin Urine: NEGATIVE
Hgb urine dipstick: NEGATIVE
Ketones, ur: NEGATIVE
Leukocytes,Ua: NEGATIVE
Nitrite: NEGATIVE
Specific Gravity, Urine: >=1.03 — AB (ref 1.000–1.030)
Total Protein, Urine: NEGATIVE
Urine Glucose: NEGATIVE
Urobilinogen, UA: 1 (ref 0.0–1.0)
pH: 5.5 (ref 5.0–8.0)

## 2020-07-04 LAB — HEMOGLOBIN A1C: Hgb A1c MFr Bld: 6.3 % (ref 4.6–6.5)

## 2020-07-04 LAB — TSH: TSH: 0.61 u[IU]/mL (ref 0.35–4.50)

## 2020-07-06 ENCOUNTER — Other Ambulatory Visit: Payer: Self-pay | Admitting: Family Medicine

## 2020-07-06 DIAGNOSIS — I10 Essential (primary) hypertension: Secondary | ICD-10-CM

## 2020-07-14 ENCOUNTER — Ambulatory Visit: Admit: 2020-07-14 | Payer: Self-pay | Primary: Physician

## 2020-07-14 DIAGNOSIS — Z23 Encounter for immunization: Secondary | ICD-10-CM

## 2020-09-30 ENCOUNTER — Other Ambulatory Visit: Payer: Self-pay | Admitting: Family Medicine

## 2020-09-30 DIAGNOSIS — I10 Essential (primary) hypertension: Secondary | ICD-10-CM

## 2020-11-09 ENCOUNTER — Other Ambulatory Visit: Payer: Self-pay

## 2020-11-09 ENCOUNTER — Ambulatory Visit: Payer: BC Managed Care – PPO | Admitting: Family Medicine

## 2020-11-09 VITALS — BP 138/82 | HR 82 | Temp 98.3°F | Resp 16 | Ht 70.0 in | Wt 330.9 lb

## 2020-11-09 DIAGNOSIS — M545 Low back pain, unspecified: Secondary | ICD-10-CM

## 2020-11-09 DIAGNOSIS — I10 Essential (primary) hypertension: Secondary | ICD-10-CM

## 2020-11-09 DIAGNOSIS — R7303 Prediabetes: Secondary | ICD-10-CM | POA: Diagnosis not present

## 2020-11-09 DIAGNOSIS — E785 Hyperlipidemia, unspecified: Secondary | ICD-10-CM | POA: Diagnosis not present

## 2020-11-09 DIAGNOSIS — R700 Elevated erythrocyte sedimentation rate: Secondary | ICD-10-CM

## 2020-11-09 DIAGNOSIS — R519 Headache, unspecified: Secondary | ICD-10-CM | POA: Diagnosis not present

## 2020-11-09 DIAGNOSIS — Z8669 Personal history of other diseases of the nervous system and sense organs: Secondary | ICD-10-CM | POA: Diagnosis not present

## 2020-11-09 LAB — LIPID PANEL
Cholesterol: 228 mg/dL — ABNORMAL HIGH (ref 0–200)
HDL: 50.2 mg/dL (ref >39.00)
LDL Cholesterol: 153 mg/dL — ABNORMAL HIGH (ref 0–99)
NonHDL: 177.63
Total CHOL/HDL Ratio: 5
Triglycerides: 125 mg/dL (ref 0.0–149.0)
VLDL: 25 mg/dL (ref 0.0–40.0)

## 2020-11-09 LAB — HEMOGLOBIN A1C: Hgb A1c MFr Bld: 6.3 % (ref 4.6–6.5)

## 2020-11-09 LAB — COMPREHENSIVE METABOLIC PANEL
ALT: 9 U/L (ref 0–35)
AST: 10 U/L (ref 0–37)
Albumin: 4.3 g/dL (ref 3.5–5.2)
Alkaline Phosphatase: 71 U/L (ref 39–117)
BUN: 14 mg/dL (ref 6–23)
CO2: 27 mEq/L (ref 19–32)
Calcium: 9.5 mg/dL (ref 8.4–10.5)
Chloride: 105 mEq/L (ref 96–112)
Creatinine, Ser: 0.88 mg/dL (ref 0.40–1.20)
GFR: 70.48 mL/min (ref >60.00)
Glucose, Bld: 82 mg/dL (ref 70–99)
Potassium: 4.3 mEq/L (ref 3.5–5.1)
Sodium: 140 mEq/L (ref 135–145)
Total Bilirubin: 0.4 mg/dL (ref 0.2–1.2)
Total Protein: 7 g/dL (ref 6.0–8.3)

## 2020-11-09 LAB — SEDIMENTATION RATE: Sed Rate: 94 mm/hr — ABNORMAL HIGH (ref 0–30)

## 2020-11-09 MED ORDER — LOSARTAN POTASSIUM-HCTZ 50-12.5 MG PO TABS: 1.0000 | ORAL_TABLET | Freq: Every day | ORAL | 1 refills | Status: DC

## 2020-11-09 MED ORDER — SUMATRIPTAN SUCCINATE 25 MG PO TABS: 25.0000 mg | ORAL_TABLET | Freq: Once | ORAL | 0 refills | Status: DC

## 2020-11-09 NOTE — Patient Instructions (Addendum)
For prediabetes, I will check labs, and try to decrease fast food. Meal planning on the weekend may be helpful.   It would be unlikely that your blood pressure medication is causing headaches, I recommend restarting that daily.  Can recheck blood pressure in office next visit.  I did order some labs as well.  We can discuss cholesterol levels and recommendations next visit  Headache may be migraine, but I will refer you to headache specialist.  If sed rate elevated, may need to be seen by specialist sooner.  If any change in vision, darkening of vision, or acute worsening symptoms be seen in the emergency room.  Imitrex if needed for migraine symptoms, can be repeated once if needed.  Back x-ray at [facility] office.  See information below on back pain.  Tylenol is okay for now.  Return to the clinic or go to the nearest emergency room if any of your symptoms worsen or new symptoms occur.  Acute Back Pain, Adult Acute back pain is sudden and usually short-lived. It is often caused by an injury to the muscles and tissues in the back. The injury may result from: A muscle or ligament getting overstretched or torn (strained). Ligaments are tissues that connect bones to each other. Lifting something improperly can cause a back strain. Wear and tear (degeneration) of the spinal disks. Spinal disks are circular tissue that provide cushioning between the bones of the spine (vertebrae). Twisting motions, such as while playing sports or doing yard work. A hit to the back. Arthritis. You may have a physical exam, lab tests, and imaging tests to find the cause ofyour pain. Acute back pain usually goes away with rest and home care. Follow these instructions at home: Managing pain, stiffness, and swelling Treatment may include medicines for pain and inflammation that are taken by mouth or applied to the skin, prescription pain medicine, or muscle relaxants. Take over-the-counter and prescription medicines  only as told by your health care provider. Your health care provider may recommend applying ice during the first 24-48 hours after your pain starts. To do this: Put ice in a plastic bag. Place a towel between your skin and the bag. Leave the ice on for 20 minutes, 2-3 times a day. If directed, apply heat to the affected area as often as told by your health care provider. Use the heat source that your health care provider recommends, such as a moist heat pack or a heating pad. Place a towel between your skin and the heat source. Leave the heat on for 20-30 minutes. Remove the heat if your skin turns bright red. This is especially important if you are unable to feel pain, heat, or cold. You have a greater risk of getting burned. Activity  Do not stay in bed. Staying in bed for more than 1-2 days can delay your recovery. Sit up and stand up straight. Avoid leaning forward when you sit or hunching over when you stand. If you work at a desk, sit close to it so you do not need to lean over. Keep your chin tucked in. Keep your neck drawn back, and keep your elbows bent at a 90-degree angle (right angle). Sit high and close to the steering wheel when you drive. Add lower back (lumbar) support to your car seat, if needed. Take short walks on even surfaces as soon as you are able. Try to increase the length of time you walk each day. Do not sit, drive, or stand in  one place for more than 30 minutes at a time. Sitting or standing for long periods of time can put stress on your back. Do not drive or use heavy machinery while taking prescription pain medicine. Use proper lifting techniques. When you bend and lift, use positions that put less stress on your back: [place] your knees. Keep the load close to your body. Avoid twisting. Exercise regularly as told by your health care provider. Exercising helps your back heal faster and helps prevent back injuries by keeping muscles strong and flexible. Work with a  physical therapist to make a safe exercise program, as recommended by your health care provider. Do any exercises as told by your physical therapist.  Lifestyle Maintain a healthy weight. Extra weight puts stress on your back and makes it difficult to have good posture. Avoid activities or situations that make you feel anxious or stressed. Stress and anxiety increase muscle tension and can make back pain worse. Learn ways to manage anxiety and stress, such as through exercise. General instructions Sleep on a firm mattress in a comfortable position. Try lying on your side with your knees slightly bent. If you lie on your back, put a pillow under your knees. Follow your treatment plan as told by your health care provider. This may include: Cognitive or behavioral therapy. Acupuncture or massage therapy. Meditation or yoga. Contact a health care provider if: You have pain that is not relieved with rest or medicine. You have increasing pain going down into your legs or buttocks. Your pain does not improve after 2 weeks. You have pain at night. You lose weight without trying. You have a fever or chills. Get help right away if: You develop new bowel or bladder control problems. You have unusual weakness or numbness in your arms or legs. You develop nausea or vomiting. You develop abdominal pain. You feel faint. Summary Acute back pain is sudden and usually short-lived. Use proper lifting techniques. When you bend and lift, use positions that put less stress on your back. Take over-the-counter and prescription medicines and apply heat or ice as directed by your health care provider. This information is not intended to replace advice given to you by your health care provider. Make sure you discuss any questions you have with your healthcare provider. Document Revised: 12/07/2019 Document Reviewed: 12/10/2019 Elsevier Patient Education  2022 [employer]   General Headache Without Cause A  headache is pain or discomfort felt around the head or neck area. The specific cause of a headache may not be found. There are many causes and types of headaches. A few common ones are: Tension headaches. Migraine headaches. Cluster headaches. Chronic daily headaches. Follow these instructions at home: Watch your condition for any changes. Let your health care provider know aboutthem. Take these steps to help with your condition: Managing pain     Take over-the-counter and prescription medicines only as told by your health care provider. Lie down in a dark, quiet room when you have a headache. If directed, put ice on your head and neck area: Put ice in a plastic bag. Place a towel between your skin and the bag. Leave the ice on for 20 minutes, 2-3 times per day. If directed, apply heat to the affected area. Use the heat source that your health care provider recommends, such as a moist heat pack or a heating pad. Place a towel between your skin and the heat source. Leave the heat on for 20-30 minutes. Remove the heat if  your skin turns bright red. This is especially important if you are unable to feel pain, heat, or cold. You may have a greater risk of getting burned. Keep lights dim if bright lights bother you or make your headaches worse. Eating and drinking Eat meals on a regular schedule. If you drink alcohol: Limit how much you use to: 0-1 drink a day for women. 0-2 drinks a day for men. Be aware of how much alcohol is in your drink. In the [place] one drink equals one 12 oz bottle of beer (355 mL), one 5 oz glass of wine (148 mL), or one 1 oz glass of hard liquor (44 mL). Stop drinking caffeine, or decrease the amount of caffeine you drink. General instructions  Keep a headache journal to help find out what may trigger your headaches. For example, write down: What you eat and drink. How much sleep you get. Any change to your diet or medicines. Try massage or other relaxation  techniques. Limit stress. Sit up straight, and do not tense your muscles. Do not use any products that contain nicotine or tobacco, such as cigarettes, e-cigarettes, and chewing tobacco. If you need help quitting, ask your health care provider. Exercise regularly as told by your health care provider. Sleep on a regular schedule. Get 7-9 hours of sleep each night, or the amount recommended by your health care provider. Keep all follow-up visits as told by your health care provider. This is important.  Contact a health care provider if: Your symptoms are not helped by medicine. You have a headache that is different from the usual headache. You have nausea or you vomit. You have a fever. Get help right away if: Your headache becomes severe quickly. Your headache gets worse after moderate to intense physical activity. You have repeated vomiting. You have a stiff neck. You have a loss of vision. You have problems with speech. You have pain in the eye or ear. You have muscular weakness or loss of muscle control. You lose your balance or have trouble walking. You feel faint or pass out. You have confusion. You have a seizure. Summary A headache is pain or discomfort felt around the head or neck area. There are many causes and types of headaches. In some cases, the cause may not be found. Keep a headache journal to help find out what may trigger your headaches. Watch your condition for any changes. Let your health care provider know about them. Contact a health care provider if you have a headache that is different from the usual headache, or if your symptoms are not helped by medicine. Get help right away if your headache becomes severe, you vomit, you have a loss of vision, you lose your balance, or you have a seizure. This information is not intended to replace advice given to you by your health care provider. Make sure you discuss any questions you have with your healthcare  provider. Document Revised: 10/06/2017 Document Reviewed: 10/06/2017 Elsevier Patient Education  2022 [employer] Inc.      Diabetes Care, 44(Suppl 1), [phone]. https://[email or web address]://[email or web address]/[email or web address]-[email or web address]">  Prediabetes Prediabetes is when your blood sugar (blood glucose) level is higher than normal but not high enough for you to be diagnosed with type 2 diabetes. Having prediabetes puts you at risk for developing type 2 diabetes (type 2 diabetes mellitus). With certain lifestyle changes, you may be able to prevent or delay the onset of type 2 diabetes. This is important because type 2 diabetes can lead to serious complications,  such as: Heart disease. Stroke. Blindness. Kidney disease. Depression. Poor circulation in the feet and legs. In severe cases, this could lead to surgical removal of a leg (amputation). What are the causes? The exact cause of prediabetes is not known. It may result from insulin resistance. Insulin resistance develops when cells in the body do not respond properly to insulin that the body makes. This can cause excess glucose to build up in the blood. High blood glucose (hyperglycemia) can develop. What increases the risk? The following factors may make you more likely to develop this condition: You have a family member with type 2 diabetes. You are older than 45 years. You had a temporary form of diabetes during a pregnancy (gestational diabetes). You had polycystic ovary syndrome (PCOS). You are overweight or obese. You are inactive (sedentary). You have a history of heart disease, including problems with cholesterol levels, high levels of blood fats, or high blood pressure. What are the signs or symptoms? You may have no symptoms. If you do have symptoms, they may include: Increased hunger. Increased thirst. Increased urination. Vision changes, such as blurry vision. Tiredness (fatigue). How is this diagnosed? This condition can be diagnosed with  blood tests. Your blood glucose may be checked with one or more of the following tests: A fasting blood glucose (FBG) test. You will not be allowed to eat (you will fast) for at least 8 hours before a blood sample is taken. An A1C blood test (hemoglobin A1C). This test provides information about blood glucose levels over the previous 2?3 months. An oral glucose tolerance test (OGTT). This test measures your blood glucose at two points in time: After fasting. This is your baseline level. Two hours after you drink a beverage that contains glucose. You may be diagnosed with prediabetes if: Your FBG is 100?125 mg/dL (0.2-7.2 mmol/L). Your A1C level is 5.7?6.4% (39-46 mmol/mol). Your OGTT result is 140?199 mg/dL (5.3-66 mmol/L). These blood tests may be repeated to confirm your diagnosis. How is this treated? Treatment may include dietary and lifestyle changes to help lower your blood glucose and prevent type 2 diabetes from developing. In some cases, medicinemay be prescribed to help lower the risk of type 2 diabetes. Follow these instructions at home: Nutrition  Follow a healthy meal plan. This includes eating lean proteins, whole grains, legumes, fresh fruits and vegetables, low-fat dairy products, and healthy fats. Follow instructions from your health care provider about eating or drinking restrictions. Meet with a dietitian to create a healthy eating plan that is right for you.  Lifestyle Do moderate-intensity exercise for at least 30 minutes a day on 5 or more days each week, or as told by your health care provider. A mix of activities may be best, such as: Brisk walking, swimming, biking, and weight lifting. Lose weight as told by your health care provider. Losing 5-7% of your body weight can reverse insulin resistance. Do not drink alcohol if: Your health care provider tells you not to drink. You are pregnant, may be pregnant, or are planning to become pregnant. If you drink  alcohol: Limit how much you use to: 0-1 drink a day for women. 0-2 drinks a day for men. Be aware of how much alcohol is in your drink. In the [place] one drink equals one 12 oz bottle of beer (355 mL), one 5 oz glass of wine (148 mL), or one 1 oz glass of hard liquor (44 mL). General instructions Take over-the-counter and prescription medicines only as told by  your health care provider. You may be prescribed medicines that help lower the risk of type 2 diabetes. Do not use any products that contain nicotine or tobacco, such as cigarettes, e-cigarettes, and chewing tobacco. If you need help quitting, ask your health care provider. Keep all follow-up visits. This is important. Where to find more information American Diabetes Association: www.diabetes.org Academy of Nutrition and Dietetics: www.eatright.org American Heart Association: www.heart.org Contact a health care provider if: You have any of these symptoms: Increased hunger. Increased urination. Increased thirst. Fatigue. Vision changes, such as blurry vision. Get help right away if you: Have shortness of breath. Feel confused. Vomit or feel like you may vomit. Summary Prediabetes is when your blood sugar (blood glucose)level is higher than normal but not high enough for you to be diagnosed with type 2 diabetes. Having prediabetes puts you at risk for developing type 2 diabetes (type 2 diabetes mellitus). Make lifestyle changes such as eating a healthy diet and exercising regularly to help prevent diabetes. Lose weight as told by your health care provider. This information is not intended to replace advice given to you by your health care provider. Make sure you discuss any questions you have with your healthcare provider. Document Revised: 06/17/2019 Document Reviewed: 06/17/2019 Elsevier Patient Education  2022 [employer]

## 2020-11-09 NOTE — Progress Notes (Signed)
Subjective:  Patient ID: Mackenzie Everett, female    DOB: 1960-04-07  Age: 60 y.o. MRN: [id number]  CC:  Chief Complaint  Patient presents with   Hypertension    Pt reports when takes Losartan HCTZ gets a headache that will cause nausea, pt reports she will take BP and notes it is okay,notes has not taken med last 2 days bp okay today     HPI Mackenzie Everett presents for   Hypertension, headache Currently treated with losartan HCTZ 50/12.5 mg daily, Toprol-XL 25 mg daily.  History of migraine.  Does report some headaches with nausea that she associates with taking losartan HCTZ.  Has been off meds past 2 days. Although she has seen my colleague more recently, I last saw her for telemedicine visit in March 2021.  Discussed retro-ocular headache at that time and ophthalmology follow-up.  Mild elevated sed rate at that time but unlikely temporal arteritis as asymptomatic on recheck.  Home readings: intermittent testing - difficult with arm size. 120-125/70-80 range on meds.  Headache: Once last month, then again 3 days ago.  Behind left eye to under eye, and towards temple. No rash.  Same as last year. HA about 1-2/month some months without HA.  Left sided headache lasts from 1-3 days. Initially strong HA, tapers on second day, min on 1/3rd day. Associated nausea, no vomiting.  Took zofran once. No amaurosis fugax/darkening or change in vision. No loss or blurry vision. No eye watering.  Discussed HA with eye provider - did not think HA was related to eyes.   No relief with tylenol, tramadol.  No recent visit with HA specialist. Prior HA center. Imitrex and topamax in past for migraine.  Leaving in 1 week for cruise.   BP Readings from Last 3 Encounters:  11/09/20 138/82  07/04/20 130/82  06/22/20 136/82   Lab Results  Component Value Date   CREATININE 0.84 06/02/2019   Lab Results  Component Value Date   ESRSEDRATE 65 (H) 06/02/2019   POCTSEDRATE 60 (A) 09/01/2014    Prediabetes: Last tested on preop visit in April.  Similar weight at that time. R knee surgery earlier this year. Walking more. Some back pain at times. More walking with job. Occasional pinch in back. Occasional tylenol or advil once per week.  Fast food: 3 days per week.  Rare soda, no sweet tea. No alcohol.     Lab Results  Component Value Date   HGBA1C 6.3 07/04/2020   Wt Readings from Last 3 Encounters:  11/09/20 (!) 330 lb 14.4 oz (150.1 kg)  07/04/20 (!) 329 lb 12.8 oz (149.6 kg)  06/22/20 (!) 331 lb 12.8 oz (150.5 kg)     Hyperlipidemia: No current meds.  Discussed briefly with cardiology at preoperative risk stratification in March, recommended management after her surgery.  Due for updated testing. No early CAD hx in family. The 10-year ASCVD risk score Denman George DC Montez Hageman., et al., 2013) is: 10.4%   Values used to calculate the score:     Age: 58 years     Sex: Female     Is Non-Hispanic African American: Yes     Diabetic: No     Tobacco smoker: No     Systolic Blood Pressure: 138 mmHg     Is BP treated: Yes     HDL Cholesterol: 56 mg/dL     Total Cholesterol: 226 mg/dL  Lab Results  Component Value Date   CHOL 226 (H) 06/02/2019  HDL 56 06/02/2019   LDLCALC 150 (H) 06/02/2019   TRIG 114 06/02/2019   CHOLHDL 4.0 06/02/2019   Lab Results  Component Value Date   ALT 9 06/02/2019   AST 11 06/02/2019   ALKPHOS 80 06/02/2019   BILITOT 0.2 06/02/2019    Low back pain as above, no known injury.  Mid aspect of her lower back to the right side without sciatica symptoms.  No fever, unexplained weight loss, night sweats, no bowel or bladder incontinence or leg weakness.  History Patient Active Problem List   Diagnosis Date Noted   Hypertension 06/24/2020   Pre-op evaluation 06/22/2020   Hepatitis C 01/19/2016   Obesity, Class III, BMI 40-49.9 (morbid obesity) (HCC) 09/04/2011   Hyperlipidemia 09/04/2011   Migraine variant 09/04/2011   Asthma with allergic  rhinitis 09/04/2011   Degenerative disc disease, lumbar 09/04/2011   Past Medical History:  Diagnosis Date   Allergy    Arthritis    hands and knees   Asthma    Hyperlipidemia 09/04/2011   Hypertension    Past Surgical History:  Procedure Laterality Date   APPENDECTOMY     BREAST CYST EXCISION Right 1980   BREAST SURGERY     KNEE SURGERY     1994   TUBAL LIGATION     Allergies  Allergen Reactions   Caffeine Other (See Comments)    migraines   Penicillins Hives   Prior to Admission medications   Medication Sig Start Date End Date Taking? Authorizing Provider  HYDROcodone-acetaminophen (NORCO/VICODIN) 5-325 MG tablet Take 1 tablet by mouth as needed. 05/23/20   [provider]  losartan-hydrochlorothiazide (HYZAAR) 50-12.5 MG tablet TAKE 1 TABLET BY MOUTH EVERY DAY 07/25/20   Shade Flood, MD  methocarbamol (ROBAXIN) 500 MG tablet Take 1 tablet (500 mg total) by mouth 4 (four) times daily. Patient taking differently: Take 500 mg by mouth as needed. 01/25/20   Janeece Agee, NP  metoprolol succinate (TOPROL-XL) 25 MG 24 hr tablet TAKE 1 TABLET BY MOUTH EVERY DAY 10/04/20   Shade Flood, MD  traMADol (ULTRAM) 50 MG tablet Take 50-100 mg by mouth 2 (two) times daily as needed. 01/19/20   [provider]   Social History   Socioeconomic History   Marital status: Single    Spouse name: Not on file   Number of children: 4   Years of education: Not on file   Highest education level: Not on file  Occupational History   Not on file  Tobacco Use   Smoking status: Never   Smokeless tobacco: Never  Vaping Use   Vaping Use: Never used  Substance and Sexual Activity   Alcohol use: Yes    Comment: social   Drug use: No   Sexual activity: Yes    Birth control/protection: Post-menopausal  Other Topics Concern   Not on file  Social History Narrative   Lives at home alone   Social Determinants of Health   Financial Resource Strain: Not on file  Food  Insecurity: Not on file  Transportation Needs: Not on file  Physical Activity: Not on file  Stress: Not on file  Social Connections: Not on file  Intimate Partner Violence: Not on file    Review of Systems  Constitutional:  Negative for fatigue and unexpected weight change.  Respiratory:  Negative for chest tightness and shortness of breath.   Cardiovascular:  Negative for chest pain and palpitations.  Gastrointestinal:  Negative for abdominal pain and blood in  stool.  Neurological:  Positive for headaches. Negative for dizziness, syncope and light-headedness.    Objective:   Vitals:   11/09/20 1148  BP: 138/82  Pulse: 82  Resp: 16  Temp: 98.3 F (36.8 C)  TempSrc: Temporal  SpO2: 97%  Weight: (!) 330 lb 14.4 oz (150.1 kg)  Height: 5\' 10"  (1.778 m)     Physical Exam Vitals reviewed.  Constitutional:      Appearance: Normal appearance. She is well-developed.     Comments: Overweight.   HENT:     Head: Normocephalic and atraumatic.     Comments: No temporal ttp, no cords.     Nose:     Comments: Frontal, maxillary sinuses nontender.   Eyes:     Extraocular Movements: Extraocular movements intact.     Conjunctiva/sclera: Conjunctivae normal.     Pupils: Pupils are equal, round, and reactive to light.  Neck:     Vascular: No carotid bruit.  Cardiovascular:     Rate and Rhythm: Normal rate and regular rhythm.     Heart sounds: Normal heart sounds.  Pulmonary:     Effort: Pulmonary effort is normal.     Breath sounds: Normal breath sounds.  Abdominal:     Palpations: Abdomen is soft. There is no pulsatile mass.     Tenderness: There is no abdominal tenderness.  Musculoskeletal:     Right lower leg: Edema (1+ pitting to mid tibia bilat.) present.     Left lower leg: Edema present.     Comments: Slight discomfort with palpation of lower mid lumbar spine into the right paraspinals.  Negative seated straight leg raise.  Ambulates without difficulty.  Skin:     General: Skin is warm and dry.  Neurological:     Mental Status: She is alert and oriented to person, place, and time.  Psychiatric:        Mood and Affect: Mood normal.        Behavior: Behavior normal.    48 minutes spent during visit, including chart review, counseling and assimilation of information, exam, discussion of plan including HA, back pain and ER precautions, and chart completion.    Assessment & Plan:  Mackenzie Everett is a 60 y.o. female . Left-sided headache History of migraine - Plan: SUMAtriptan (IMITREX) 25 MG tablet Elevated sed rate - Plan: Sedimentation rate  -Similar symptoms as in the past.  Suspected migraine, less likely cluster headache.  Unlikely temporal arteritis but with prior elevated sed rate we will repeat testing.  ER precautions given if any visual symptoms.  Refer to headache specialist.  Prediabetes - Plan: Hemoglobin A1c  -Handout given, discussed ways to minimize need for fast food, exercise limited by back pain currently but improved knee pain after surgery.  Check A1c  Hyperlipidemia, unspecified hyperlipidemia type - Plan: Comprehensive metabolic panel, Lipid panel  -Check labs to determine 10-year ASCVD risk score and will discuss results at follow-up to consider statin.  Right-sided low back pain without sciatica, unspecified chronicity - Plan: DG Lumbar Spine Complete  -No known injury, suspect component of degenerative disc disease.  Will check x-ray, handout given on treatment of back pain, avoid NSAIDs given hypertension, Tylenol is okay for now.  Recheck next week.  Essential hypertension - Plan: Comprehensive metabolic panel, losartan-hydrochlorothiazide (HYZAAR) 50-12.5 MG tablet  -Unlikely meds causing headache, and with edema and controlled BP on meds recommended restarting losartan HCTZ for now.  Recheck next week.  Labs as above.  Meds ordered  this encounter  Medications   SUMAtriptan (IMITREX) 25 MG tablet    Sig: Take 1 tablet (25  mg total) by mouth once for 1 dose. May repeat once in 2 hours if headache persists or recurs.    Dispense:  10 tablet    Refill:  0   losartan-hydrochlorothiazide (HYZAAR) 50-12.5 MG tablet    Sig: Take 1 tablet by mouth daily.    Dispense:  90 tablet    Refill:  1    DX Code Needed  .   Patient Instructions  For prediabetes, I will check labs, and try to decrease fast food. Meal planning on the weekend may be helpful.   It would be unlikely that your blood pressure medication is causing headaches, I recommend restarting that daily.  Can recheck blood pressure in office next visit.  I did order some labs as well.  We can discuss cholesterol levels and recommendations next visit  Headache may be migraine, but I will refer you to headache specialist.  If sed rate elevated, may need to be seen by specialist sooner.  If any change in vision, darkening of vision, or acute worsening symptoms be seen in the emergency room.  Imitrex if needed for migraine symptoms, can be repeated once if needed.  Back x[facility] at [facility] office.  See information below on back pain.  Tylenol is okay for now.  Return to the clinic or go to the nearest emergency room if any of your symptoms worsen or new symptoms occur.  Acute Back Pain, Adult Acute back pain is sudden and usually short-lived. It is often caused by an injury to the muscles and tissues in the back. The injury may result from: A muscle or ligament getting overstretched or torn (strained). Ligaments are tissues that connect bones to each other. Lifting something improperly can cause a back strain. Wear and tear (degeneration) of the spinal disks. Spinal disks are circular tissue that provide cushioning between the bones of the spine (vertebrae). Twisting motions, such as while playing sports or doing yard work. A hit to the back. Arthritis. You may have a physical exam, lab tests, and imaging tests to find the cause ofyour pain. Acute back pain  usually goes away with rest and home care. Follow these instructions at home: Managing pain, stiffness, and swelling Treatment may include medicines for pain and inflammation that are taken by mouth or applied to the skin, prescription pain medicine, or muscle relaxants. Take over-the-counter and prescription medicines only as told by your health care provider. Your health care provider may recommend applying ice during the first 24-48 hours after your pain starts. To do this: Put ice in a plastic bag. Place a towel between your skin and the bag. Leave the ice on for 20 minutes, 2-3 times a day. If directed, apply heat to the affected area as often as told by your health care provider. Use the heat source that your health care provider recommends, such as a moist heat pack or a heating pad. Place a towel between your skin and the heat source. Leave the heat on for 20-30 minutes. Remove the heat if your skin turns bright red. This is especially important if you are unable to feel pain, heat, or cold. You have a greater risk of getting burned. Activity  Do not stay in bed. Staying in bed for more than 1-2 days can delay your recovery. Sit up and stand up straight. Avoid leaning forward when you sit or hunching  over when you stand. If you work at a desk, sit close to it so you do not need to lean over. Keep your chin tucked in. Keep your neck drawn back, and keep your elbows bent at a 90-degree angle (right angle). Sit high and close to the steering wheel when you drive. Add lower back (lumbar) support to your car seat, if needed. Take short walks on even surfaces as soon as you are able. Try to increase the length of time you walk each day. Do not sit, drive, or stand in one place for more than 30 minutes at a time. Sitting or standing for long periods of time can put stress on your back. Do not drive or use heavy machinery while taking prescription pain medicine. Use proper lifting techniques.  When you bend and lift, use positions that put less stress on your back: [place] your knees. Keep the load close to your body. Avoid twisting. Exercise regularly as told by your health care provider. Exercising helps your back heal faster and helps prevent back injuries by keeping muscles strong and flexible. Work with a physical therapist to make a safe exercise program, as recommended by your health care provider. Do any exercises as told by your physical therapist.  Lifestyle Maintain a healthy weight. Extra weight puts stress on your back and makes it difficult to have good posture. Avoid activities or situations that make you feel anxious or stressed. Stress and anxiety increase muscle tension and can make back pain worse. Learn ways to manage anxiety and stress, such as through exercise. General instructions Sleep on a firm mattress in a comfortable position. Try lying on your side with your knees slightly bent. If you lie on your back, put a pillow under your knees. Follow your treatment plan as told by your health care provider. This may include: Cognitive or behavioral therapy. Acupuncture or massage therapy. Meditation or yoga. Contact a health care provider if: You have pain that is not relieved with rest or medicine. You have increasing pain going down into your legs or buttocks. Your pain does not improve after 2 weeks. You have pain at night. You lose weight without trying. You have a fever or chills. Get help right away if: You develop new bowel or bladder control problems. You have unusual weakness or numbness in your arms or legs. You develop nausea or vomiting. You develop abdominal pain. You feel faint. Summary Acute back pain is sudden and usually short-lived. Use proper lifting techniques. When you bend and lift, use positions that put less stress on your back. Take over-the-counter and prescription medicines and apply heat or ice as directed by your health care  provider. This information is not intended to replace advice given to you by your health care provider. Make sure you discuss any questions you have with your healthcare provider. Document Revised: 12/07/2019 Document Reviewed: 12/10/2019 Elsevier Patient Education  2022 [employer]   General Headache Without Cause A headache is pain or discomfort felt around the head or neck area. The specific cause of a headache may not be found. There are many causes and types of headaches. A few common ones are: Tension headaches. Migraine headaches. Cluster headaches. Chronic daily headaches. Follow these instructions at home: Watch your condition for any changes. Let your health care provider know aboutthem. Take these steps to help with your condition: Managing pain     Take over-the-counter and prescription medicines only as told by your health care provider. Lie down  in a dark, quiet room when you have a headache. If directed, put ice on your head and neck area: Put ice in a plastic bag. Place a towel between your skin and the bag. Leave the ice on for 20 minutes, 2-3 times per day. If directed, apply heat to the affected area. Use the heat source that your health care provider recommends, such as a moist heat pack or a heating pad. Place a towel between your skin and the heat source. Leave the heat on for 20-30 minutes. Remove the heat if your skin turns bright red. This is especially important if you are unable to feel pain, heat, or cold. You may have a greater risk of getting burned. Keep lights dim if bright lights bother you or make your headaches worse. Eating and drinking Eat meals on a regular schedule. If you drink alcohol: Limit how much you use to: 0-1 drink a day for women. 0-2 drinks a day for men. Be aware of how much alcohol is in your drink. In the [place] one drink equals one 12 oz bottle of beer (355 mL), one 5 oz glass of wine (148 mL), or one 1 oz glass of hard  liquor (44 mL). Stop drinking caffeine, or decrease the amount of caffeine you drink. General instructions  Keep a headache journal to help find out what may trigger your headaches. For example, write down: What you eat and drink. How much sleep you get. Any change to your diet or medicines. Try massage or other relaxation techniques. Limit stress. Sit up straight, and do not tense your muscles. Do not use any products that contain nicotine or tobacco, such as cigarettes, e-cigarettes, and chewing tobacco. If you need help quitting, ask your health care provider. Exercise regularly as told by your health care provider. Sleep on a regular schedule. Get 7-9 hours of sleep each night, or the amount recommended by your health care provider. Keep all follow-up visits as told by your health care provider. This is important.  Contact a health care provider if: Your symptoms are not helped by medicine. You have a headache that is different from the usual headache. You have nausea or you vomit. You have a fever. Get help right away if: Your headache becomes severe quickly. Your headache gets worse after moderate to intense physical activity. You have repeated vomiting. You have a stiff neck. You have a loss of vision. You have problems with speech. You have pain in the eye or ear. You have muscular weakness or loss of muscle control. You lose your balance or have trouble walking. You feel faint or pass out. You have confusion. You have a seizure. Summary A headache is pain or discomfort felt around the head or neck area. There are many causes and types of headaches. In some cases, the cause may not be found. Keep a headache journal to help find out what may trigger your headaches. Watch your condition for any changes. Let your health care provider know about them. Contact a health care provider if you have a headache that is different from the usual headache, or if your symptoms are not  helped by medicine. Get help right away if your headache becomes severe, you vomit, you have a loss of vision, you lose your balance, or you have a seizure. This information is not intended to replace advice given to you by your health care provider. Make sure you discuss any questions you have with your healthcare provider. Document Revised:  10/06/2017 Document Reviewed: 10/06/2017 Elsevier Patient Education  2022 Elsevier Inc.      Diabetes Care, 44(Suppl 1), [phone]. https://doi.org/https://doi.org/10.2337/dc21-S003">  Prediabetes Prediabetes is when your blood sugar (blood glucose) level is higher than normal but not high enough for you to be diagnosed with type 2 diabetes. Having prediabetes puts you at risk for developing type 2 diabetes (type 2 diabetes mellitus). With certain lifestyle changes, you may be able to prevent or delay the onset of type 2 diabetes. This is important because type 2 diabetes can lead to serious complications, such as: Heart disease. Stroke. Blindness. Kidney disease. Depression. Poor circulation in the feet and legs. In severe cases, this could lead to surgical removal of a leg (amputation). What are the causes? The exact cause of prediabetes is not known. It may result from insulin resistance. Insulin resistance develops when cells in the body do not respond properly to insulin that the body makes. This can cause excess glucose to build up in the blood. High blood glucose (hyperglycemia) can develop. What increases the risk? The following factors may make you more likely to develop this condition: You have a family member with type 2 diabetes. You are older than 45 years. You had a temporary form of diabetes during a pregnancy (gestational diabetes). You had polycystic ovary syndrome (PCOS). You are overweight or obese. You are inactive (sedentary). You have a history of heart disease, including problems with cholesterol levels, high levels of blood  fats, or high blood pressure. What are the signs or symptoms? You may have no symptoms. If you do have symptoms, they may include: Increased hunger. Increased thirst. Increased urination. Vision changes, such as blurry vision. Tiredness (fatigue). How is this diagnosed? This condition can be diagnosed with blood tests. Your blood glucose may be checked with one or more of the following tests: A fasting blood glucose (FBG) test. You will not be allowed to eat (you will fast) for at least 8 hours before a blood sample is taken. An A1C blood test (hemoglobin A1C). This test provides information about blood glucose levels over the previous 2?3 months. An oral glucose tolerance test (OGTT). This test measures your blood glucose at two points in time: After fasting. This is your baseline level. Two hours after you drink a beverage that contains glucose. You may be diagnosed with prediabetes if: Your FBG is 100?125 mg/dL (0.9-8.1 mmol/L). Your A1C level is 5.7?6.4% (39-46 mmol/mol). Your OGTT result is 140?199 mg/dL (1.9-14 mmol/L). These blood tests may be repeated to confirm your diagnosis. How is this treated? Treatment may include dietary and lifestyle changes to help lower your blood glucose and prevent type 2 diabetes from developing. In some cases, medicinemay be prescribed to help lower the risk of type 2 diabetes. Follow these instructions at home: Nutrition  Follow a healthy meal plan. This includes eating lean proteins, whole grains, legumes, fresh fruits and vegetables, low-fat dairy products, and healthy fats. Follow instructions from your health care provider about eating or drinking restrictions. Meet with a dietitian to create a healthy eating plan that is right for you.  Lifestyle Do moderate-intensity exercise for at least 30 minutes a day on 5 or more days each week, or as told by your health care provider. A mix of activities may be best, such as: Brisk walking, swimming,  biking, and weight lifting. Lose weight as told by your health care provider. Losing 5-7% of your body weight can reverse insulin resistance. Do not drink alcohol if: Your  health care provider tells you not to drink. You are pregnant, may be pregnant, or are planning to become pregnant. If you drink alcohol: Limit how much you use to: 0-1 drink a day for women. 0-2 drinks a day for men. Be aware of how much alcohol is in your drink. In the [place] one drink equals one 12 oz bottle of beer (355 mL), one 5 oz glass of wine (148 mL), or one 1 oz glass of hard liquor (44 mL). General instructions Take over-the-counter and prescription medicines only as told by your health care provider. You may be prescribed medicines that help lower the risk of type 2 diabetes. Do not use any products that contain nicotine or tobacco, such as cigarettes, e-cigarettes, and chewing tobacco. If you need help quitting, ask your health care provider. Keep all follow-up visits. This is important. Where to find more information American Diabetes Association: www.diabetes.org Academy of Nutrition and Dietetics: www.eatright.org American Heart Association: www.heart.org Contact a health care provider if: You have any of these symptoms: Increased hunger. Increased urination. Increased thirst. Fatigue. Vision changes, such as blurry vision. Get help right away if you: Have shortness of breath. Feel confused. Vomit or feel like you may vomit. Summary Prediabetes is when your blood sugar (blood glucose)level is higher than normal but not high enough for you to be diagnosed with type 2 diabetes. Having prediabetes puts you at risk for developing type 2 diabetes (type 2 diabetes mellitus). Make lifestyle changes such as eating a healthy diet and exercising regularly to help prevent diabetes. Lose weight as told by your health care provider. This information is not intended to replace advice given to you by your health  care provider. Make sure you discuss any questions you have with your healthcare provider. Document Revised: 06/17/2019 Document Reviewed: 06/17/2019 Elsevier Patient Education  2022 Elsevier Inc.         Signed,   Meredith Staggers, MD  Primary Care, [facility], [facility] 11/09/20 12:34 PM

## 2020-11-13 ENCOUNTER — Ambulatory Visit (HOSPITAL_COMMUNITY): Payer: Self-pay

## 2020-11-15 ENCOUNTER — Other Ambulatory Visit: Payer: Self-pay

## 2020-11-15 ENCOUNTER — Ambulatory Visit (INDEPENDENT_AMBULATORY_CARE_PROVIDER_SITE_OTHER): Payer: BC Managed Care – PPO | Admitting: Family Medicine

## 2020-11-15 VITALS — BP 134/84 | HR 95 | Temp 98.2°F | Resp 17 | Ht 70.0 in | Wt 325.6 lb

## 2020-11-15 DIAGNOSIS — R700 Elevated erythrocyte sedimentation rate: Secondary | ICD-10-CM | POA: Diagnosis not present

## 2020-11-15 DIAGNOSIS — M545 Low back pain, unspecified: Secondary | ICD-10-CM

## 2020-11-15 DIAGNOSIS — R519 Headache, unspecified: Secondary | ICD-10-CM | POA: Diagnosis not present

## 2020-11-15 DIAGNOSIS — Z8669 Personal history of other diseases of the nervous system and sense organs: Secondary | ICD-10-CM

## 2020-11-15 DIAGNOSIS — I10 Essential (primary) hypertension: Secondary | ICD-10-CM | POA: Diagnosis not present

## 2020-11-15 MED ORDER — METOPROLOL SUCCINATE ER 25 MG PO TB24: 25.0000 mg | ORAL_TABLET | Freq: Every day | ORAL | 1 refills | Status: DC

## 2020-11-15 NOTE — Patient Instructions (Addendum)
Try to have xray of back tomorrow. Tylenol, stretches, range of motion ok for your back pain for now. If fever, or worsening symptoms - be seen right away.   I will discuss headache and sed rate with neurologist. If any changes in your headache or new symptoms be seen right away.   Return to the clinic or go to the nearest emergency room if any of your symptoms worsen or new symptoms occur.  Follow up to discuss elevated sed rate when you are back from vacation. Let me know if there are questions.   Acute Back Pain, Adult Acute back pain is sudden and usually short-lived. It is often caused by an injury to the muscles and tissues in the back. The injury may result from: A muscle or ligament getting overstretched or torn (strained). Ligaments are tissues that connect bones to each other. Lifting something improperly can cause a back strain. Wear and tear (degeneration) of the spinal disks. Spinal disks are circular tissue that provide cushioning between the bones of the spine (vertebrae). Twisting motions, such as while playing sports or doing yard work. A hit to the back. Arthritis. You may have a physical exam, lab tests, and imaging tests to find the cause ofyour pain. Acute back pain usually goes away with rest and home care. Follow these instructions at home: Managing pain, stiffness, and swelling Treatment may include medicines for pain and inflammation that are taken by mouth or applied to the skin, prescription pain medicine, or muscle relaxants. Take over-the-counter and prescription medicines only as told by your health care provider. Your health care provider may recommend applying ice during the first 24-48 hours after your pain starts. To do this: Put ice in a plastic bag. Place a towel between your skin and the bag. Leave the ice on for 20 minutes, 2-3 times a day. If directed, apply heat to the affected area as often as told by your health care provider. Use the heat source that  your health care provider recommends, such as a moist heat pack or a heating pad. Place a towel between your skin and the heat source. Leave the heat on for 20-30 minutes. Remove the heat if your skin turns bright red. This is especially important if you are unable to feel pain, heat, or cold. You have a greater risk of getting burned. Activity  Do not stay in bed. Staying in bed for more than 1-2 days can delay your recovery. Sit up and stand up straight. Avoid leaning forward when you sit or hunching over when you stand. If you work at a desk, sit close to it so you do not need to lean over. Keep your chin tucked in. Keep your neck drawn back, and keep your elbows bent at a 90-degree angle (right angle). Sit high and close to the steering wheel when you drive. Add lower back (lumbar) support to your car seat, if needed. Take short walks on even surfaces as soon as you are able. Try to increase the length of time you walk each day. Do not sit, drive, or stand in one place for more than 30 minutes at a time. Sitting or standing for long periods of time can put stress on your back. Do not drive or use heavy machinery while taking prescription pain medicine. Use proper lifting techniques. When you bend and lift, use positions that put less stress on your back: [place] your knees. Keep the load close to your body. Avoid twisting. Exercise regularly  as told by your health care provider. Exercising helps your back heal faster and helps prevent back injuries by keeping muscles strong and flexible. Work with a physical therapist to make a safe exercise program, as recommended by your health care provider. Do any exercises as told by your physical therapist.  Lifestyle Maintain a healthy weight. Extra weight puts stress on your back and makes it difficult to have good posture. Avoid activities or situations that make you feel anxious or stressed. Stress and anxiety increase muscle tension and can make  back pain worse. Learn ways to manage anxiety and stress, such as through exercise. General instructions Sleep on a firm mattress in a comfortable position. Try lying on your side with your knees slightly bent. If you lie on your back, put a pillow under your knees. Follow your treatment plan as told by your health care provider. This may include: Cognitive or behavioral therapy. Acupuncture or massage therapy. Meditation or yoga. Contact a health care provider if: You have pain that is not relieved with rest or medicine. You have increasing pain going down into your legs or buttocks. Your pain does not improve after 2 weeks. You have pain at night. You lose weight without trying. You have a fever or chills. Get help right away if: You develop new bowel or bladder control problems. You have unusual weakness or numbness in your arms or legs. You develop nausea or vomiting. You develop abdominal pain. You feel faint. Summary Acute back pain is sudden and usually short-lived. Use proper lifting techniques. When you bend and lift, use positions that put less stress on your back. Take over-the-counter and prescription medicines and apply heat or ice as directed by your health care provider. This information is not intended to replace advice given to you by your health care provider. Make sure you discuss any questions you have with your healthcare provider. Document Revised: 12/07/2019 Document Reviewed: 12/10/2019 Elsevier Patient Education  2022 [employer].

## 2020-11-15 NOTE — Progress Notes (Signed)
Subjective:  Patient ID: Mackenzie Everett, female    DOB: Sep 17, 1960  Age: 60 y.o. MRN: [id number]  CC:  Chief Complaint  Patient presents with   Back Pain    Pt does report intermittent pain no change in severity or quality, pt reports change in position can sometimes relieve some pressure.    Hypertension    Pt in need of refill metoprolol, denies physical sxs.      HPI MICHEAL SHEEN presents for   Follow-up for multiple concerns on August 11  Left-sided headache: Discussed August 11, suspected migraine versus less likely cluster.  Episodic headache 1 to 2/month.  No visual changes - less likely temporal arteritis  Sed rate was elevated but headache has resolved.  Prior sed rate 65 in March, recently higher at 94.  No headache since last visit No vision changes.  No fever, rash, abd pain, urinary symptoms, cough or new symptoms since last visit.  Did not need sumatriptan.  Will be traveling to [place] 8/20-25, then traveling in [place] and [place] until 8/29.  HA left sided primarily left side - behind eye, near temple, no vision changes. Tapers off after a few days.  No jaw pain. Similar headaches for a year.  [facility]   Right-sided low back pain Chronic low back pain off and on for years.  Discussed last visit, handout given on home treatment.  Lumbar spine imaging was ordered but not performed. No concerning symptoms at last visit.  Still some intermittent pain.  Positional.  Some increased soreness today. R low back. No radiation to leg. Stood more today - 45 min, more sore with prolonged standing. More bending today than usual.  No bowel or bladder incontinence, no saddle anesthesia, no lower extremity weakness.  No fever, weight loss or night sweats.  Upper back sore in muscles at times as well. No flu like symptoms.  Tx: rare tylenol.   Hypertension: Higher losartan HCTZ 50/12.5 mg restarted last visit.  Continued on metoprolol.  She did report some concerns  previously with the losartan possibly causing headache.  Home readings were 120-125/70-80 on meds, but no HA with restarting meds past 6 days.  Home readings: BP Readings from Last 3 Encounters:  11/15/20 134/84  11/09/20 138/82  07/04/20 130/82   Lab Results  Component Value Date   CREATININE 0.88 11/09/2020          History Patient Active Problem List   Diagnosis Date Noted   Hypertension 06/24/2020   Pre-op evaluation 06/22/2020   Hepatitis C 01/19/2016   Obesity, Class III, BMI 40-49.9 (morbid obesity) (HCC) 09/04/2011   Hyperlipidemia 09/04/2011   Migraine variant 09/04/2011   Asthma with allergic rhinitis 09/04/2011   Degenerative disc disease, lumbar 09/04/2011   Past Medical History:  Diagnosis Date   Allergy    Arthritis    hands and knees   Asthma    Hyperlipidemia 09/04/2011   Hypertension    Past Surgical History:  Procedure Laterality Date   APPENDECTOMY     BREAST CYST EXCISION Right 1980   BREAST SURGERY     KNEE SURGERY     1994   TUBAL LIGATION     Allergies  Allergen Reactions   Caffeine Other (See Comments)    migraines   Penicillins Hives   Prior to Admission medications   Medication Sig Start Date End Date Taking? Authorizing Provider  losartan-hydrochlorothiazide (HYZAAR) 50-12.5 MG tablet Take 1 tablet by mouth daily.  11/09/20  Yes Shade Flood, MD  metoprolol succinate (TOPROL-XL) 25 MG 24 hr tablet TAKE 1 TABLET BY MOUTH EVERY DAY 10/04/20  Yes Shade Flood, MD  SUMAtriptan (IMITREX) 25 MG tablet Take 1 tablet (25 mg total) by mouth once for 1 dose. May repeat once in 2 hours if headache persists or recurs. 11/09/20 11/09/20  Shade Flood, MD   Social History   Socioeconomic History   Marital status: Single    Spouse name: Not on file   Number of children: 4   Years of education: Not on file   Highest education level: Not on file  Occupational History   Not on file  Tobacco Use   Smoking status: Never   Smokeless  tobacco: Never  Vaping Use   Vaping Use: Never used  Substance and Sexual Activity   Alcohol use: Yes    Comment: social   Drug use: No   Sexual activity: Yes    Birth control/protection: Post-menopausal  Other Topics Concern   Not on file  Social History Narrative   Lives at home alone   Social Determinants of Health   Financial Resource Strain: Not on file  Food Insecurity: Not on file  Transportation Needs: Not on file  Physical Activity: Not on file  Stress: Not on file  Social Connections: Not on file  Intimate Partner Violence: Not on file    Review of Systems Per hpi   Objective:   Vitals:   11/15/20 1603  BP: 134/84  Pulse: 95  Resp: 17  Temp: 98.2 F (36.8 C)  TempSrc: Temporal  SpO2: 96%  Weight: (!) 325 lb 9.6 oz (147.7 kg)  Height:  (1.778 m)     Physical Exam Vitals reviewed.  Constitutional:      General: She is not in acute distress.    Appearance: Normal appearance. She is well-developed. She is not ill-appearing, toxic-appearing or diaphoretic.  HENT:     Head: Normocephalic and atraumatic.     Comments: Temporal area nontender with no nodules palpated.  No current headache, denies jaw pain.  Pain-free TMJ. Eyes:     Conjunctiva/sclera: Conjunctivae normal.     Pupils: Pupils are equal, round, and reactive to light.  Neck:     Vascular: No carotid bruit.  Cardiovascular:     Rate and Rhythm: Normal rate and regular rhythm.     Heart sounds: Normal heart sounds.     Comments: Equal radial pulses Pulmonary:     Effort: Pulmonary effort is normal.     Breath sounds: Normal breath sounds.  Abdominal:     Palpations: Abdomen is soft. There is no pulsatile mass.     Tenderness: There is no abdominal tenderness.  Musculoskeletal:     Right lower leg: No edema.     Left lower leg: No edema.     Comments: Thoracic spine, no midline bony tenderness.  Locates area of discomfort to the paraspinals with no appreciable spasm.  Lumbar  spine no focal midline bony tenderness, locates area of discomfort to the lower paraspinals, low mid back diffuse.  Pain-free range of motion, ambulates without difficulty.       Skin:    General: Skin is warm and dry.  Neurological:     Mental Status: She is alert and oriented to person, place, and time.  Psychiatric:        Mood and Affect: Mood normal.        Behavior: Behavior normal.  Assessment & Plan:  Lesly DukesKaren R Hemphill is a 60 y.o. female . History of migraine Temporal headache Elevated sed rate  -Recurrent headaches as above.  Longstanding symptoms, possible migraine variant previously.  Temporal or retroorbital but without any visual symptoms, no amaurosis, no visual changes, no jaw claudication symptoms, no other apparent vascular symptoms to suggest temporal arteritis.  Does have elevated sed rate, higher recently than in March.  Asymptomatic at present.  Has plan for follow-up with headache specialist.  Will refer for temporal artery biopsy.am Discussed need for high-dose prednisone if any change or worsening headache, visual symptoms or jaw claudication.  She declined prescription for her upcoming trip, but stressed importance of immediate medical eval if any new or worsening symptoms with her headache, or more severe headache.  Understanding expressed.  Essential hypertension - Plan: metoprolol succinate (TOPROL-XL) 25 MG 24 hr tablet  -Discussed last visit, refilled Toprol as well.  Right-sided low back pain without sciatica, unspecified chronicity  -Some flaring symptoms likely due to increased activity at work.  No red flags on exam.  Consider advanced imaging depending on x-ray and persistent symptoms given underlying sed rate.  Plan on follow-up after her vacation to discuss elevated sed rate further but denies current signs or symptoms of infection.  Again ER precautions were given.  Meds ordered this encounter  Medications   metoprolol succinate (TOPROL-XL) 25 MG  24 hr tablet    Sig: Take 1 tablet (25 mg total) by mouth daily.    Dispense:  90 tablet    Refill:  1   Patient Instructions  Try to have xray of back tomorrow. Tylenol, stretches, range of motion ok for your back pain for now. If fever, or worsening symptoms - be seen right away.   I will discuss headache and sed rate with neurologist. If any changes in your headache or new symptoms be seen right away.   Return to the clinic or go to the nearest emergency room if any of your symptoms worsen or new symptoms occur.  Follow up to discuss elevated sed rate when you are back from vacation. Let me know if there are questions.   Acute Back Pain, Adult Acute back pain is sudden and usually short-lived. It is often caused by an injury to the muscles and tissues in the back. The injury may result from: A muscle or ligament getting overstretched or torn (strained). Ligaments are tissues that connect bones to each other. Lifting something improperly can cause a back strain. Wear and tear (degeneration) of the spinal disks. Spinal disks are circular tissue that provide cushioning between the bones of the spine (vertebrae). Twisting motions, such as while playing sports or doing yard work. A hit to the back. Arthritis. You may have a physical exam, lab tests, and imaging tests to find the cause ofyour pain. Acute back pain usually goes away with rest and home care. Follow these instructions at home: Managing pain, stiffness, and swelling Treatment may include medicines for pain and inflammation that are taken by mouth or applied to the skin, prescription pain medicine, or muscle relaxants. Take over-the-counter and prescription medicines only as told by your health care provider. Your health care provider may recommend applying ice during the first 24-48 hours after your pain starts. To do this: Put ice in a plastic bag. Place a towel between your skin and the bag. Leave the ice on for 20 minutes,  2-3 times a day. If directed, apply heat to the affected  area as often as told by your health care provider. Use the heat source that your health care provider recommends, such as a moist heat pack or a heating pad. Place a towel between your skin and the heat source. Leave the heat on for 20-30 minutes. Remove the heat if your skin turns bright red. This is especially important if you are unable to feel pain, heat, or cold. You have a greater risk of getting burned. Activity  Do not stay in bed. Staying in bed for more than 1-2 days can delay your recovery. Sit up and stand up straight. Avoid leaning forward when you sit or hunching over when you stand. If you work at a desk, sit close to it so you do not need to lean over. Keep your chin tucked in. Keep your neck drawn back, and keep your elbows bent at a 90-degree angle (right angle). Sit high and close to the steering wheel when you drive. Add lower back (lumbar) support to your car seat, if needed. Take short walks on even surfaces as soon as you are able. Try to increase the length of time you walk each day. Do not sit, drive, or stand in one place for more than 30 minutes at a time. Sitting or standing for long periods of time can put stress on your back. Do not drive or use heavy machinery while taking prescription pain medicine. Use proper lifting techniques. When you bend and lift, use positions that put less stress on your back: [place] your knees. Keep the load close to your body. Avoid twisting. Exercise regularly as told by your health care provider. Exercising helps your back heal faster and helps prevent back injuries by keeping muscles strong and flexible. Work with a physical therapist to make a safe exercise program, as recommended by your health care provider. Do any exercises as told by your physical therapist.  Lifestyle Maintain a healthy weight. Extra weight puts stress on your back and makes it difficult to have good  posture. Avoid activities or situations that make you feel anxious or stressed. Stress and anxiety increase muscle tension and can make back pain worse. Learn ways to manage anxiety and stress, such as through exercise. General instructions Sleep on a firm mattress in a comfortable position. Try lying on your side with your knees slightly bent. If you lie on your back, put a pillow under your knees. Follow your treatment plan as told by your health care provider. This may include: Cognitive or behavioral therapy. Acupuncture or massage therapy. Meditation or yoga. Contact a health care provider if: You have pain that is not relieved with rest or medicine. You have increasing pain going down into your legs or buttocks. Your pain does not improve after 2 weeks. You have pain at night. You lose weight without trying. You have a fever or chills. Get help right away if: You develop new bowel or bladder control problems. You have unusual weakness or numbness in your arms or legs. You develop nausea or vomiting. You develop abdominal pain. You feel faint. Summary Acute back pain is sudden and usually short-lived. Use proper lifting techniques. When you bend and lift, use positions that put less stress on your back. Take over-the-counter and prescription medicines and apply heat or ice as directed by your health care provider. This information is not intended to replace advice given to you by your health care provider. Make sure you discuss any questions you have with your healthcare provider. Document  Revised: 12/07/2019 Document Reviewed: 12/10/2019 Elsevier Patient Education  2022 Elsevier Inc.    Signed,   Meredith Staggers, MD [place] [facility], [facility] 11/16/20 1:10 PM

## 2020-11-16 ENCOUNTER — Ambulatory Visit (INDEPENDENT_AMBULATORY_CARE_PROVIDER_SITE_OTHER)
Admission: RE | Admit: 2020-11-16 | Discharge: 2020-11-16 | Disposition: A | Discharge status: Home / Self Care | Payer: BC Managed Care – PPO | Source: Ambulatory Visit | Attending: Family Medicine | Admitting: Family Medicine

## 2020-11-16 DIAGNOSIS — M545 Low back pain, unspecified: Secondary | ICD-10-CM | POA: Diagnosis not present

## 2020-12-06 ENCOUNTER — Other Ambulatory Visit: Payer: Self-pay | Admitting: Specialist

## 2020-12-06 DIAGNOSIS — G43009 Migraine without aura, not intractable, without status migrainosus: Secondary | ICD-10-CM

## 2020-12-22 ENCOUNTER — Ambulatory Visit: Admit: 2020-12-22 | Primary: Physician

## 2020-12-22 DIAGNOSIS — Z23 Encounter for immunization: Secondary | ICD-10-CM

## 2020-12-26 ENCOUNTER — Other Ambulatory Visit: Payer: Self-pay

## 2020-12-26 ENCOUNTER — Ambulatory Visit
Admission: RE | Admit: 2020-12-26 | Discharge: 2020-12-26 | Disposition: A | Discharge status: Home / Self Care | Payer: BC Managed Care – PPO | Source: Ambulatory Visit | Attending: Specialist | Admitting: Specialist

## 2020-12-26 DIAGNOSIS — G43009 Migraine without aura, not intractable, without status migrainosus: Secondary | ICD-10-CM

## 2020-12-26 MED ORDER — GADOBENATE DIMEGLUMINE 529 MG/ML IV SOLN
20.0000 mL | Freq: Once | INTRAVENOUS | Status: AC | PRN
  Administered 2020-12-26: 20 mL via INTRAVENOUS

## 2021-01-08 ENCOUNTER — Other Ambulatory Visit: Payer: Self-pay | Admitting: Surgery

## 2021-01-22 NOTE — Patient Instructions (Addendum)
DUE TO COVID-19 ONLY ONE VISITOR IS ALLOWED TO COME WITH YOU AND STAY IN THE WAITING ROOM ONLY DURING PRE OP AND PROCEDURE DAY OF SURGERY                Mackenzie Everett     Your procedure is scheduled on: 01/31/21   Report to [facility] Main  Entrance   Report to admitting at  7:45 AM     Call this number if you have problems the morning of surgery [phone]    Remember: Do not eat food  :After Midnight the night before your surgery,     You may have clear liquids from midnight until 7:00 am    CLEAR LIQUID DIET   Foods Allowed                                                                     Foods Excluded                                                                                     liquids that you cannot  Plain Jell-O any favor except red or purple                                           see through such as: Fruit ices (not with fruit pulp)                                     milk, soups, orange juice  Iced Popsicles                                    All solid food Carbonated beverages, regular and diet                                    Cranberry, grape and apple juices Sports drinks like Gatorade Lightly seasoned clear broth or consume(fat free) Sugar    BRUSH YOUR TEETH MORNING OF SURGERY AND RINSE YOUR MOUTH OUT, NO CHEWING GUM CANDY OR MINTS.    Take these medicines the morning of surgery with A SIP OF WATER: none                                You may not have any metal on your body including hair pins and              piercings  Do not wear jewelry, make-up, lotions, powders or perfumes, deodorant  Do not wear nail polish on your fingernails.  Do not shave  48 hours prior to surgery.                 Do not bring valuables to the hospital. [facility] IS NOT             RESPONSIBLE   FOR VALUABLES.  Contacts, dentures or bridgework may not be worn into surgery.       Patients discharged the day of surgery will not be allowed  to drive home.  IF YOU ARE HAVING SURGERY AND GOING HOME THE SAME DAY, YOU MUST HAVE AN ADULT TO DRIVE YOU HOME AND BE WITH YOU FOR 24 HOURS. YOU MAY GO HOME BY TAXI OR UBER OR ORTHERWISE, BUT AN ADULT MUST ACCOMPANY YOU HOME AND STAY WITH YOU FOR 24 HOURS.  Name and phone number of your driver:  Special Instructions: N/A              Please read over the following fact sheets you were given: _____________________________________________________________________             [facility] - Preparing for Surgery Before surgery, you can play an important role.  Because skin is not sterile, your skin needs to be as free of germs as possible.  You can reduce the number of germs on your skin by washing with CHG (chlorahexidine gluconate) soap before surgery.  CHG is an antiseptic cleaner which kills germs and bonds with the skin to continue killing germs even after washing. Please DO NOT use if you have an allergy to CHG or antibacterial soaps.  If your skin becomes reddened/irritated stop using the CHG and inform your nurse when you arrive at Short Stay. Do not shave (including legs and underarms) for at least 48 hours prior to the first CHG shower.  Please follow these instructions carefully:  1.  Shower with CHG Soap the night before surgery and the  morning of Surgery.  2.  If you choose to wash your hair, wash your hair first as usual with your  normal  shampoo.  3.  After you shampoo, rinse your hair and body thoroughly to remove the  shampoo.                            4.  Use CHG as you would any other liquid soap.  You can apply chg directly  to the skin and wash                       Gently with a scrungie or clean washcloth.  5.  Apply the CHG Soap to your body ONLY FROM THE NECK DOWN.   Do not use on face/ open                           Wound or open sores. Avoid contact with eyes, ears mouth and genitals (private parts).                       Wash face,  Genitals (private parts) with your  normal soap.             6.  Wash thoroughly, paying special attention to the area where your surgery  will be performed.  7.  Thoroughly rinse your body with warm water from the neck down.  8.  DO NOT shower/wash with your normal soap after using and rinsing off  the CHG Soap.                9.  Pat yourself dry with a clean towel.            10.  Wear clean pajamas.            11.  Place clean sheets on your bed the night of your first shower and do not  sleep with pets. Day of Surgery : Do not apply any lotions/deodorants the morning of surgery.  Please wear clean clothes to the hospital/surgery center.  FAILURE TO FOLLOW THESE INSTRUCTIONS MAY RESULT IN THE CANCELLATION OF YOUR SURGERY PATIENT SIGNATURE_________________________________  NURSE SIGNATURE__________________________________  ________________________________________________________________________

## 2021-01-23 ENCOUNTER — Other Ambulatory Visit: Payer: Self-pay

## 2021-01-23 ENCOUNTER — Encounter (HOSPITAL_COMMUNITY): Payer: Self-pay

## 2021-01-23 ENCOUNTER — Encounter (HOSPITAL_COMMUNITY)
Admission: RE | Admit: 2021-01-23 | Discharge: 2021-01-23 | Disposition: A | Discharge status: Home / Self Care | Payer: BC Managed Care – PPO | Source: Ambulatory Visit | Attending: Surgery | Admitting: Surgery

## 2021-01-23 VITALS — BP 137/71 | HR 58 | Temp 98.9°F | Resp 18 | Ht 70.0 in | Wt 324.0 lb

## 2021-01-23 DIAGNOSIS — Z79899 Other long term (current) drug therapy: Secondary | ICD-10-CM | POA: Insufficient documentation

## 2021-01-23 DIAGNOSIS — R519 Headache, unspecified: Secondary | ICD-10-CM | POA: Diagnosis not present

## 2021-01-23 DIAGNOSIS — Z01812 Encounter for preprocedural laboratory examination: Secondary | ICD-10-CM | POA: Diagnosis not present

## 2021-01-23 DIAGNOSIS — E785 Hyperlipidemia, unspecified: Secondary | ICD-10-CM | POA: Insufficient documentation

## 2021-01-23 DIAGNOSIS — I10 Essential (primary) hypertension: Secondary | ICD-10-CM | POA: Diagnosis not present

## 2021-01-23 DIAGNOSIS — J45909 Unspecified asthma, uncomplicated: Secondary | ICD-10-CM | POA: Insufficient documentation

## 2021-01-23 LAB — CBC
HCT: 37.5 % (ref 36.0–46.0)
Hemoglobin: 11.5 g/dL — ABNORMAL LOW (ref 12.0–15.0)
MCH: 25.3 pg — ABNORMAL LOW (ref 26.0–34.0)
MCHC: 30.7 g/dL (ref 30.0–36.0)
MCV: 82.6 fL (ref 80.0–100.0)
Platelets: 295 10*3/uL (ref 150–400)
RBC: 4.54 MIL/uL (ref 3.87–5.11)
RDW: 15.6 % — ABNORMAL HIGH (ref 11.5–15.5)
WBC: 9.1 10*3/uL (ref 4.0–10.5)
nRBC: 0 % (ref 0.0–0.2)

## 2021-01-23 LAB — BASIC METABOLIC PANEL
Anion gap: 7 (ref 5–15)
BUN: 19 mg/dL (ref 8–23)
CO2: 23 mmol/L (ref 22–32)
Calcium: 9.3 mg/dL (ref 8.9–10.3)
Chloride: 109 mmol/L (ref 98–111)
Creatinine, Ser: 0.92 mg/dL (ref 0.44–1.00)
GFR, Estimated: >60 mL/min (ref >60)
Glucose, Bld: 91 mg/dL (ref 70–99)
Potassium: 4.7 mmol/L (ref 3.5–5.1)
Sodium: 139 mmol/L (ref 135–145)

## 2021-01-23 NOTE — Progress Notes (Signed)
COVID test- NA    PCP - Dr. Herma Carson Cardiologist - none  Chest x-ray - no EKG - 06/22/20-epic Stress Test - no ECHO - 07/09/20-epic Cardiac Cath - no Pacemaker/ICD device last checked:NA  Sleep Study - yes CPAP - no  Fasting Blood Sugar - NA Checks Blood Sugar _____ times a day  Blood Thinner Instructions:NA Aspirin Instructions: Last Dose:  Anesthesia review: no  Patient denies shortness of breath, fever, cough and chest pain at PAT appointment Pt's BMI is 46.4 . She gets SOB climbing 2 flights of stairs but not with doing housework or with ADLs.  Patient verbalized understanding of instructions that were given to them at the PAT appointment. Patient was also instructed that they will need to review over the PAT instructions again at home before surgery. yes

## 2021-01-24 NOTE — Anesthesia Preprocedure Evaluation (Addendum)
Anesthesia Evaluation  Patient identified by MRN, date of birth, ID band Patient awake    Reviewed: Allergy & Precautions, NPO status , Patient's Chart, lab work & pertinent test results  Airway Mallampati: II  TM Distance: >3 FB Neck ROM: Full    Dental  (+) Teeth Intact, Dental Advisory Given   Pulmonary asthma ,    breath sounds clear to auscultation       Cardiovascular hypertension, Pt. on medications and Pt. on home beta blockers  Rhythm:Regular Rate:Normal     Neuro/Psych  Headaches, negative psych ROS   GI/Hepatic negative GI ROS, (+) Hepatitis -  Endo/Other  negative endocrine ROS  Renal/GU negative Renal ROS     Musculoskeletal  (+) Arthritis ,   Abdominal (+) + obese,   Peds  Hematology negative hematology ROS (+)   Anesthesia Other Findings   Reproductive/Obstetrics                           Anesthesia Physical Anesthesia Plan  ASA: 3  Anesthesia Plan: MAC   Post-op Pain Management:    Induction: Intravenous  PONV Risk Score and Plan: 3 and Ondansetron, Midazolam and Propofol infusion  Airway Management Planned:   Additional Equipment: None  Intra-op Plan:   Post-operative Plan:   Informed Consent: I have reviewed the patients History and Physical, chart, labs and discussed the procedure including the risks, benefits and alternatives for the proposed anesthesia with the patient or authorized representative who has indicated his/her understanding and acceptance.     Dental advisory given  Plan Discussed with: CRNA  Anesthesia Plan Comments: (See PAT note 01/23/2021, Konrad Felix Ward, PA-C)      Anesthesia Quick Evaluation

## 2021-01-24 NOTE — Progress Notes (Signed)
Anesthesia Chart Review   Case: [id number] Date/Time: 01/31/21 0945   Procedure: LEFT TEMPORAL ARTERY BIOPSY (Left)   Anesthesia type: Monitor Anesthesia Care   Pre-op diagnosis: LEFT SIDED HEADACHES, RULE OUT TEMPORAL ARTERITIS   Location: [facility] ROOM 04 / [facility]   Surgeons: Abigail Miyamoto, MD       DISCUSSION:60 y.o. never smoker with h/o asthma, HTN, left sided headaches scheduled for above procedure 01/31/21 with Dr. Abigail Miyamoto.   Pt seen by cardiology 06/22/2020. Per OV note, "Preoperative stratification: Previously low risk exercise treadmill stress test in 2019. Will obtain echocardiogram. She does have hyperlipidemia which will need management after surgery.  Overall, low perioperative cardiac risk."  Echo 07/04/2020 with EF 59 %, no valvular abnormalities.   Anticipate pt can proceed with planned procedure barring acute status change.   VS: BP 137/71   Pulse (!) 58   Temp 37.2 C (Oral)   Resp 18   Ht 5\' 10"  (1.778 m)   Wt (!) 147 kg   SpO2 100%   BMI 46.49 kg/m   PROVIDERS: , MD is PCP   Shade Flood, MD is Cardiologist  LABS: Labs reviewed: Acceptable for surgery. (all labs ordered are listed, but only abnormal results are displayed)  Labs Reviewed  CBC - Abnormal; Notable for the following components:      Result Value   Hemoglobin 11.5 (*)    MCH 25.3 (*)    RDW 15.6 (*)    All other components within normal limits  BASIC METABOLIC PANEL     IMAGES:   EKG: EKG 06/22/2020: Sinus rhythm 77 bpm Borderline LVH  CV: Echocardiogram 07/04/2020:  Left ventricle cavity is normal in size and wall thickness. Normal global  wall motion. Normal LV systolic function with EF 59%. Normal diastolic  filling pattern.  No significant valvular abnormality.  No evidence of pulmonary hypertension.  No significant change compared to previous study on 07/29/2017. Past Medical History:  Diagnosis Date   Allergy    Arthritis    hands and knees    Asthma    exercise induced   Hyperlipidemia 09/04/2011   Hypertension     Past Surgical History:  Procedure Laterality Date   APPENDECTOMY  1998   BREAST CYST EXCISION Right 1980   KNEE ARTHROSCOPY Right 06/2020   KNEE SURGERY Left 1994   TUBAL LIGATION  1992    MEDICATIONS:  baclofen (LIORESAL) 10 MG tablet   fluticasone (FLONASE) 50 MCG/ACT nasal spray   losartan-hydrochlorothiazide (HYZAAR) 50-12.5 MG tablet   metoprolol succinate (TOPROL-XL) 25 MG 24 hr tablet   SUMAtriptan (IMITREX) 25 MG tablet   zonisamide (ZONEGRAN) 25 MG capsule   No current facility-administered medications for this encounter.     07/2020 Ward, PA-C [facility] Pre-Surgical Testing [phone]

## 2021-01-29 NOTE — Progress Notes (Signed)
Established Patient Office Visit  Subjective:  Patient ID: Mackenzie Everett, female    DOB: 1960/10/22  Age: 60 y.o. MRN: [id number]  CC:  Chief Complaint  Patient presents with   Knee Pain    Pt here today for pre operative clearance for Rt knee surgery 07/21/2020 scheduled, seeing cards today for Echo    HPI Mackenzie Everett presents for preoperative examination  No acute concerns.  R knee surgery on 07/21/20  Seeing cardiology today for cardiac clearance.  Past Medical History:  Diagnosis Date   Allergy    Arthritis    hands and knees   Asthma    exercise induced   Hyperlipidemia 09/04/2011   Hypertension     Past Surgical History:  Procedure Laterality Date   APPENDECTOMY  1998   BREAST CYST EXCISION Right 1980   KNEE ARTHROSCOPY Right 06/2020   KNEE SURGERY Left 1994   TUBAL LIGATION  1992    Family History  Problem Relation Age of Onset   Arthritis Mother    Diabetes Mother    Hyperlipidemia Mother    Hypertension Mother    Hyperlipidemia Sister    Hypertension Sister    Lupus Sister    Diabetes Maternal Grandmother    Heart disease Maternal Grandfather    Heart disease Paternal Grandfather    Cancer Maternal Aunt     Social History   Socioeconomic History   Marital status: Single    Spouse name: Not on file   Number of children: 4   Years of education: Not on file   Highest education level: Not on file  Occupational History   Not on file  Tobacco Use   Smoking status: Never   Smokeless tobacco: Never  Vaping Use   Vaping Use: Never used  Substance and Sexual Activity   Alcohol use: Yes    Comment: social   Drug use: No   Sexual activity: Yes    Birth control/protection: Post-menopausal  Other Topics Concern   Not on file  Social History Narrative   Lives at home alone   Social Determinants of Health   Financial Resource Strain: Not on file  Food Insecurity: Not on file  Transportation Needs: Not on file  Physical Activity: Not on  file  Stress: Not on file  Social Connections: Not on file  Intimate Partner Violence: Not on file    Outpatient Medications Prior to Visit  Medication Sig Dispense Refill   HYDROcodone-acetaminophen (NORCO/VICODIN) 5-325 MG tablet Take 1 tablet by mouth as needed. (Patient not taking: No sig reported)     losartan-hydrochlorothiazide (HYZAAR) 50-12.5 MG tablet Take 1 tablet by mouth daily. 90 tablet 3   methocarbamol (ROBAXIN) 500 MG tablet Take 1 tablet (500 mg total) by mouth 4 (four) times daily. (Patient not taking: No sig reported) 60 tablet 0   metoprolol succinate (TOPROL-XL) 25 MG 24 hr tablet TAKE 1 TABLET BY MOUTH EVERY DAY 90 tablet 0   traMADol (ULTRAM) 50 MG tablet Take 50-100 mg by mouth 2 (two) times daily as needed. (Patient not taking: No sig reported)     No facility-administered medications prior to visit.    Allergies  Allergen Reactions   Caffeine Other (See Comments)    migraines   Penicillins Hives    ROS Review of Systems  Constitutional: Negative.   HENT: Negative.    Eyes: Negative.   Respiratory: Negative.    Cardiovascular: Negative.   Gastrointestinal: Negative.   Genitourinary: Negative.  Musculoskeletal: Negative.   Skin: Negative.   Neurological: Negative.   Psychiatric/Behavioral: Negative.    All other systems reviewed and are negative.    Objective:    Physical Exam Vitals and nursing note reviewed.  Constitutional:      General: She is not in acute distress.    Appearance: Normal appearance. She is normal weight. She is not ill-appearing, toxic-appearing or diaphoretic.  Cardiovascular:     Rate and Rhythm: Normal rate and regular rhythm.     Heart sounds: Normal heart sounds. No murmur heard.   No friction rub. No gallop.  Pulmonary:     Effort: Pulmonary effort is normal. No respiratory distress.     Breath sounds: Normal breath sounds. No stridor. No wheezing, rhonchi or rales.  Chest:     Chest wall: No tenderness.   Skin:    General: Skin is warm and dry.  Neurological:     General: No focal deficit present.     Mental Status: She is alert and oriented to person, place, and time. Mental status is at baseline.  Psychiatric:        Mood and Affect: Mood normal.        Behavior: Behavior normal.        Thought Content: Thought content normal.        Judgment: Judgment normal.    BP 130/82   Pulse 75   Temp 98.1 F (36.7 C) (Temporal)   Resp 17   Ht 5\' 10"  (1.778 m)   Wt (!) 329 lb 12.8 oz (149.6 kg)   SpO2 98%   BMI 47.32 kg/m  Wt Readings from Last 3 Encounters:  01/23/21 (!) 324 lb (147 kg)  11/15/20 (!) 325 lb 9.6 oz (147.7 kg)  11/09/20 (!) 330 lb 14.4 oz (150.1 kg)     Health Maintenance Due  Topic Date Due   Pneumococcal Vaccine 12-74 Years old (1 - PCV) Never done   COLONOSCOPY (Pts 45-11yrs Insurance coverage will need to be confirmed)  Never done   COVID-19 Vaccine (5 - Booster for Pfizer series) 10/24/2020   INFLUENZA VACCINE  10/30/2020    There are no preventive care reminders to display for this patient.  Lab Results  Component Value Date   TSH 0.61 07/04/2020   Lab Results  Component Value Date   WBC 9.1 01/23/2021   HGB 11.5 (L) 01/23/2021   HCT 37.5 01/23/2021   MCV 82.6 01/23/2021   PLT 295 01/23/2021   Lab Results  Component Value Date   NA 139 01/23/2021   K 4.7 01/23/2021   CO2 23 01/23/2021   GLUCOSE 91 01/23/2021   BUN 19 01/23/2021   CREATININE 0.92 01/23/2021   BILITOT 0.4 11/09/2020   ALKPHOS 71 11/09/2020   AST 10 11/09/2020   ALT 9 11/09/2020   PROT 7.0 11/09/2020   ALBUMIN 4.3 11/09/2020   CALCIUM 9.3 01/23/2021   ANIONGAP 7 01/23/2021   GFR 70.48 11/09/2020   Lab Results  Component Value Date   CHOL 228 (H) 11/09/2020   Lab Results  Component Value Date   HDL 50.20 11/09/2020   Lab Results  Component Value Date   LDLCALC 153 (H) 11/09/2020   Lab Results  Component Value Date   TRIG 125.0 11/09/2020   Lab Results   Component Value Date   CHOLHDL 5 11/09/2020   Lab Results  Component Value Date   HGBA1C 6.3 11/09/2020      Assessment & Plan:  Problem List Items Addressed This Visit   None Visit Diagnoses     Preoperative examination    -  Primary   Relevant Orders   TSH (Completed)   CBC with Differential/Platelet (Completed)   Urinalysis (Completed)   Protime-INR (Completed)   Hemoglobin A1c (Completed)       No orders of the defined types were placed in this encounter.   Follow-up: Return if symptoms worsen or fail to improve.   PLAN Labs as above. Follow up as warranted No findings on exam that would preclude operation Patient encouraged to call clinic with any questions, comments, or concerns.  Janeece Agee, NP

## 2021-01-30 NOTE — H&P (Signed)
REFERRING PHYSICIAN: Shade Flood, MD  PROVIDER: Wayne Both, MD  MRN: [id number] DOB: 07-19-1960 DATE OF ENCOUNTER: 01/08/2021 Subjective  Chief Complaint: New Consultation (Temporal Artery biopsy )   History of Present Illness: Mackenzie Everett is a 60 y.o. female who is seen today as an office consultation at the request of Dr. Neva Seat for evaluation of New Consultation (Temporal Artery biopsy ) .   This is a pleasant 60 year old female referred here for consideration of a temporal artery biopsy. She has been having headaches for several months mostly on the left side with some visual changes. This is been on the left eye. She has had an elevated sedimentation rate. She is currently on migraine medications and this have helped some. Her primary care provider and neurologist have recommended temporal artery biopsy for complete evaluation to rule out arteritis. She is otherwise without complaints. She has no current cardiopulmonary issues.  Review of Systems: A complete review of systems was obtained from the patient. I have reviewed this information and discussed as appropriate with the patient. See HPI as well for other ROS.  ROS   Medical History: Past Medical History:  Diagnosis Date   Arthritis   Asthma, unspecified asthma severity, unspecified whether complicated, unspecified whether persistent   Hyperlipidemia   Hypertension   Patient Active Problem List  Diagnosis   Asthma with allergic rhinitis, unspecified, unspecified   Degenerative disc disease, lumbar   Hepatitis C   Hyperlipidemia   Hypertension   Migraine variant   Obesity, Class III, BMI 40-49.9 (morbid obesity) (CMS-HCC)   Pre-op evaluation   Past Surgical History:  Procedure Laterality Date   APPENDECTOMY 1986   Breast Surgery 1980  Cyst Removed   Endo Ablation Surgery 2010   ESSURE TUBAL LIGATION 1992   KNEE ARTHROSCOPY Bilateral  rt-2002 and lt-1994    Allergies  Allergen Reactions    Caffeine Other (See Comments)  migraines migraines   Penicillins Hives   Current Outpatient Medications on File Prior to Visit  Medication Sig Dispense Refill   baclofen (LIORESAL) 10 MG tablet TAKE 1 TABLET TWICE A DAY AS NEEDED LIMIT 1-2 TREATMENTS PER WEEK   losartan-hydrochlorothiazide (HYZAAR) 50-12.5 mg tablet Take 1 tablet by mouth once daily   metoprolol succinate (TOPROL-XL) 25 MG XL tablet Take 25 mg by mouth once daily   SUMAtriptan (IMITREX) 25 MG tablet TAKE 1 TABLET BY MOUTH ONCE FOR 1 DOSE. MAY REPEAT ONCE IN 2 HOURS IF HEADACHE PERSISTS OR RECURS.   zonisamide (ZONEGRAN) 25 MG capsule TAKE 4 CAPSULES BY MOUTH EACH NIGHT   No current facility-administered medications on file prior to visit.   History reviewed. No pertinent family history.   Social History   Tobacco Use  Smoking Status Never Smoker  Smokeless Tobacco Never Used    Social History   Socioeconomic History   Marital status: Single  Tobacco Use   Smoking status: Never Smoker   Smokeless tobacco: Never Used  Building services engineer Use: Never used  Substance and Sexual Activity   Alcohol use: Yes   Drug use: Never   Objective:   Vitals:  01/08/21 1629  BP: 132/78  Pulse: 92  Temp: 36.4 C (97.6 F)  SpO2: 96%  Weight: (!) 149.8 kg (330 lb 3.2 oz)  Height: 177.8 cm (5\' 10" )   Body mass index is 47.38 kg/m.  Physical Exam   She appears well on exam today  She has a very weakly palpable left temporal artery.  Lungs clear  CV RRR  Abdomen soft, NT  Labs, Imaging and Diagnostic Testing:  Assessment and Plan:    Temporal arteritis (CMS-HCC)     I have reviewed her notes in the electronic medical records. Again, given her headaches and elevated sedimentation rate, a temporal artery biopsy has been recommended. I discussed the surgical procedure with her in detail. We discussed the risk which includes but is not limited to bleeding, infection, injury to surrounding structures,  postoperative recovery, etc. We also discussed the possibility that this is nondiagnostic. She understands and wished to proceed with surgery which will be scheduled.

## 2021-01-31 ENCOUNTER — Ambulatory Visit (HOSPITAL_COMMUNITY): Payer: BC Managed Care – PPO | Admitting: Physician Assistant

## 2021-01-31 ENCOUNTER — Encounter (HOSPITAL_COMMUNITY): Payer: Self-pay | Admitting: Surgery

## 2021-01-31 ENCOUNTER — Ambulatory Visit (HOSPITAL_COMMUNITY)
Admission: RE | Admit: 2021-01-31 | Discharge: 2021-01-31 | Disposition: A | Discharge status: Home / Self Care | Payer: BC Managed Care – PPO | Attending: Surgery | Admitting: Surgery

## 2021-01-31 ENCOUNTER — Ambulatory Visit (HOSPITAL_COMMUNITY): Payer: BC Managed Care – PPO | Admitting: Certified Registered Nurse Anesthetist

## 2021-01-31 ENCOUNTER — Encounter (HOSPITAL_COMMUNITY)
Admission: RE | Disposition: A | Discharge status: Home / Self Care | Payer: Self-pay | Source: Home / Self Care | Attending: Surgery

## 2021-01-31 DIAGNOSIS — R700 Elevated erythrocyte sedimentation rate: Secondary | ICD-10-CM | POA: Insufficient documentation

## 2021-01-31 DIAGNOSIS — Z79899 Other long term (current) drug therapy: Secondary | ICD-10-CM | POA: Insufficient documentation

## 2021-01-31 DIAGNOSIS — Z880 Allergy status to penicillin: Secondary | ICD-10-CM | POA: Insufficient documentation

## 2021-01-31 DIAGNOSIS — I10 Essential (primary) hypertension: Secondary | ICD-10-CM | POA: Insufficient documentation

## 2021-01-31 DIAGNOSIS — R519 Headache, unspecified: Secondary | ICD-10-CM | POA: Diagnosis present

## 2021-01-31 DIAGNOSIS — Z6841 Body Mass Index (BMI) 40.0 and over, adult: Secondary | ICD-10-CM | POA: Insufficient documentation

## 2021-01-31 DIAGNOSIS — E785 Hyperlipidemia, unspecified: Secondary | ICD-10-CM | POA: Diagnosis not present

## 2021-01-31 HISTORY — PX: ARTERY BIOPSY: SHX891

## 2021-01-31 SURGERY — BIOPSY TEMPORAL ARTERY
Anesthesia: Monitor Anesthesia Care | Site: Face | Laterality: Left

## 2021-01-31 MED ORDER — LIDOCAINE 2% (20 MG/ML) 5 ML SYRINGE
INTRAMUSCULAR | Status: DC | PRN
  Administered 2021-01-31: 50 mg via INTRAVENOUS

## 2021-01-31 MED ORDER — OXYCODONE HCL 5 MG/5ML PO SOLN: 5.0000 mg | Freq: Once | ORAL | Status: DC | PRN

## 2021-01-31 MED ORDER — LIDOCAINE HCL (PF) 2 % IJ SOLN
INTRAMUSCULAR | Status: AC
  Filled 2021-01-31: qty 5

## 2021-01-31 MED ORDER — ONDANSETRON HCL 4 MG/2ML IJ SOLN
INTRAMUSCULAR | Status: AC
  Filled 2021-01-31: qty 2

## 2021-01-31 MED ORDER — PROPOFOL 500 MG/50ML IV EMUL
INTRAVENOUS | Status: DC | PRN
  Administered 2021-01-31: 25 ug/kg/min via INTRAVENOUS

## 2021-01-31 MED ORDER — CHLORHEXIDINE GLUCONATE CLOTH 2 % EX PADS: 6.0000 | MEDICATED_PAD | Freq: Once | CUTANEOUS | Status: DC

## 2021-01-31 MED ORDER — BUPIVACAINE-EPINEPHRINE 0.25% -1:200000 IJ SOLN
INTRAMUSCULAR | Status: DC | PRN
  Administered 2021-01-31: 5 mL

## 2021-01-31 MED ORDER — FENTANYL CITRATE (PF) 100 MCG/2ML IJ SOLN
INTRAMUSCULAR | Status: AC
  Filled 2021-01-31: qty 2

## 2021-01-31 MED ORDER — TRAMADOL HCL 50 MG PO TABS: 50.0000 mg | ORAL_TABLET | Freq: Four times a day (QID) | ORAL | 0 refills | Status: AC | PRN

## 2021-01-31 MED ORDER — CEFAZOLIN IN SODIUM CHLORIDE 3-0.9 GM/100ML-% IV SOLN
3.0000 g | INTRAVENOUS | Status: AC
  Administered 2021-01-31: 3 g via INTRAVENOUS
  Filled 2021-01-31: qty 100

## 2021-01-31 MED ORDER — 0.9 % SODIUM CHLORIDE (POUR BTL) OPTIME
TOPICAL | Status: DC | PRN
  Administered 2021-01-31: 1000 mL

## 2021-01-31 MED ORDER — OXYCODONE HCL 5 MG PO TABS: 5.0000 mg | ORAL_TABLET | Freq: Once | ORAL | Status: DC | PRN

## 2021-01-31 MED ORDER — PROPOFOL 500 MG/50ML IV EMUL
INTRAVENOUS | Status: AC
  Filled 2021-01-31: qty 50

## 2021-01-31 MED ORDER — MIDAZOLAM HCL 2 MG/2ML IJ SOLN
INTRAMUSCULAR | Status: AC
  Filled 2021-01-31: qty 2

## 2021-01-31 MED ORDER — PROMETHAZINE HCL 25 MG/ML IJ SOLN: 6.2500 mg | INTRAMUSCULAR | Status: DC | PRN

## 2021-01-31 MED ORDER — ACETAMINOPHEN 325 MG PO TABS: 325.0000 mg | ORAL_TABLET | ORAL | Status: DC | PRN

## 2021-01-31 MED ORDER — MIDAZOLAM HCL 2 MG/2ML IJ SOLN
INTRAMUSCULAR | Status: DC | PRN
  Administered 2021-01-31: 2 mg via INTRAVENOUS

## 2021-01-31 MED ORDER — PROPOFOL 10 MG/ML IV BOLUS
INTRAVENOUS | Status: AC
  Filled 2021-01-31: qty 20

## 2021-01-31 MED ORDER — FENTANYL CITRATE PF 50 MCG/ML IJ SOSY: 25.0000 ug | PREFILLED_SYRINGE | INTRAMUSCULAR | Status: DC | PRN

## 2021-01-31 MED ORDER — FENTANYL CITRATE (PF) 100 MCG/2ML IJ SOLN
INTRAMUSCULAR | Status: DC | PRN
  Administered 2021-01-31 (×2): 50 ug via INTRAVENOUS

## 2021-01-31 MED ORDER — ACETAMINOPHEN 160 MG/5ML PO SOLN: 325.0000 mg | ORAL | Status: DC | PRN

## 2021-01-31 MED ORDER — ORAL CARE MOUTH RINSE: 15.0000 mL | Freq: Once | OROMUCOSAL | Status: AC

## 2021-01-31 MED ORDER — PROPOFOL 10 MG/ML IV BOLUS
INTRAVENOUS | Status: DC | PRN
  Administered 2021-01-31: 30 mg via INTRAVENOUS

## 2021-01-31 MED ORDER — ONDANSETRON HCL 4 MG/2ML IJ SOLN
INTRAMUSCULAR | Status: DC | PRN
  Administered 2021-01-31: 4 mg via INTRAVENOUS

## 2021-01-31 MED ORDER — ACETAMINOPHEN 500 MG PO TABS
1000.0000 mg | ORAL_TABLET | ORAL | Status: AC
  Administered 2021-01-31: 1000 mg via ORAL
  Filled 2021-01-31: qty 2

## 2021-01-31 MED ORDER — CHLORHEXIDINE GLUCONATE 0.12 % MT SOLN
15.0000 mL | Freq: Once | OROMUCOSAL | Status: AC
  Administered 2021-01-31: 15 mL via OROMUCOSAL

## 2021-01-31 MED ORDER — AMISULPRIDE (ANTIEMETIC) 5 MG/2ML IV SOLN: 10.0000 mg | Freq: Once | INTRAVENOUS | Status: DC | PRN

## 2021-01-31 MED ORDER — LACTATED RINGERS IV SOLN: INTRAVENOUS | Status: DC

## 2021-01-31 MED ORDER — ACETAMINOPHEN 10 MG/ML IV SOLN: 1000.0000 mg | Freq: Once | INTRAVENOUS | Status: DC | PRN

## 2021-01-31 MED ORDER — LIDOCAINE HCL (PF) 1 % IJ SOLN
INTRAMUSCULAR | Status: AC
  Filled 2021-01-31: qty 30

## 2021-01-31 SURGICAL SUPPLY — 40 items
BAG COUNTER SPONGE SURGICOUNT (BAG) IMPLANT
BLADE SURG 15 STRL LF DISP TIS (BLADE) ×1 IMPLANT
BLADE SURG 15 STRL SS (BLADE) ×2
CHLORAPREP W/TINT 26 (MISCELLANEOUS) ×2 IMPLANT
COVER SURGICAL LIGHT HANDLE (MISCELLANEOUS) ×2 IMPLANT
DERMABOND ADVANCED (GAUZE/BANDAGES/DRESSINGS) ×1
DERMABOND ADVANCED .7 DNX12 (GAUZE/BANDAGES/DRESSINGS) ×1 IMPLANT
DISSECTOR ROUND CHERRY 3/8 STR (MISCELLANEOUS) IMPLANT
DRAPE LAPAROTOMY T 98X78 PEDS (DRAPES) ×2 IMPLANT
DRSG TEGADERM 2-3/8X2-3/4 SM (GAUZE/BANDAGES/DRESSINGS) IMPLANT
ELECT NEEDLE TIP 2.8 STRL (NEEDLE) IMPLANT
ELECT REM PT RETURN 15FT ADLT (MISCELLANEOUS) ×2 IMPLANT
GAUZE 4X4 16PLY ~~LOC~~+RFID DBL (SPONGE) ×2 IMPLANT
GAUZE SPONGE 2X2 8PLY STRL LF (GAUZE/BANDAGES/DRESSINGS) IMPLANT
GAUZE SPONGE 4X4 12PLY STRL (GAUZE/BANDAGES/DRESSINGS) IMPLANT
GLOVE SURG POLYISO LF SZ7 (GLOVE) ×2 IMPLANT
GLOVE SURG UNDER POLY LF SZ7 (GLOVE) ×2 IMPLANT
GOWN STRL REUS W/TWL LRG LVL3 (GOWN DISPOSABLE) ×2 IMPLANT
GOWN STRL REUS W/TWL XL LVL3 (GOWN DISPOSABLE) ×2 IMPLANT
KIT BASIN OR (CUSTOM PROCEDURE TRAY) ×2 IMPLANT
KIT TURNOVER KIT A (KITS) IMPLANT
NEEDLE HYPO 22GX1.5 SAFETY (NEEDLE) IMPLANT
PACK BASIC VI WITH GOWN DISP (CUSTOM PROCEDURE TRAY) ×2 IMPLANT
PENCIL SMOKE EVACUATOR (MISCELLANEOUS) IMPLANT
SPONGE GAUZE 2X2 STER 10/PKG (GAUZE/BANDAGES/DRESSINGS)
SUCTION FRAZIER HANDLE 12FR (TUBING)
SUCTION TUBE FRAZIER 12FR DISP (TUBING) IMPLANT
SUT MNCRL AB 4-0 PS2 18 (SUTURE) ×2 IMPLANT
SUT SILK 2 0 (SUTURE)
SUT SILK 2-0 18XBRD TIE 12 (SUTURE) IMPLANT
SUT SILK 3 0 (SUTURE)
SUT SILK 3 0 SH 30 (SUTURE) ×2 IMPLANT
SUT SILK 3-0 18XBRD TIE 12 (SUTURE) IMPLANT
SUT SILK 4-0 12X30IN (SUTURE) ×2 IMPLANT
SUT VIC AB 3-0 SH 27 (SUTURE) ×4
SUT VIC AB 3-0 SH 27X BRD (SUTURE) ×1 IMPLANT
SUT VIC AB 3-0 SH 27XBRD (SUTURE) ×1 IMPLANT
SYR CONTROL 10ML LL (SYRINGE) IMPLANT
TOWEL OR 17X26 10 PK STRL BLUE (TOWEL DISPOSABLE) ×2 IMPLANT
TOWEL OR NON WOVEN STRL DISP B (DISPOSABLE) ×2 IMPLANT

## 2021-01-31 NOTE — Anesthesia Procedure Notes (Signed)
Procedure Name: MAC Date/Time: 01/31/2021 9:42 AM Performed by: Claudia Desanctis, CRNA Pre-anesthesia Checklist: Patient identified, Emergency Drugs available, Suction available and Patient being monitored Patient Re-evaluated:Patient Re-evaluated prior to induction Oxygen Delivery Method: Simple face mask

## 2021-01-31 NOTE — Op Note (Signed)
LEFT TEMPORAL ARTERY BIOPSY  Procedure Note  Mackenzie Everett 01/31/2021   Pre-op Diagnosis: LEFT SIDED HEADACHES, RULE OUT TEMPORAL ARTERITIS     Post-op Diagnosis: same  Procedure(s): LEFT TEMPORAL ARTERY BIOPSY  Surgeon(s): Abigail Miyamoto, MD  Anesthesia: Monitor Anesthesia Care  Staff:  Circulator: Selena Lesser, RN Scrub Person: Marga Melnick, RN  Estimated Blood Loss: Minimal               Specimens: sent to path  Indications: This is a 60 year old female with left-sided headaches and an elevated sed rate.  A temporal artery biopsy has been requested.  Procedure: The patient is brought to operating identifies correct patient.  She is placed upon the operating table and anesthesia was induced.  The left side of her head and face were then prepped and draped in usual sterile fashion.  I anesthetized the skin of the temporal area just superior and anterior to the left ear with lidocaine.  I then made a longitudinal incision with a scalpel.  I then took this down through the fascia with the cautery.  I then identified the temporal artery which was intimately involved with the temporal vein.  I decided to remove both together.  I clamped them proximally distally with right angle clamps and then transected it in 2 places with a scalpel.  The section of vessel was then sent to pathology for evaluation.  I then tied off the ends of the vessel with 3-0 silk ties.  Hemostasis peer to be achieved.  I then closed in the fascial layer with interrupted 3-0 Vicryl sutures and closed the skin with a running 4-0 Monocryl.  Dermabond was then applied.  The patient tolerated the procedure well.  All the counts were correct at the end of the procedure.  The patient was then taken in stable condition from the operating room to the recovery room.          Abigail Miyamoto   Date: 01/31/2021  Time: 10:20 AM

## 2021-01-31 NOTE — Anesthesia Postprocedure Evaluation (Signed)
Anesthesia Post Note  Patient: Mackenzie Everett  Procedure(s) Performed: LEFT TEMPORAL ARTERY BIOPSY (Left: Face)     Patient location during evaluation: PACU Anesthesia Type: MAC Level of consciousness: awake and alert Pain management: pain level controlled Vital Signs Assessment: post-procedure vital signs reviewed and stable Respiratory status: spontaneous breathing, nonlabored ventilation, respiratory function stable and patient connected to nasal cannula oxygen Cardiovascular status: stable and blood pressure returned to baseline Postop Assessment: no apparent nausea or vomiting Anesthetic complications: no   No notable events documented.  Last Vitals:  Vitals:   01/31/21 1040 01/31/21 1045  BP: 138/87 119/73  Pulse: 61 (!) 51  Resp: 10 14  Temp: 36.4 C   SpO2: 99% 98%    Last Pain:  Vitals:   01/31/21 1045  TempSrc:   PainSc: 0-No pain                 Effie Berkshire

## 2021-01-31 NOTE — Discharge Instructions (Signed)
Ok to shower starting tomorrow  Ice pack, tylenol, and ibuprofen also for pain  No vigorous activity for 5 days

## 2021-01-31 NOTE — Transfer of Care (Signed)
Immediate Anesthesia Transfer of Care Note  Patient: Mackenzie Everett  Procedure(s) Performed: LEFT TEMPORAL ARTERY BIOPSY (Left: Face)  Patient Location: PACU  Anesthesia Type:General  Level of Consciousness: awake, alert , oriented and patient cooperative  Airway & Oxygen Therapy: Patient Spontanous Breathing and Patient connected to face mask  Post-op Assessment: Report given to RN and Post -op Vital signs reviewed and stable  Post vital signs: Reviewed and stable  Last Vitals:  Vitals Value Taken Time  BP    Temp    Pulse 77 01/31/21 1022  Resp 18 01/31/21 1022  SpO2 97 % 01/31/21 1022  Vitals shown include unvalidated device data.  Last Pain:  Vitals:   01/31/21 0808  TempSrc: Oral  PainSc: 0-No pain      Patients Stated Pain Goal: 3 (01/31/21 4270)  Complications: No notable events documented.

## 2021-01-31 NOTE — Interval H&P Note (Signed)
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  The various methods of treatment have been discussed with the patient and family. After consideration of risks, benefits and other options for treatment, the patient has consented to  Procedure(s): LEFT TEMPORAL ARTERY BIOPSY (Left) as a surgical intervention.  The patient's history has been reviewed, patient examined, no change in status, stable for surgery.  I have reviewed the patient's chart and labs.  Questions were answered to the patient's satisfaction.     Abigail Miyamoto

## 2021-02-01 ENCOUNTER — Encounter (HOSPITAL_COMMUNITY): Payer: Self-pay | Admitting: Surgery

## 2021-02-01 LAB — SURGICAL PATHOLOGY

## 2021-03-06 ENCOUNTER — Telehealth: Payer: Self-pay

## 2021-03-06 NOTE — Telephone Encounter (Signed)
Caller name:Diora Chad   On DPR? :Yes  Call back [phone]  Provider they see: Neva Seat  Reason for call:Sedimentation rate Pt went to neurologist and [place] Biopsy was done and come back as not being inflamed come back negative. Pt was dismissed on the 28th from Dr. Magnus Ivan and needs to know where she needs to go from here regarding migraines. If new Sedimentation needs to be done etc

## 2021-03-07 NOTE — Telephone Encounter (Signed)
Good news on the temporal artery biopsy.  My understanding is that she was treated by Dr. Neale Burly for her headaches previously.  Has she reached out to his office?  Would recommend follow-up with Dr. Neale Burly for headaches as he is the headache specialist but let me know if there are questions.   As far as the elevated sed rate we certainly can recheck that at some point.  Follow-up with me in the next month and we can repeat that test and check in on her symptoms at that time.  If any new symptoms or questions let me know.

## 2021-04-23 ENCOUNTER — Ambulatory Visit: Payer: BC Managed Care – PPO | Admitting: Family Medicine

## 2021-05-02 ENCOUNTER — Encounter: Payer: Self-pay | Admitting: Family Medicine

## 2021-05-02 ENCOUNTER — Other Ambulatory Visit: Payer: Self-pay

## 2021-05-02 ENCOUNTER — Ambulatory Visit: Payer: BC Managed Care – PPO | Admitting: Family Medicine

## 2021-05-02 VITALS — BP 136/74 | HR 83 | Temp 98.1°F | Resp 16 | Ht 70.0 in | Wt 332.4 lb

## 2021-05-02 DIAGNOSIS — Z23 Encounter for immunization: Secondary | ICD-10-CM

## 2021-05-02 DIAGNOSIS — E785 Hyperlipidemia, unspecified: Secondary | ICD-10-CM

## 2021-05-02 DIAGNOSIS — I10 Essential (primary) hypertension: Secondary | ICD-10-CM

## 2021-05-02 DIAGNOSIS — R7303 Prediabetes: Secondary | ICD-10-CM | POA: Diagnosis not present

## 2021-05-02 DIAGNOSIS — Z8669 Personal history of other diseases of the nervous system and sense organs: Secondary | ICD-10-CM

## 2021-05-02 NOTE — Patient Instructions (Addendum)
Fasting labs in next 2 weeks at [facility].  Keep a record of your blood pressures outside of the office and let me know readings to decide on med changes.  Continue to watch diet, low intensity exercise most days per week. Start low and go slow on amount and intensity of exercise.  Depending on cholesterol we can discuss meds or continued diet/exercise approach.  Thanks for coming in today.    Managing Your Hypertension Hypertension, also called high blood pressure, is when the force of the blood pressing against the walls of the arteries is too strong. Arteries are blood vessels that carry blood from your heart throughout your body. Hypertension forces the heart to work harder to pump blood and may cause the arteries to become narrow or stiff. Understanding blood pressure readings Your personal target blood pressure may vary depending on your medical conditions, your age, and other factors. A blood pressure reading includes a higher number over a lower number. Ideally, your blood pressure should be below 120/80. You should know that: The first, or top, number is called the systolic pressure. It is a measure of the pressure in your arteries as your heart beats. The second, or bottom number, is called the diastolic pressure. It is a measure of the pressure in your arteries as the heart relaxes. Blood pressure is classified into four stages. Based on your blood pressure reading, your health care provider may use the following stages to determine what type of treatment you need, if any. Systolic pressure and diastolic pressure are measured in a unit called mmHg. Normal Systolic pressure: below 120. Diastolic pressure: below 80. Elevated Systolic pressure: 120-129. Diastolic pressure: below 80. Hypertension stage 1 Systolic pressure: 130-139. Diastolic pressure: 80-89. Hypertension stage 2 Systolic pressure: 140 or above. Diastolic pressure: 90 or above. How can this condition affect  me? Managing your hypertension is an important responsibility. Over time, hypertension can damage the arteries and decrease blood flow to important parts of the body, including the brain, heart, and kidneys. Having untreated or uncontrolled hypertension can lead to: A heart attack. A stroke. A weakened blood vessel (aneurysm). Heart failure. Kidney damage. Eye damage. Metabolic syndrome. Memory and concentration problems. Vascular dementia. What actions can I take to manage this condition? Hypertension can be managed by making lifestyle changes and possibly by taking medicines. Your health care provider will help you make a plan to bring your blood pressure within a normal range. Nutrition  Eat a diet that is high in fiber and potassium, and low in salt (sodium), added sugar, and fat. An example eating plan is called the Dietary Approaches to Stop Hypertension (DASH) diet. To eat this way: Eat plenty of fresh fruits and vegetables. Try to fill one-half of your plate at each meal with fruits and vegetables. Eat whole grains, such as whole-wheat pasta, brown rice, or whole-grain bread. Fill about one-fourth of your plate with whole grains. Eat low-fat dairy products. Avoid fatty cuts of meat, processed or cured meats, and poultry with skin. Fill about one-fourth of your plate with lean proteins such as fish, chicken without skin, beans, eggs, and tofu. Avoid pre-made and processed foods. These tend to be higher in sodium, added sugar, and fat. Reduce your daily sodium intake. Most people with hypertension should eat less than 1,500 mg of sodium a day. Lifestyle  Work with your health care provider to maintain a healthy body weight or to lose weight. Ask what an ideal weight is for you. Get at  least 30 minutes of exercise that causes your heart to beat faster (aerobic exercise) most days of the week. Activities may include walking, swimming, or biking. Include exercise to strengthen your  muscles (resistance exercise), such as weight lifting, as part of your weekly exercise routine. Try to do these types of exercises for 30 minutes at least 3 days a week. Do not use any products that contain nicotine or tobacco, such as cigarettes, e-cigarettes, and chewing tobacco. If you need help quitting, ask your health care provider. Control any long-term (chronic) conditions you have, such as high cholesterol or diabetes. Identify your sources of stress and find ways to manage stress. This may include meditation, deep breathing, or making time for fun activities. Alcohol use Do not drink alcohol if: Your health care provider tells you not to drink. You are pregnant, may be pregnant, or are planning to become pregnant. If you drink alcohol: Limit how much you use to: 0-1 drink a day for women. 0-2 drinks a day for men. Be aware of how much alcohol is in your drink. In the [place] one drink equals one 12 oz bottle of beer (355 mL), one 5 oz glass of wine (148 mL), or one 1 oz glass of hard liquor (44 mL). Medicines Your health care provider may prescribe medicine if lifestyle changes are not enough to get your blood pressure under control and if: Your systolic blood pressure is 130 or higher. Your diastolic blood pressure is 80 or higher. Take medicines only as told by your health care provider. Follow the directions carefully. Blood pressure medicines must be taken as told by your health care provider. The medicine does not work as well when you skip doses. Skipping doses also puts you at risk for problems. Monitoring Before you monitor your blood pressure: Do not smoke, drink caffeinated beverages, or exercise within 30 minutes before taking a measurement. Use the bathroom and empty your bladder (urinate). Sit quietly for at least 5 minutes before taking measurements. Monitor your blood pressure at home as told by your health care provider. To do this: Sit with your back straight and  supported. Place your feet flat on the floor. Do not cross your legs. Support your arm on a flat surface, such as a table. Make sure your upper arm is at heart level. Each time you measure, take two or three readings one minute apart and record the results. You may also need to have your blood pressure checked regularly by your health care provider. General information Talk with your health care provider about your diet, exercise habits, and other lifestyle factors that may be contributing to hypertension. Review all the medicines you take with your health care provider because there may be side effects or interactions. Keep all visits as told by your health care provider. Your health care provider can help you create and adjust your plan for managing your high blood pressure. Where to find more information National Heart, Lung, and Blood Institute: [employer]is American Heart Association: www.heart.org Contact a health care provider if: You think you are having a reaction to medicines you have taken. You have repeated (recurrent) headaches. You feel dizzy. You have swelling in your ankles. You have trouble with your vision. Get help right away if: You develop a severe headache or confusion. You have unusual weakness or numbness, or you feel faint. You have severe pain in your chest or abdomen. You vomit repeatedly. You have trouble breathing. These symptoms may represent a serious problem  that is an emergency. Do not wait to see if the symptoms will go away. Get medical help right away. Call your local emergency services (911 in the [place] Do not drive yourself to the hospital. Summary Hypertension is when the force of blood pumping through your arteries is too strong. If this condition is not controlled, it may put you at risk for serious complications. Your personal target blood pressure may vary depending on your medical conditions, your age, and other factors. For most people, a  normal blood pressure is less than 120/80. Hypertension is managed by lifestyle changes, medicines, or both. Lifestyle changes to help manage hypertension include losing weight, eating a healthy, low-sodium diet, exercising more, stopping smoking, and limiting alcohol. This information is not intended to replace advice given to you by your health care provider. Make sure you discuss any questions you have with your health care provider. Document Revised: 04/05/2019 Document Reviewed: 02/16/2019 Elsevier Patient Education  2022 [employer].      If you have lab work done today you will be contacted with your lab results within the next 2 weeks.  If you have not heard from [place] then please contact us. The fastest way to get your results is to register for My Chart.   IF you received an x-ray today, you will receive an invoice from [facility]. Please contact [facility] at [phone] with questions or concerns regarding your invoice.   IF you received labwork today, you will receive an invoice from [facility]. Please contact [facility] at [phone] with questions or concerns regarding your invoice.   Our billing staff will not be able to assist you with questions regarding bills from these companies.  You will be contacted with the lab results as soon as they are available. The fastest way to get your results is to activate your My Chart account. Instructions are located on the last page of this paperwork. If you have not heard from [place] regarding the results in 2 weeks, please contact this office.

## 2021-05-02 NOTE — Progress Notes (Signed)
Subjective:  Patient ID: Mackenzie Everett, female    DOB: June 26, 1960  Age: 61 y.o. MRN: [id number]  CC:  Chief Complaint  Patient presents with   Follow-up    Patient states she is here for a one month follow up and flu shot.    HPI BELLAMARIE PFLUG presents for   Recurrent headaches Last discussed in August.  History of migraine.  Did have elevated sed rate.  At that time had elevated sed rate, did not need any change or worsening headache, visual symptoms or jaw claudication.  Initially deferred high-dose prednisone until evaluation with temporal artery biopsy.  Consult with surgery October 10th, left temporal artery biopsy on 01/31/2021, which was normal, no sign of arteritis. Previous patient of [facility].  Plan for follow-up for continued treatment of migraines. Also receiving injections.  Planned appt end of December, had to reschedule d/t contact with Covid. Saw Dr. Neale Burly in November - continued on Zonisamide now up to 100mg  qd. Ran out last week. Will call to get refill and instructions on use form HA specialist.no relief with baclofen.  Occasional HA. 2 last week - stress - mom sick past 2 months.   Hypertension: Toprol-XL 25 mg daily, losartan HCTZ 50/12.5 mg daily. No missed doses of meds. No new side effects  Home readings:none BP Readings from Last 3 Encounters:  05/02/21 136/74  01/31/21 119/73  01/23/21 137/71   Lab Results  Component Value Date   CREATININE 0.92 01/23/2021   Hyperlipidemia: No current statin.  Has tried to adjust diet/exercise intermittently.  Not fasting today.  Wt Readings from Last 3 Encounters:  05/02/21 (!) 332 lb 6.4 oz (150.8 kg)  01/23/21 (!) 324 lb (147 kg)  11/15/20 (!) 325 lb 9.6 oz (147.7 kg)   The 10-year ASCVD risk score (Arnett DK, et al., 2019) is: 13.3%   Values used to calculate the score:     Age: 68 years     Sex: Female     Is Non-Hispanic African American: Yes     Diabetic: No     Tobacco  smoker: No     Systolic Blood Pressure: 149 mmHg     Is BP treated: Yes     HDL Cholesterol: 50.2 mg/dL     Total Cholesterol: 228 mg/dL  Lab Results  Component Value Date   CHOL 228 (H) 11/09/2020   HDL 50.20 11/09/2020   LDLCALC 153 (H) 11/09/2020   TRIG 125.0 11/09/2020   CHOLHDL 5 11/09/2020   Lab Results  Component Value Date   ALT 9 11/09/2020   AST 10 11/09/2020   ALKPHOS 71 11/09/2020   BILITOT 0.4 11/09/2020   Prediabetes: Weight as above and trying to watch diet.  Lab Results  Component Value Date   HGBA1C 6.3 11/09/2020   Wt Readings from Last 3 Encounters:  05/02/21 (!) 332 lb 6.4 oz (150.8 kg)  01/23/21 (!) 324 lb (147 kg)  11/15/20 (!) 325 lb 9.6 oz (147.7 kg)       History Patient Active Problem List   Diagnosis Date Noted   Hypertension 06/24/2020   Pre-op evaluation 06/22/2020   Hepatitis C 01/19/2016   Obesity, Class III, BMI 40-49.9 (morbid obesity) (HCC) 09/04/2011   Hyperlipidemia 09/04/2011   Migraine variant 09/04/2011   Asthma with allergic rhinitis 09/04/2011   Degenerative disc disease, lumbar 09/04/2011   Past Medical History:  Diagnosis Date   Allergy    Arthritis  hands and knees   Asthma    exercise induced   Hyperlipidemia 09/04/2011   Hypertension    Past Surgical History:  Procedure Laterality Date   APPENDECTOMY  1998   ARTERY BIOPSY Left 01/31/2021   Procedure: LEFT TEMPORAL ARTERY BIOPSY;  Surgeon: Abigail MiyamotoBlackman, Douglas, MD;  Location: [facility];  Service: General;  Laterality: Left;   BREAST CYST EXCISION Right 1980   KNEE ARTHROSCOPY Right 06/2020   KNEE SURGERY Left 1994   TUBAL LIGATION  1992   Allergies  Allergen Reactions   Caffeine Other (See Comments)    migraines   Penicillins Hives    Tolerated 3 grams of ancef 01/31/2021   Prior to Admission medications   Medication Sig Start Date End Date Taking? Authorizing Provider  baclofen (LIORESAL) 10 MG tablet Take 10 mg by mouth 2 (two) times daily as needed  for migraine. 12/05/20  Yes [provider]  fluticasone (FLONASE) 50 MCG/ACT nasal spray Place 2 sprays into both nostrils daily as needed for allergies or rhinitis.   Yes [provider]  losartan-hydrochlorothiazide (HYZAAR) 50-12.5 MG tablet Take 1 tablet by mouth daily. 11/09/20  Yes Shade FloodGreene, Lounell Schumacher R, MD  metoprolol succinate (TOPROL-XL) 25 MG 24 hr tablet Take 1 tablet (25 mg total) by mouth daily. Patient taking differently: Take 25 mg by mouth at bedtime. 11/15/20  Yes Shade FloodGreene, Ronda Kazmi R, MD  traMADol (ULTRAM) 50 MG tablet Take 1 tablet (50 mg total) by mouth every 6 (six) hours as needed for moderate pain or severe pain. 01/31/21  Yes Abigail MiyamotoBlackman, Douglas, MD  zonisamide (ZONEGRAN) 25 MG capsule Take 100 mg by mouth at bedtime. 01/01/21  Yes [provider]  SUMAtriptan (IMITREX) 25 MG tablet Take 1 tablet (25 mg total) by mouth once for 1 dose. May repeat once in 2 hours if headache persists or recurs. Patient not taking: Reported on 01/19/2021 11/09/20 11/09/20  Shade FloodGreene, Sammi Stolarz R, MD   Social History   Socioeconomic History   Marital status: Single    Spouse name: Not on file   Number of children: 4   Years of education: Not on file   Highest education level: Not on file  Occupational History   Not on file  Tobacco Use   Smoking status: Never   Smokeless tobacco: Never  Vaping Use   Vaping Use: Never used  Substance and Sexual Activity   Alcohol use: Yes    Comment: social   Drug use: No   Sexual activity: Yes    Birth control/protection: Post-menopausal  Other Topics Concern   Not on file  Social History Narrative   Lives at home alone   Social Determinants of Health   Financial Resource Strain: Not on file  Food Insecurity: Not on file  Transportation Needs: Not on file  Physical Activity: Not on file  Stress: Not on file  Social Connections: Not on file  Intimate Partner Violence: Not on file    Review of Systems  Constitutional:  Negative  for fatigue and unexpected weight change.  Respiratory:  Negative for chest tightness and shortness of breath.   Cardiovascular:  Negative for chest pain, palpitations and leg swelling.  Gastrointestinal:  Negative for abdominal pain and blood in stool.  Neurological:  Negative for dizziness, syncope, light-headedness and headaches.    Objective:   Vitals:   05/02/21 1300 05/02/21 1357  BP: (!) 149/67 136/74  Pulse: 83   Resp: 16   Temp: 98.1 F (36.7 C)   TempSrc:  Temporal   SpO2: 100%   Weight: (!) 332 lb 6.4 oz (150.8 kg)   Height: 5\' 10"  (1.778 m)      Physical Exam Vitals reviewed.  Constitutional:      Appearance: Normal appearance. She is well-developed.  HENT:     Head: Normocephalic and atraumatic.  Eyes:     Conjunctiva/sclera: Conjunctivae normal.     Pupils: Pupils are equal, round, and reactive to light.  Neck:     Vascular: No carotid bruit.  Cardiovascular:     Rate and Rhythm: Normal rate and regular rhythm.     Heart sounds: Normal heart sounds.  Pulmonary:     Effort: Pulmonary effort is normal.     Breath sounds: Normal breath sounds.  Abdominal:     Palpations: Abdomen is soft. There is no pulsatile mass.     Tenderness: There is no abdominal tenderness.  Musculoskeletal:     Right lower leg: No edema.     Left lower leg: No edema.  Skin:    General: Skin is warm and dry.     Comments: Well healed scar left temple.   Neurological:     Mental Status: She is alert and oriented to person, place, and time.  Psychiatric:        Mood and Affect: Mood normal.        Behavior: Behavior normal.     Assessment & Plan:  DERENDA GIDDINGS is a 61 y.o. female . Essential hypertension - Plan: Comprehensive metabolic panel  -Check labs, stable on current regimen with repeat testing.  Home monitoring with RTC precautions discussed...  Plan on lab only visit for fasting labs.  Continue Hyzaar, Toprol same dose.  Flu vaccine need - Plan: Flu Vaccine QUAD 6+  mos PF IM (Fluarix Quad PF)  History of migraine  -Follow-up with headache specialist regarding restart of zonisamide and dosing as she had been tapered up to 100 mg.  Continue ongoing care with headache specialist.  Reassured by temporal artery biopsy.  Prediabetes - Plan: Hemoglobin A1c  -Diet, exercise discussed, and goals discussed.  Hyperlipidemia, unspecified hyperlipidemia type - Plan: Comprehensive metabolic panel, Lipid panel  -Check labs, depending on ASCVD risk score consider low-dose statin versus close monitoring with diet/exercise changes.  No orders of the defined types were placed in this encounter.  Patient Instructions  Fasting labs in next 2 weeks at [facility] lab.  Keep a record of your blood pressures outside of the office and let me know readings to decide on med changes.  Continue to watch diet, low intensity exercise most days per week. Start low and go slow on amount and intensity of exercise.  Depending on cholesterol we can discuss meds or continued diet/exercise approach.  Thanks for coming in today.    Managing Your Hypertension Hypertension, also called high blood pressure, is when the force of the blood pressing against the walls of the arteries is too strong. Arteries are blood vessels that carry blood from your heart throughout your body. Hypertension forces the heart to work harder to pump blood and may cause the arteries to become narrow or stiff. Understanding blood pressure readings Your personal target blood pressure may vary depending on your medical conditions, your age, and other factors. A blood pressure reading includes a higher number over a lower number. Ideally, your blood pressure should be below 120/80. You should know that: The first, or top, number is called the systolic pressure. It is a measure of  the pressure in your arteries as your heart beats. The second, or bottom number, is called the diastolic pressure. It is a measure of the pressure  in your arteries as the heart relaxes. Blood pressure is classified into four stages. Based on your blood pressure reading, your health care provider may use the following stages to determine what type of treatment you need, if any. Systolic pressure and diastolic pressure are measured in a unit called mmHg. Normal Systolic pressure: below 120. Diastolic pressure: below 80. Elevated Systolic pressure: 120-129. Diastolic pressure: below 80. Hypertension stage 1 Systolic pressure: 130-139. Diastolic pressure: 80-89. Hypertension stage 2 Systolic pressure: 140 or above. Diastolic pressure: 90 or above. How can this condition affect me? Managing your hypertension is an important responsibility. Over time, hypertension can damage the arteries and decrease blood flow to important parts of the body, including the brain, heart, and kidneys. Having untreated or uncontrolled hypertension can lead to: A heart attack. A stroke. A weakened blood vessel (aneurysm). Heart failure. Kidney damage. Eye damage. Metabolic syndrome. Memory and concentration problems. Vascular dementia. What actions can I take to manage this condition? Hypertension can be managed by making lifestyle changes and possibly by taking medicines. Your health care provider will help you make a plan to bring your blood pressure within a normal range. Nutrition  Eat a diet that is high in fiber and potassium, and low in salt (sodium), added sugar, and fat. An example eating plan is called the Dietary Approaches to Stop Hypertension (DASH) diet. To eat this way: Eat plenty of fresh fruits and vegetables. Try to fill one-half of your plate at each meal with fruits and vegetables. Eat whole grains, such as whole-wheat pasta, brown rice, or whole-grain bread. Fill about one-fourth of your plate with whole grains. Eat low-fat dairy products. Avoid fatty cuts of meat, processed or cured meats, and poultry with skin. Fill about  one-fourth of your plate with lean proteins such as fish, chicken without skin, beans, eggs, and tofu. Avoid pre-made and processed foods. These tend to be higher in sodium, added sugar, and fat. Reduce your daily sodium intake. Most people with hypertension should eat less than 1,500 mg of sodium a day. Lifestyle  Work with your health care provider to maintain a healthy body weight or to lose weight. Ask what an ideal weight is for you. Get at least 30 minutes of exercise that causes your heart to beat faster (aerobic exercise) most days of the week. Activities may include walking, swimming, or biking. Include exercise to strengthen your muscles (resistance exercise), such as weight lifting, as part of your weekly exercise routine. Try to do these types of exercises for 30 minutes at least 3 days a week. Do not use any products that contain nicotine or tobacco, such as cigarettes, e-cigarettes, and chewing tobacco. If you need help quitting, ask your health care provider. Control any long-term (chronic) conditions you have, such as high cholesterol or diabetes. Identify your sources of stress and find ways to manage stress. This may include meditation, deep breathing, or making time for fun activities. Alcohol use Do not drink alcohol if: Your health care provider tells you not to drink. You are pregnant, may be pregnant, or are planning to become pregnant. If you drink alcohol: Limit how much you use to: 0-1 drink a day for women. 0-2 drinks a day for men. Be aware of how much alcohol is in your drink. In the [place] one drink equals one 12 oz  bottle of beer (355 mL), one 5 oz glass of wine (148 mL), or one 1 oz glass of hard liquor (44 mL). Medicines Your health care provider may prescribe medicine if lifestyle changes are not enough to get your blood pressure under control and if: Your systolic blood pressure is 130 or higher. Your diastolic blood pressure is 80 or higher. Take medicines  only as told by your health care provider. Follow the directions carefully. Blood pressure medicines must be taken as told by your health care provider. The medicine does not work as well when you skip doses. Skipping doses also puts you at risk for problems. Monitoring Before you monitor your blood pressure: Do not smoke, drink caffeinated beverages, or exercise within 30 minutes before taking a measurement. Use the bathroom and empty your bladder (urinate). Sit quietly for at least 5 minutes before taking measurements. Monitor your blood pressure at home as told by your health care provider. To do this: Sit with your back straight and supported. Place your feet flat on the floor. Do not cross your legs. Support your arm on a flat surface, such as a table. Make sure your upper arm is at heart level. Each time you measure, take two or three readings one minute apart and record the results. You may also need to have your blood pressure checked regularly by your health care provider. General information Talk with your health care provider about your diet, exercise habits, and other lifestyle factors that may be contributing to hypertension. Review all the medicines you take with your health care provider because there may be side effects or interactions. Keep all visits as told by your health care provider. Your health care provider can help you create and adjust your plan for managing your high blood pressure. Where to find more information National Heart, Lung, and Blood Institute: PopSteam.iswww.nhlbi.nih.gov American Heart Association: www.heart.org Contact a health care provider if: You think you are having a reaction to medicines you have taken. You have repeated (recurrent) headaches. You feel dizzy. You have swelling in your ankles. You have trouble with your vision. Get help right away if: You develop a severe headache or confusion. You have unusual weakness or numbness, or you feel  faint. You have severe pain in your chest or abdomen. You vomit repeatedly. You have trouble breathing. These symptoms may represent a serious problem that is an emergency. Do not wait to see if the symptoms will go away. Get medical help right away. Call your local emergency services (911 in the [place] Do not drive yourself to the hospital. Summary Hypertension is when the force of blood pumping through your arteries is too strong. If this condition is not controlled, it may put you at risk for serious complications. Your personal target blood pressure may vary depending on your medical conditions, your age, and other factors. For most people, a normal blood pressure is less than 120/80. Hypertension is managed by lifestyle changes, medicines, or both. Lifestyle changes to help manage hypertension include losing weight, eating a healthy, low-sodium diet, exercising more, stopping smoking, and limiting alcohol. This information is not intended to replace advice given to you by your health care provider. Make sure you discuss any questions you have with your health care provider. Document Revised: 04/05/2019 Document Reviewed: 02/16/2019 Elsevier Patient Education  2022 [employer]      If you have lab work done today you will be contacted with your lab results within the next 2 weeks.  If  you have not heard from [place] then please contact us. The fastest way to get your results is to register for My Chart.   IF you received an x-ray today, you will receive an invoice from [facility]. Please contact [facility] at [phone] with questions or concerns regarding your invoice.   IF you received labwork today, you will receive an invoice from [facility]. Please contact [facility] at [phone] with questions or concerns regarding your invoice.   Our billing staff will not be able to assist you with questions regarding bills from these companies.  You will be contacted with  the lab results as soon as they are available. The fastest way to get your results is to activate your My Chart account. Instructions are located on the last page of this paperwork. If you have not heard from [place] regarding the results in 2 weeks, please contact this office.        Signed,   Meredith Staggers, MD [facility], [facility] 05/02/21 1:35 PM

## 2021-05-03 MED ORDER — METOPROLOL SUCCINATE ER 25 MG PO TB24: 25.0000 mg | ORAL_TABLET | Freq: Every day | ORAL | 2 refills | Status: DC

## 2021-05-03 MED ORDER — LOSARTAN POTASSIUM-HCTZ 50-12.5 MG PO TABS: 1.0000 | ORAL_TABLET | Freq: Every day | ORAL | 2 refills | Status: DC

## 2021-05-21 ENCOUNTER — Other Ambulatory Visit (INDEPENDENT_AMBULATORY_CARE_PROVIDER_SITE_OTHER): Payer: BC Managed Care – PPO

## 2021-05-21 DIAGNOSIS — R7303 Prediabetes: Secondary | ICD-10-CM

## 2021-05-21 DIAGNOSIS — I10 Essential (primary) hypertension: Secondary | ICD-10-CM | POA: Diagnosis not present

## 2021-05-21 DIAGNOSIS — E785 Hyperlipidemia, unspecified: Secondary | ICD-10-CM

## 2021-05-21 LAB — COMPREHENSIVE METABOLIC PANEL
ALT: 11 U/L (ref 0–35)
AST: 11 U/L (ref 0–37)
Albumin: 4.4 g/dL (ref 3.5–5.2)
Alkaline Phosphatase: 77 U/L (ref 39–117)
BUN: 19 mg/dL (ref 6–23)
CO2: 29 mEq/L (ref 19–32)
Calcium: 9.7 mg/dL (ref 8.4–10.5)
Chloride: 103 mEq/L (ref 96–112)
Creatinine, Ser: 1 mg/dL (ref 0.40–1.20)
GFR: 60.23 mL/min (ref >60.00)
Glucose, Bld: 94 mg/dL (ref 70–99)
Potassium: 4.2 mEq/L (ref 3.5–5.1)
Sodium: 137 mEq/L (ref 135–145)
Total Bilirubin: 0.4 mg/dL (ref 0.2–1.2)
Total Protein: 7.9 g/dL (ref 6.0–8.3)

## 2021-05-21 LAB — LIPID PANEL
Cholesterol: 230 mg/dL — ABNORMAL HIGH (ref 0–200)
HDL: 54.7 mg/dL (ref >39.00)
LDL Cholesterol: 155 mg/dL — ABNORMAL HIGH (ref 0–99)
NonHDL: 175.66
Total CHOL/HDL Ratio: 4
Triglycerides: 101 mg/dL (ref 0.0–149.0)
VLDL: 20.2 mg/dL (ref 0.0–40.0)

## 2021-05-21 LAB — HEMOGLOBIN A1C: Hgb A1c MFr Bld: 6.3 % (ref 4.6–6.5)

## 2021-05-27 ENCOUNTER — Encounter: Payer: Self-pay | Admitting: Family Medicine

## 2021-05-27 DIAGNOSIS — R700 Elevated erythrocyte sedimentation rate: Secondary | ICD-10-CM

## 2021-05-28 NOTE — Telephone Encounter (Signed)
Were we not doing another Sed Rate test?  Please advise

## 2021-06-13 ENCOUNTER — Other Ambulatory Visit (INDEPENDENT_AMBULATORY_CARE_PROVIDER_SITE_OTHER): Payer: BC Managed Care – PPO

## 2021-06-13 DIAGNOSIS — R700 Elevated erythrocyte sedimentation rate: Secondary | ICD-10-CM

## 2021-06-13 LAB — SEDIMENTATION RATE: Sed Rate: 87 mm/hr — ABNORMAL HIGH (ref 0–30)

## 2021-06-19 ENCOUNTER — Encounter: Payer: Self-pay | Admitting: Family Medicine

## 2021-06-19 DIAGNOSIS — R700 Elevated erythrocyte sedimentation rate: Secondary | ICD-10-CM

## 2021-07-03 ENCOUNTER — Telehealth: Payer: Self-pay

## 2021-07-03 DIAGNOSIS — R700 Elevated erythrocyte sedimentation rate: Secondary | ICD-10-CM

## 2021-07-03 NOTE — Telephone Encounter (Signed)
Patient called in stating they discussed going to see a Rheumatologist but received a referral to an Endocrinologist. Asked for clarification as she wont be able to get in with Endocrinologist until September.  ?

## 2021-07-04 NOTE — Telephone Encounter (Signed)
Should be rheumatology, not endocrinology. I did enter wrong referral and will correct now. Called pt, left VM - will send mychart message.  ?

## 2021-07-18 ENCOUNTER — Ambulatory Visit (INDEPENDENT_AMBULATORY_CARE_PROVIDER_SITE_OTHER): Payer: BC Managed Care – PPO | Admitting: Primary Care

## 2021-07-18 ENCOUNTER — Encounter: Payer: Self-pay | Admitting: Primary Care

## 2021-07-18 VITALS — BP 116/68 | HR 88 | Temp 98.9°F | Ht 70.0 in | Wt 333.6 lb

## 2021-07-18 DIAGNOSIS — I480 Paroxysmal atrial fibrillation: Secondary | ICD-10-CM | POA: Diagnosis not present

## 2021-07-18 DIAGNOSIS — G4733 Obstructive sleep apnea (adult) (pediatric): Secondary | ICD-10-CM | POA: Diagnosis not present

## 2021-07-18 DIAGNOSIS — Z8669 Personal history of other diseases of the nervous system and sense organs: Secondary | ICD-10-CM

## 2021-07-18 NOTE — Assessment & Plan Note (Signed)
-   Regular rate and rhythm on exam today. She is on Troprol-XL 25mg . Not on anticoagulation.  ?

## 2021-07-18 NOTE — Assessment & Plan Note (Signed)
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 She was never started on CPAP. She has hx paroxsymal afib. Symptoms of insomnia and daytime sleepiness. [place] 8. BMI 47. Recommending repeating HST to evaluate sleep apnea. We reviewed risk of untreated sleep apnea and treatment options. If patient has moderate or severe OAS would recommend starting CPAP, she is hesitant d/t claustrophobia but is open to trying nasal mask. Encourage side sleeping position and weight loss. Advised against driving if experiencing excessive daytime sleepiness.  ?

## 2021-07-18 NOTE — Progress Notes (Signed)
? ?@Patient  ID: Mackenzie Everett, female    DOB: 19-Dec-1960, 61 y.o.   MRN: [id number] ? ?No chief complaint on file. ? ? ?Referring provider: ?Wendie Agreste, MD ? ?HPI: ?61 year old female, never smoked. PMH significant for HTN, paroxysmal afib, allergic rhinitis, asthma, hep C, obesity, hyperlipidemia.  ? ?07/18/2021 ?Patient presents today for sleep consult. Patient has a hx of sleep apnea. She had polysomnogram in 2019 that showed mild obstructive sleep apnea, severe in REM sleep and during spine sleep. Total AHI 9.4/hr and REM AHI 56.4 and supine AHI 30.4/hr and O2 nadir 83%. It was recommended that she had CPAP titration study but this was never completed. She was never started on CPAP therapy. She is very claustrophobic and does not feel she could wear a CPAP at night. She states that she may be able to tolerate a nasal mask. She is a side sleeper.  Denies narcolepsy, cataplexy, sleep walking.  ? ?Sleep questionnaire ?Symptoms- Insomnia, daytime sleepiness  ?Prior sleep study- 2019 mild OSA, AHI 9.4/hr  ?Bedtime- 11-11:30pm ?Time to fall asleep- varies; most of the times 30 mins, other times hours  ?Nocturnal awakenings- 1-3 ?Out of bed/start of day- 5:30am  ?Weight changes- No  ?Do you operate heavy machinery- No ?Do you currently wear CPAP- No ?Do you current wear oxygen- No ?Epworth- 8 ? ?Allergies  ?Allergen Reactions  ? Caffeine Other (See Comments)  ?  migraines  ? Penicillins Hives  ?  Tolerated 3 grams of ancef 01/31/2021  ? ? ?Immunization History  ?Administered Date(s) Administered  ? Influenza Split 02/26/2012  ? Influenza, Seasonal, Injecte, Preservative Fre 02/26/2012, 12/20/2014  ? Influenza,inj,Quad PF,6+ Mos 12/20/2014, 04/29/2016, 04/15/2017, 01/10/2018, 11/19/2018, 01/25/2020, 05/02/2021  ? PFIZER(Purple Top)SARS-COV-2 Vaccination 05/28/2019, 06/18/2019, 03/13/2020, 08/29/2020  ? Tdap 12/20/2014  ? Zoster Recombinat (Shingrix) 11/27/2000, 11/19/2018  ? ? ?Past Medical History:  ?Diagnosis Date   ? Allergy   ? Arthritis   ? hands and knees  ? Asthma   ? exercise induced  ? Hyperlipidemia 09/04/2011  ? Hypertension   ? ? ?Tobacco History: ?Social History  ? ?Tobacco Use  ?Smoking Status Never  ?Smokeless Tobacco Never  ? ?Counseling given: Not Answered ? ? ?Outpatient Medications Prior to Visit  ?Medication Sig Dispense Refill  ? baclofen (LIORESAL) 10 MG tablet Take 10 mg by mouth 2 (two) times daily as needed for migraine.    ? fluticasone (FLONASE) 50 MCG/ACT nasal spray Place 2 sprays into both nostrils daily as needed for allergies or rhinitis.    ? losartan-hydrochlorothiazide (HYZAAR) 50-12.5 MG tablet Take 1 tablet by mouth daily. 90 tablet 2  ? metoprolol succinate (TOPROL-XL) 25 MG 24 hr tablet Take 1 tablet (25 mg total) by mouth at bedtime. 90 tablet 2  ? traMADol (ULTRAM) 50 MG tablet Take 1 tablet (50 mg total) by mouth every 6 (six) hours as needed for moderate pain or severe pain. 20 tablet 0  ? zonisamide (ZONEGRAN) 25 MG capsule Take 100 mg by mouth at bedtime.    ? ?No facility-administered medications prior to visit.  ? ? ?Review of Systems ? ?Review of Systems  ?Constitutional: Negative.   ?Respiratory: Negative.    ?Cardiovascular: Negative.   ?Psychiatric/Behavioral:  Positive for sleep disturbance.   ? ? ?Physical Exam ? ?BP 116/68 (BP Location: Right Wrist, Patient Position: Sitting, Cuff Size: Normal)   Pulse 88   Temp 98.9 ?F (37.2 ?C) (Oral)   Ht 5\' 10"  (1.778 m)   Wt Marland Kitchen)  333 lb 9.6 oz (151.3 kg)   SpO2 100%   BMI 47.87 kg/m?  ?Physical Exam ?Constitutional:   ?   General: She is not in acute distress. ?   Appearance: Normal appearance. She is obese. She is not ill-appearing.  ?HENT:  ?   Head: Normocephalic and atraumatic.  ?   Mouth/Throat:  ?   Mouth: Mucous membranes are moist.  ?   Pharynx: Oropharynx is clear.  ?Cardiovascular:  ?   Rate and Rhythm: Normal rate and regular rhythm.  ?Pulmonary:  ?   Effort: Pulmonary effort is normal.  ?   Breath sounds: Normal breath  sounds.  ?Musculoskeletal:     ?   General: Normal range of motion.  ?Skin: ?   General: Skin is warm and dry.  ?Neurological:  ?   General: No focal deficit present.  ?   Mental Status: She is alert and oriented to person, place, and time. Mental status is at baseline.  ?Psychiatric:     ?   Mood and Affect: Mood normal.     ?   Behavior: Behavior normal.     ?   Thought Content: Thought content normal.     ?   Judgment: Judgment normal.  ?  ? ?Lab Results: ? ?CBC ?   ?Component Value Date/Time  ? WBC 9.1 01/23/2021 1330  ? RBC 4.54 01/23/2021 1330  ? HGB 11.5 (L) 01/23/2021 1330  ? HGB 11.8 07/07/2017 1759  ? HCT 37.5 01/23/2021 1330  ? HCT 36.5 07/07/2017 1759  ? PLT 295 01/23/2021 1330  ? PLT 288 07/07/2017 1759  ? MCV 82.6 01/23/2021 1330  ? MCV 78 (L) 07/07/2017 1759  ? MCH 25.3 (L) 01/23/2021 1330  ? MCHC 30.7 01/23/2021 1330  ? RDW 15.6 (H) 01/23/2021 1330  ? RDW 15.4 07/07/2017 1759  ? LYMPHSABS 2.6 07/04/2020 1143  ? MONOABS 0.7 07/04/2020 1143  ? EOSABS 0.3 07/04/2020 1143  ? BASOSABS 0.0 07/04/2020 1143  ? ? ?BMET ?   ?Component Value Date/Time  ? NA 137 05/21/2021 1259  ? NA 142 06/02/2019 1656  ? K 4.2 05/21/2021 1259  ? CL 103 05/21/2021 1259  ? CO2 29 05/21/2021 1259  ? GLUCOSE 94 05/21/2021 1259  ? BUN 19 05/21/2021 1259  ? BUN 14 06/02/2019 1656  ? CREATININE 1.00 05/21/2021 1259  ? CREATININE 0.79 10/06/2015 0955  ? CALCIUM 9.7 05/21/2021 1259  ? GFRNONAA >60 01/23/2021 1330  ? GFRNONAA 83 10/06/2015 0955  ? GFRAA 87 06/02/2019 1656  ? GFRAA >89 10/06/2015 0955  ? ? ?BNP ?No results found for: BNP ? ?ProBNP ?No results found for: PROBNP ? ?Imaging: ?No results found. ? ? ?Assessment & Plan:  ? ?OSA (obstructive sleep apnea) ?- Patient has hx sleep apnea; NPSG in 2019 showed mild OSA, AHI 9.4.hr (REM AHI 56.4/hr). She was never started on CPAP. She has hx paroxsymal afib. Symptoms of insomnia and daytime sleepiness. Ep[place] 8. BMI 47. Recommending repeating HST to evaluate sleep apnea. We reviewed  risk of untreated sleep apnea and treatment options. If patient has moderate or severe OAS would recommend starting CPAP, she is hesitant d/t claustrophobia but is open to trying nasal mask. Encourage side sleeping position and weight loss. Advised against driving if experiencing excessive daytime sleepiness.  ? ?Obesity, Class III, BMI 40-49.9 (morbid obesity) ?- Encourage weight loss efforts  ? ?Paroxysmal A-fib ([place]) ?- Regular rate and rhythm on exam today. She is on Troprol-XL 25mg . Not on  anticoagulation.  ? ? ?Martyn Ehrich, NP ?07/18/2021 ? ?

## 2021-07-18 NOTE — Assessment & Plan Note (Signed)
-   Encourage weight loss efforts  ?

## 2021-07-18 NOTE — Patient Instructions (Addendum)
Insomnia: ?-Try melatonin 3-5 mg at bedtime  ? ?Sleep apnea: ?- Defined as period of 10 seconds or longer when you stop breathing at night. This can happen multiple times a night. Dx sleep apnea is when this occurs more than 5 times an hour.  ?  ?Mild OSA 5-15 apneic events an hour ?Moderate OSA 15-30 apneic events an hour ?Severe OSA > 30 apneic events an hour ?  ?Untreated sleep apnea puts you at higher risk for cardiac arrhythmias, pulmonary HTN, stroke and diabetes ?  ?Treatment options include weight loss, side sleeping position, oral appliance, CPAP therapy or referral to ENT for possible surgical options  ?  ?Recommendations: ?Focus on side sleeping position ?Work on weight loss efforts  ?Do not drive if experiencing excessive daytime sleepiness of fatigue  ?  ?Orders: ?Home sleep study re: snoring  ?  ?Follow-up: ?6 week visit to review sleep study results and discuss treatment options further ?

## 2021-07-25 ENCOUNTER — Emergency Department (HOSPITAL_BASED_OUTPATIENT_CLINIC_OR_DEPARTMENT_OTHER): Payer: BC Managed Care – PPO | Admitting: Radiology

## 2021-07-25 ENCOUNTER — Other Ambulatory Visit: Payer: Self-pay

## 2021-07-25 ENCOUNTER — Emergency Department (HOSPITAL_BASED_OUTPATIENT_CLINIC_OR_DEPARTMENT_OTHER)
Admission: EM | Admit: 2021-07-25 | Discharge: 2021-07-25 | Disposition: A | Discharge status: Home / Self Care | Payer: BC Managed Care – PPO | Attending: Emergency Medicine | Admitting: Emergency Medicine

## 2021-07-25 ENCOUNTER — Telehealth: Payer: Self-pay | Admitting: Family Medicine

## 2021-07-25 ENCOUNTER — Encounter (HOSPITAL_BASED_OUTPATIENT_CLINIC_OR_DEPARTMENT_OTHER): Payer: Self-pay | Admitting: Obstetrics and Gynecology

## 2021-07-25 DIAGNOSIS — R42 Dizziness and giddiness: Secondary | ICD-10-CM | POA: Insufficient documentation

## 2021-07-25 DIAGNOSIS — J45909 Unspecified asthma, uncomplicated: Secondary | ICD-10-CM | POA: Insufficient documentation

## 2021-07-25 DIAGNOSIS — Z79899 Other long term (current) drug therapy: Secondary | ICD-10-CM | POA: Diagnosis not present

## 2021-07-25 DIAGNOSIS — Z8679 Personal history of other diseases of the circulatory system: Secondary | ICD-10-CM

## 2021-07-25 DIAGNOSIS — I10 Essential (primary) hypertension: Secondary | ICD-10-CM | POA: Diagnosis not present

## 2021-07-25 DIAGNOSIS — R002 Palpitations: Secondary | ICD-10-CM | POA: Diagnosis not present

## 2021-07-25 DIAGNOSIS — R0600 Dyspnea, unspecified: Secondary | ICD-10-CM | POA: Insufficient documentation

## 2021-07-25 LAB — TROPONIN I (HIGH SENSITIVITY): Troponin I (High Sensitivity): 2 ng/L (ref <18)

## 2021-07-25 LAB — BASIC METABOLIC PANEL
Anion gap: 10 (ref 5–15)
BUN: 22 mg/dL (ref 8–23)
CO2: 25 mmol/L (ref 22–32)
Calcium: 10.4 mg/dL — ABNORMAL HIGH (ref 8.9–10.3)
Chloride: 103 mmol/L (ref 98–111)
Creatinine, Ser: 1.12 mg/dL — ABNORMAL HIGH (ref 0.44–1.00)
GFR, Estimated: 55 mL/min — ABNORMAL LOW (ref >60)
Glucose, Bld: 109 mg/dL — ABNORMAL HIGH (ref 70–99)
Potassium: 3.9 mmol/L (ref 3.5–5.1)
Sodium: 138 mmol/L (ref 135–145)

## 2021-07-25 LAB — CBC
HCT: 39.1 % (ref 36.0–46.0)
Hemoglobin: 12.3 g/dL (ref 12.0–15.0)
MCH: 24.8 pg — ABNORMAL LOW (ref 26.0–34.0)
MCHC: 31.5 g/dL (ref 30.0–36.0)
MCV: 78.8 fL — ABNORMAL LOW (ref 80.0–100.0)
Platelets: 298 10*3/uL (ref 150–400)
RBC: 4.96 MIL/uL (ref 3.87–5.11)
RDW: 15.2 % (ref 11.5–15.5)
WBC: 9.3 10*3/uL (ref 4.0–10.5)
nRBC: 0 % (ref 0.0–0.2)

## 2021-07-25 NOTE — Telephone Encounter (Signed)
Should pt be seen urgent care or first available OV? ? ?

## 2021-07-25 NOTE — Telephone Encounter (Signed)
Chief Complaint Heart palpitations or irregular heartbeat ?Reason for Call Symptomatic / Request for Health Information ?Initial Comment Caller states she has headache with elevated heart ?rate. ?Translation No ?Nurse Assessment ?Nurse: Lily Kocher, RN, Adriana Date/Time Lamount Cohen Time): 07/25/2021 2:29:32 PM ?Confirm and document reason for call. If ?symptomatic, describe symptoms. ?---pt states she has a history of afib. this morning hr ?was in the 110s. this afternoon while walking into a ?building it went up to 120, at rest it is about 100-113. ?felt dizziness and hard to breathe when walking into ?the building ?Does the patient have any new or worsening ?symptoms? ---Yes ?Will a triage be completed? ---Yes ?Related visit to physician within the last 2 weeks? ---No ?Does the PT have any chronic conditions? (i.e. ?diabetes, asthma, this includes High risk factors for ?pregnancy, etc.) ?---Yes ?List chronic conditions. ---migraines htn afib ?Is this a behavioral health or substance abuse call? ---No ?Guidelines ?Guideline Title Affirmed Question Affirmed Notes Nurse Date/Time (Eastern ?Time) ?Heart Rate and ?Heartbeat Questions ?Dizziness, ?lightheadedness, or ?weakness ?Lily Kocher, RN, Adriana 07/25/2021 2:31:53 ?PM ?PLEASE NOTE: All timestamps contained within this report are represented as [place] Standard Time. ?CONFIDENTIALTY NOTICE: This fax transmission is intended only for the addressee. It contains information that is legally privileged, confidential or ?otherwise protected from use or disclosure. If you are not the intended recipient, you are strictly prohibited from reviewing, disclosing, copying using ?or disseminating any of this information or taking any action in reliance on or regarding this information. If you have received this fax in error, please ?notify us immediately by telephone so that we can arrange for its return to [place]. Phone: [phone], Toll-Free: [phone], Fax: [phone] ?Page: 2 of 2 ?Call  Id: [id number] ?Disp. Time (Eastern ?Time) Disposition Final User ?07/25/2021 2:15:58 PM Attempt made - message left Lily Kocher, RN, Ricki Rodriguez ?07/25/2021 2:35:15 PM Go to ED Now Yes Lily Kocher, RN, Adriana ?Caller Disagree/Comply Comply ?Caller Understands Yes ?PreDisposition Call Doctor ?Care Advice Given Per Guideline ?GO TO ED NOW: CARE ADVICE given per Heart Rate and Heartbeat Questions (Adult) guideline. ANOTHER ADULT ?SHOULD DRIVE: * It is better and safer if another adult drives instead of you. NOTE TO TRIAGER - DRIVING: * Another adult ?should drive. ?Referrals ?[facility] - ED ?

## 2021-07-25 NOTE — ED Provider Notes (Signed)
?[facility] ?Provider Note ? ? ?CSN: [id number] ?Arrival date & time: 07/25/21  1528 ? ?  ? ?History ? ?Chief Complaint  ?Patient presents with  ? Palpitations  ? ? ?Mackenzie Everett is a 61 y.o. female. ? ?HPI ? ?  ? ? ?61 year old female with a history of hypertension, hyperlipidemia, asthma, paroxysmal atrial fibrillation, allergic rhinitis, hepatitis C, OSA presents with concern for 2 episodes of atrial fibrillation today, one lasting 45 minutes with associated symptoms at the time of dyspnea, lightheadedness. ? ?Sunday Migraine, still feeling weak from it the last few days, started on muscle relaxant for migraine from headache clinic, took one in AM, one 6PM on Sunday, has not taken more of that since then (xanaflex) ? ?This AM started having palpitations from afib, can always feel it, usually will do it then go away but this time was twice in a day which was unusual.  felt similar, sat down and it went away this AM but then returned today. ?Feelign weak all day off and on ?Went to walk to another building at work, HR 127 afib while walking, not usually that high, she looked pale ?Short of breath, felt fatigue, felt like breath knocked out, not chest pain or pressure, mild lightheadedness, no syncope ?Lasted a few seconds and then that got better ?Sat 10-15 minutes, HR decreased, 2 min walk to office and sat for another 5 minutes 113 ?45 minutes after that the afib stopped just prior to arrival today ? ?No fever, no nausea or vomiting, diarrhea, cough/congestion/recent illness ?No caffeine today  ?No recent etoh or other drugs, no other medication changes ? ?Had prior temporal temporal artery biopsy in November 2022 which was negative for temporal arteritis. ? ?No hx of blood clots leg or lungs/immobilization, 5hr drive 3.6IW ago, no leg pain or swelling, no fam hx of lbood clots  ?Prior to today no afib symptoms or dyspnea ? ?No hx of bleeding disorder/GI  bleeding ?Past Medical  History:  ?Diagnosis Date  ? Allergy   ? Arthritis   ? hands and knees  ? Asthma   ? exercise induced  ? Hyperlipidemia 09/04/2011  ? Hypertension   ?  ? ?Home Medications ?Prior to Admission medications   ?Medication Sig Start Date End Date Taking? Authorizing Provider  ?baclofen (LIORESAL) 10 MG tablet Take 10 mg by mouth 2 (two) times daily as needed for migraine. 12/05/20   [provider]  ?fluticasone (FLONASE) 50 MCG/ACT nasal spray Place 2 sprays into both nostrils daily as needed for allergies or rhinitis.    [provider]  ?losartan-hydrochlorothiazide (HYZAAR) 50-12.5 MG tablet Take 1 tablet by mouth daily. 05/03/21   Shade Flood, MD  ?metoprolol succinate (TOPROL-XL) 25 MG 24 hr tablet Take 1 tablet (25 mg total) by mouth at bedtime. 05/03/21   Shade Flood, MD  ?traMADol (ULTRAM) 50 MG tablet Take 1 tablet (50 mg total) by mouth every 6 (six) hours as needed for moderate pain or severe pain. 01/31/21   Abigail Miyamoto, MD  ?zonisamide (ZONEGRAN) 25 MG capsule Take 100 mg by mouth at bedtime. 01/01/21   [provider]  ?   ? ?Allergies    ?Caffeine and Penicillins   ? ?Review of Systems   ?Review of Systems ? ?Physical Exam ?Updated Vital Signs ?BP (!) 131/56 (BP Location: Right Arm)   Pulse 82   Temp 98 ?F (36.7 ?C)   Resp 20   Ht 5\' 10"  (1.778 m)  Wt (!) 151 kg   SpO2 100%   BMI 47.78 kg/m?  ?Physical Exam ?Vitals and nursing note reviewed.  ?Constitutional:   ?   General: She is not in acute distress. ?   Appearance: She is well-developed. She is not diaphoretic.  ?HENT:  ?   Head: Normocephalic and atraumatic.  ?Eyes:  ?   Conjunctiva/sclera: Conjunctivae normal.  ?Cardiovascular:  ?   Rate and Rhythm: Normal rate and regular rhythm.  ?   Heart sounds: Normal heart sounds. No murmur heard. ?  No friction rub. No gallop.  ?Pulmonary:  ?   Effort: Pulmonary effort is normal. No respiratory distress.  ?   Breath sounds: Normal breath sounds. No wheezing or  rales.  ?Abdominal:  ?   General: There is no distension.  ?   Palpations: Abdomen is soft.  ?   Tenderness: There is no abdominal tenderness. There is no guarding.  ?Musculoskeletal:     ?   General: No tenderness.  ?   Cervical back: Normal range of motion.  ?Skin: ?   General: Skin is warm and dry.  ?   Findings: No erythema or rash.  ?Neurological:  ?   Mental Status: She is alert and oriented to person, place, and time.  ? ? ?ED Results / Procedures / Treatments   ?Labs ?(all labs ordered are listed, but only abnormal results are displayed) ?Labs Reviewed  ?BASIC METABOLIC PANEL - Abnormal; Notable for the following components:  ?    Result Value  ? Glucose, Bld 109 (*)   ? Creatinine, Ser 1.12 (*)   ? Calcium 10.4 (*)   ? GFR, Estimated 55 (*)   ? All other components within normal limits  ?CBC - Abnormal; Notable for the following components:  ? MCV 78.8 (*)   ? MCH 24.8 (*)   ? All other components within normal limits  ?TROPONIN I (HIGH SENSITIVITY)  ? ? ?EKG ?EKG Interpretation ? ?Date/Time:  Wednesday July 25 2021 15:41:07 EDT ?Ventricular Rate:  77 ?PR Interval:  148 ?QRS Duration: 80 ?QT Interval:  342 ?QTC Calculation: 387 ?R Axis:   61 ?Text Interpretation: Normal sinus rhythm Nonspecific ST and T wave abnormality Abnormal ECG When compared with ECG of 11-Jun-2015 14:04, No significant change since last tracing Confirmed by Alvira Monday ([id number]) on 07/25/2021 4:46:05 PM ? ?Radiology ?DG Chest 2 View ? ?Result Date: 07/25/2021 ?CLINICAL DATA:  Palpitations EXAM: CHEST - 2 VIEW COMPARISON:  06/11/2015 chest radiograph. FINDINGS: Stable cardiomediastinal silhouette with normal heart size. No pneumothorax. No pleural effusion. Lungs appear clear, with no acute consolidative airspace disease and no pulmonary edema. IMPRESSION: No active cardiopulmonary disease. Electronically Signed   By: Delbert Phenix M.D.   On: 07/25/2021 15:59   ? ?Procedures ?Procedures  ? ? ?Medications Ordered in ED ?Medications -  No data to display ? ?ED Course/ Medical Decision Making/ A&P ?  ?                        ?Medical Decision Making ?Amount and/or Complexity of Data Reviewed ?Labs: ordered. ?Radiology: ordered. ? ? ?61 year old female with a history of hypertension, hyperlipidemia, asthma, paroxysmal atrial fibrillation, allergic rhinitis, hepatitis C, OSA presents with concern for 2 episodes of atrial fibrillation today, one lasting 45 minutes with associated symptoms at the time of dyspnea, lightheadedness. ? ?Reports she is able to tell when she goes into atrial fibrillation by feeling and also an apple watch  and today had 2 epsidoes with HR up to 127. Has episodes briefly about every 6 weeks, and is not on anticoagulation (CHADSVASc 2).   ? ?On arrival to the ED, EKG was personally evaluated by me and shows normal sinus rhythm without significant acute ST changes. ? ?Labs obtained to evaluate for underlying etiology show very mild aki, mild hypercalcemia (without albumin for correction.) Recommend follow up with PCP regarding this.  Troponin negative, doubt ACS.  Low suspicion for PE as she was only having dyspnea at the time of having afib, was able to walk the same distrance prior without having any dyspnea.  Possible that episode was triggered by recent migraine, dehydration, stress. Recommend continued hydration. Discussed possibility of anticoagulation given repeat episodes today ,however she would like to discuss more with Dr. Rosemary HolmsPatwardhan.  Contacted Dr. Rosemary HolmsPatwardhan who is planning on seeing her closely as an outpatient.   ? ? ? ? ? ? ? ?Final Clinical Impression(s) / ED Diagnoses ?Final diagnoses:  ?Palpitations  ?History of atrial fibrillation  ? ? ?Rx / DC Orders ?ED Discharge Orders   ? ? None  ? ?  ? ? ?  ?Alvira MondaySchlossman, Annesha Delgreco, MD ?07/26/21 [phone] ? ?

## 2021-07-25 NOTE — ED Triage Notes (Signed)
Patient reports she had been having headaches and was put on muscle relaxer's. Patient reports she took x2 doses and felt worse. Patient reports today when she was at work she was walking a short distance and felt like she was having palpitations and that she has a hx of A-fib. Patient reports she had a near syncopal event and had to sit down. Patient reports she went pale, denies sweating but reports she could feel her heart racing and it was 128 beats per minute.  ?

## 2021-07-25 NOTE — Telephone Encounter (Signed)
ER evaluation advised by team health nurse and it appears the patient is being currently evaluated through emergency room.  Thanks.  ?

## 2021-08-01 ENCOUNTER — Ambulatory Visit: Payer: BC Managed Care – PPO | Admitting: Student

## 2021-08-01 ENCOUNTER — Encounter: Payer: Self-pay | Admitting: Student

## 2021-08-01 VITALS — BP 137/70 | HR 79 | Temp 98.0°F | Resp 17 | Ht 70.0 in | Wt 333.0 lb

## 2021-08-01 DIAGNOSIS — R002 Palpitations: Secondary | ICD-10-CM

## 2021-08-01 DIAGNOSIS — I10 Essential (primary) hypertension: Secondary | ICD-10-CM

## 2021-08-01 DIAGNOSIS — R0609 Other forms of dyspnea: Secondary | ICD-10-CM

## 2021-08-01 NOTE — Progress Notes (Signed)
? ?Follow up visit ? ?Subjective:  ? ?Mackenzie Everett, female    DOB: 09-15-1960, 61 y.o.   MRN: [id number] ? ? ?HPI ? ? ?Chief Complaint  ?Patient presents with  ? Atrial Fibrillation  ?  1 WEEK  ? ? ?61 y.o. African American female with hypertension, hyperlipidemia, morbid obesity, prediabetes, sleep apnea, asthma, hepatitis C.  ? ?Patient was last seen in our office by Dr. Virgina Jock 06/22/2020.  At that time patient was low risk for upcoming surgery and stable from a cardiovascular standpoint.  She was therefore advised to follow-up as needed.  Patient was evaluated in the ED 07/25/2021 with primary complaint of palpitations.  Earlier that day patient's Apple Watch had alerted her to atrial fibrillation with heart rate of 127 bpm.  Evaluation in the emergency department showed no evidence of atrial fibrillation, chest x-ray and laboratory testing normal, troponin negative. ? ?Patient now presents for follow up.  Patient currently has sleep study pending to reevaluate sleep apnea.  She reports 2 episodes of elevated heart rate during which she felt some mild shortness of breath and unsteady on her feet.  Her smart watch noted "atrial fibrillation". ? ?Current Outpatient Medications on File Prior to Visit  ?Medication Sig Dispense Refill  ? fluticasone (FLONASE) 50 MCG/ACT nasal spray Place 2 sprays into both nostrils daily as needed for allergies or rhinitis.    ? losartan-hydrochlorothiazide (HYZAAR) 50-12.5 MG tablet Take 1 tablet by mouth daily. 90 tablet 2  ? metoprolol succinate (TOPROL-XL) 25 MG 24 hr tablet Take 1 tablet (25 mg total) by mouth at bedtime. 90 tablet 2  ? tiZANidine (ZANAFLEX) 4 MG tablet Take 4 mg by mouth 2 (two) times daily as needed.    ? traMADol (ULTRAM) 50 MG tablet Take 1 tablet (50 mg total) by mouth every 6 (six) hours as needed for moderate pain or severe pain. 20 tablet 0  ? zonisamide (ZONEGRAN) 100 MG capsule Take 100 mg by mouth daily.    ? baclofen (LIORESAL) 10 MG tablet Take 10  mg by mouth 2 (two) times daily as needed for migraine.    ? zonisamide (ZONEGRAN) 25 MG capsule Take 100 mg by mouth at bedtime.    ? ?No current facility-administered medications on file prior to visit.  ? ? ?Cardiovascular & other pertient studies: ?EKG 08/02/2021: ?Sinus rhythm at a rate of 78 bpm.  Normal axis.  Poor R wave progression, cannot exclude anteroseptal infarct old.  Nonspecific T wave abnormality.  ? ?EKG 06/22/2020: ?Sinus rhythm 77 bpm ?Borderline LVH ? ?Exercise treadmill stress test 06/2017: ?4.6 METS ?Nonspecific inferolateral ST changes ? ?Echocardiogram 06/2017: ?EF 55%. Mod LA dilatation ?Elevated right atrial pressure ? ?Recent labs: ? ?  Latest Ref Rng & Units 07/25/2021  ?  4:30 PM 05/21/2021  ? 12:59 PM 01/23/2021  ?  1:30 PM  ?CMP  ?Glucose 70 - 99 mg/dL 109   94   91    ?BUN 8 - 23 mg/dL _0 ?Creatinine 0.44 - 1.00 mg/dL 1.12   1.00   0.92    ?Sodium 135 - 145 mmol/L 138   137   139    ?Potassium 3.5 - 5.1 mmol/L 3.9   4.2   4.7    ?Chloride 98 - 111 mmol/L 103   103   109    ?CO2 22 - 32 mmol/L _1 ?Calcium 8.9 -  10.3 mg/dL 10.4   9.7   9.3    ?Total Protein 6.0 - 8.3 g/dL  7.9     ?Total Bilirubin 0.2 - 1.2 mg/dL  0.4     ?Alkaline Phos 39 - 117 U/L  77     ?AST 0 - 37 U/L  11     ?ALT 0 - 35 U/L  11     ? ? ?  Latest Ref Rng & Units 07/25/2021  ?  4:30 PM 01/23/2021  ?  1:30 PM 07/04/2020  ? 11:43 AM  ?CBC  ?WBC 4.0 - 10.5 K/uL 9.3   9.1   8.1    ?Hemoglobin 12.0 - 15.0 g/dL 12.3   11.5   11.5    ?Hematocrit 36.0 - 46.0 % 39.1   37.5   34.7    ?Platelets 150 - 400 K/uL 298   295   297.0    ? ?Lipid Panel  ?   ?Component Value Date/Time  ? CHOL 230 (H) 05/21/2021 1259  ? CHOL 226 (H) 06/02/2019 1656  ? TRIG 101.0 05/21/2021 1259  ? HDL 54.70 05/21/2021 1259  ? HDL 56 06/02/2019 1656  ? CHOLHDL 4 05/21/2021 1259  ? VLDL 20.2 05/21/2021 1259  ? [place] 155 (H) 05/21/2021 1259  ? [place] 150 (H) 06/02/2019 1656  ? ?HEMOGLOBIN A1C ?Lab Results  ?Component Value Date  ?  HGBA1C 6.3 05/21/2021  ? MPG 128 (H) 12/20/2014  ? ?TSH ?No results for input(s): TSH in the last 8760 hours. ? ?06/02/2019: ?Glucose 81, BUN/Cr 14/0.84. EGFR 87. Na/K 142/4.6. Rest of the CMP normal ?H/H 11.8/36.5. MCV 78. Platelets 288 ?HbA1C N/A ?Chol 226, TG 114, HDL 56, LDL 150 ? ?10/2018: ?TSH 1.0 ? ? ?Review of Systems  ?Cardiovascular:  Positive for palpitations. Negative for chest pain, leg swelling and syncope.  ?Respiratory:  Positive for shortness of breath.   ?Musculoskeletal:  Positive for joint pain.  ? ?   ? ? ?Vitals:  ? 08/01/21 1540 08/01/21 1544  ?BP: (!) 146/74 137/70  ?Pulse: 77 79  ?Resp: 17   ?Temp: 98 ?F (36.7 ?C)   ?SpO2: 94%   ? ? ?Body mass index is 47.78 kg/m?. ?Filed Weights  ? 08/01/21 1540  ?Weight: (!) 333 lb (151 kg)  ? ? ? ?Objective:  ? Physical Exam ?Vitals reviewed.  ?Constitutional:   ?   General: She is not in acute distress. ?Neck:  ?   Vascular: No JVD.  ?Cardiovascular:  ?   Rate and Rhythm: Normal rate and regular rhythm.  ?   Pulses: Intact distal pulses.  ?   Heart sounds: Normal heart sounds, S1 normal and S2 normal. No murmur heard. ?  No gallop.  ?Pulmonary:  ?   Effort: Pulmonary effort is normal. No respiratory distress.  ?   Breath sounds: Normal breath sounds. No wheezing, rhonchi or rales.  ?Musculoskeletal:  ?   Right lower leg: No edema.  ?   Left lower leg: No edema.  ?Neurological:  ?   Mental Status: She is alert.  ? ? ?   ?Assessment & Recommendations:  ? ?61 y.o. African American female with hypertension, hyperlipidemia, morbid obesity, prediabetes, sleep apnea, asthma, hepatitis C.  ? ?Palpitations/tachycardia: ?This patients CHA2DS2-VASc Score 2 (HTN, F) and yearly risk of stroke 2.2%.  ?Patient reports a history of paroxysmal atrial fibrillation, and this is noted in previous notes from external providers.  However I extensively reviewed patient's records including EKGs as far  back as 2017 and previous cardiac testing.  There is no evidence of documented  atrial fibrillation. ?Given the lack of documented atrial fibrillation CHA2DS2-VASc score relatively low at 2 even if she does have atrial fibrillation shared decision was to hold off on medication changes at this time.  No indication for anticoagulation given that there is no documented history of paroxysmal atrial fibrillation on EKG or cardiac monitoring. ?Obtain 2-week cardiac telemetry, further recommendations pending these results. ?Given dyspnea associated with tachycardia will obtain echocardiogram as well. ? ?Hypertension: ?Patient's blood pressure is presently well controlled. ? ?Continue losartan/HCTZ and metoprolol.  Follow-up in 6 weeks, sooner if needed. ? ? ?Alethia Berthold, PA-C ?08/02/2021, 12:36 PM ?Office: [phone] ?

## 2021-08-02 ENCOUNTER — Telehealth: Payer: Self-pay | Admitting: Primary Care

## 2021-08-03 NOTE — Telephone Encounter (Signed)
Pt's OV has been printed to be faxed at provided fax number. Nothing further needed. ?

## 2021-08-03 NOTE — Telephone Encounter (Signed)
[facility]. The office was closed. Will call back Monday when they open.  ?

## 2021-08-08 ENCOUNTER — Ambulatory Visit: Payer: BC Managed Care – PPO

## 2021-08-08 ENCOUNTER — Inpatient Hospital Stay: Payer: BC Managed Care – PPO

## 2021-08-08 DIAGNOSIS — R0609 Other forms of dyspnea: Secondary | ICD-10-CM

## 2021-08-08 DIAGNOSIS — R002 Palpitations: Secondary | ICD-10-CM

## 2021-08-13 ENCOUNTER — Telehealth: Payer: Self-pay | Admitting: Family Medicine

## 2021-08-13 NOTE — Telephone Encounter (Signed)
Patient called and stated that when she called the Rheumatologist to check on scheduling her referral appt they told her they had not received everything they needed. Stated they were missing the Sed Rate results and the Surg. Path. Results from [facility]. I printed both and faxed those over to the rheum. office ?

## 2021-09-12 ENCOUNTER — Ambulatory Visit: Payer: BC Managed Care – PPO | Admitting: Student

## 2021-09-12 ENCOUNTER — Encounter: Payer: Self-pay | Admitting: Student

## 2021-09-12 VITALS — BP 135/78 | HR 76 | Temp 98.7°F | Resp 16 | Ht 70.0 in | Wt 337.2 lb

## 2021-09-12 DIAGNOSIS — R002 Palpitations: Secondary | ICD-10-CM

## 2021-09-12 DIAGNOSIS — R0609 Other forms of dyspnea: Secondary | ICD-10-CM

## 2021-09-12 NOTE — Progress Notes (Signed)
Follow up visit  Subjective:   Mackenzie Everett, female    DOB: 1961-02-14, 61 y.o.   MRN: [id number]   HPI   Chief Complaint  Patient presents with   Hypertension   Follow-up    61 y.o. African American female with hypertension, hyperlipidemia, morbid obesity, prediabetes, sleep apnea, asthma, hepatitis C.   Patient was last seen in the office 08/01/2021 at which time ordered 2-week cardiac monitor to evaluate for atrial fibrillation.  Also ordered echocardiogram given dyspnea and tachycardia.  Echocardiogram revealed LVEF of 65% with mild valvular disease and otherwise unremarkable.  Cardiac monitor showed no evidence of atrial fibrillation, 2 episodes of SVT with the longest lasting 7 beats, rare PACs and PVCs.  On the monitor patient triggered events correlated with normal sinus rhythm.  Patient now presents for follow-up.  Patient has had improvement of palpitations and dyspnea since last office visit.  She is overall feeling well.  Current Outpatient Medications on File Prior to Visit  Medication Sig Dispense Refill   fluticasone (FLONASE) 50 MCG/ACT nasal spray Place 2 sprays into both nostrils daily as needed for allergies or rhinitis.     losartan-hydrochlorothiazide (HYZAAR) 50-12.5 MG tablet Take 1 tablet by mouth daily. 90 tablet 2   metoprolol succinate (TOPROL-XL) 25 MG 24 hr tablet Take 1 tablet (25 mg total) by mouth at bedtime. 90 tablet 2   naratriptan (AMERGE) 2.5 MG tablet Take 2.5 mg by mouth as needed for migraine. Take one (1) tablet at onset of headache; if returns or does not resolve, may repeat after 4 hours; do not exceed five (5) mg in 24 hours.     traMADol (ULTRAM) 50 MG tablet Take 1 tablet (50 mg total) by mouth every 6 (six) hours as needed for moderate pain or severe pain. 20 tablet 0   zonisamide (ZONEGRAN) 100 MG capsule Take 100 mg by mouth daily.     No current facility-administered medications on file prior to visit.    Cardiovascular & other  pertient studies: Ambulatory cardiac telemetry 14 days (08/08/2021 - 08/22/2021): Predominant underlying rhythm was sinus.  Minimum heart rate 51 bpm, maximum heart rate 152 bpm, average heart rate 80 bpm.  2 episodes of SVT with the longest lasting 7 beats.  Rare PACs, rare PVCs, ventricular bigeminy was present.  Patient triggered events correlated with normal sinus rhythm.  No evidence of high degree AV block, atrial fibrillation, ventricular tachycardia, or pauses >3 seconds.  Echocardiogram 08/08/2021:  Left ventricle cavity is normal in size and wall thickness. Normal global  wall motion. Normal LV systolic function with EF 65%. Normal diastolic  filling pattern.  Mild (Grade I) mitral regurgitation.  Mild tricuspid regurgitation.  No evidence of pulmonary hypertension.  No significant change compared to previous study on 07/04/2020.  EKG 08/02/2021: Sinus rhythm at a rate of 78 bpm.  Normal axis.  Poor R wave progression, cannot exclude anteroseptal infarct old.  Nonspecific T wave abnormality.   EKG 06/22/2020: Sinus rhythm 77 bpm Borderline LVH  Exercise treadmill stress test 06/2017: 4.6 METS Nonspecific inferolateral ST changes  Recent labs:    Latest Ref Rng & Units 07/25/2021    4:30 PM 05/21/2021   12:59 PM 01/23/2021    1:30 PM  CMP  Glucose 70 - 99 mg/dL 109  94  91   BUN 8 - 23 mg/dL '22  19  19   ' Creatinine 0.44 - 1.00 mg/dL 1.12  1.00  0.92   Sodium 135 -  145 mmol/L 138  137  139   Potassium 3.5 - 5.1 mmol/L 3.9  4.2  4.7   Chloride 98 - 111 mmol/L 103  103  109   CO2 22 - 32 mmol/L '25  29  23   ' Calcium 8.9 - 10.3 mg/dL 10.4  9.7  9.3   Total Protein 6.0 - 8.3 g/dL  7.9    Total Bilirubin 0.2 - 1.2 mg/dL  0.4    Alkaline Phos 39 - 117 U/L  77    AST 0 - 37 U/L  11    ALT 0 - 35 U/L  11        Latest Ref Rng & Units 07/25/2021    4:30 PM 01/23/2021    1:30 PM 07/04/2020   11:43 AM  CBC  WBC 4.0 - 10.5 K/uL 9.3  9.1  8.1   Hemoglobin 12.0 - 15.0 g/dL 12.3  11.5   11.5   Hematocrit 36.0 - 46.0 % 39.1  37.5  34.7   Platelets 150 - 400 K/uL 298  295  297.0    Lipid Panel     Component Value Date/Time   CHOL 230 (H) 05/21/2021 1259   CHOL 226 (H) 06/02/2019 1656   TRIG 101.0 05/21/2021 1259   HDL 54.70 05/21/2021 1259   HDL 56 06/02/2019 1656   CHOLHDL 4 05/21/2021 1259   VLDL 20.2 05/21/2021 1259   LDLCALC 155 (H) 05/21/2021 1259   LDLCALC 150 (H) 06/02/2019 1656   HEMOGLOBIN A1C Lab Results  Component Value Date   HGBA1C 6.3 05/21/2021   MPG 128 (H) 12/20/2014   TSH No results for input(s): "TSH" in the last 8760 hours.  06/02/2019: Glucose 81, BUN/Cr 14/0.84. EGFR 87. Na/K 142/4.6. Rest of the CMP normal H/H 11.8/36.5. MCV 78. Platelets 288 HbA1C N/A Chol 226, TG 114, HDL 56, LDL 150  10/2018: TSH 1.0   Review of Systems  Cardiovascular:  Negative for chest pain, leg swelling, palpitations (improved) and syncope.  Respiratory:  Positive for shortness of breath (improved).   Musculoskeletal:  Positive for joint pain.         Vitals:   09/12/21 1526  BP: 135/78  Pulse: 76  Resp: 16  Temp: 98.7 F (37.1 C)  SpO2: 98%    Body mass index is 48.38 kg/m. Filed Weights   09/12/21 1526  Weight: (!) 337 lb 3.2 oz (153 kg)     Objective:   Physical Exam Vitals reviewed.  Constitutional:      General: She is not in acute distress. Neck:     Vascular: No JVD.  Cardiovascular:     Rate and Rhythm: Normal rate and regular rhythm.     Pulses: Intact distal pulses.     Heart sounds: Normal heart sounds, S1 normal and S2 normal. No murmur heard.    No gallop.  Pulmonary:     Effort: Pulmonary effort is normal. No respiratory distress.     Breath sounds: Normal breath sounds. No wheezing, rhonchi or rales.  Musculoskeletal:     Right lower leg: No edema.     Left lower leg: No edema.  Neurological:     Mental Status: She is alert.   Physical exam unchanged compared to previous office visit.     Assessment &  Recommendations:   61 y.o. African American female with hypertension, hyperlipidemia, morbid obesity, prediabetes, sleep apnea, asthma, hepatitis C.   Palpitations/tachycardia: This patients CHA2DS2-VASc Score 2 (HTN, F) and yearly risk  of stroke 2.2%.  Patient reports a history of paroxysmal atrial fibrillation, and this is noted in previous notes from external providers.  However I extensively reviewed patient's records including EKGs as far back as 2017 and previous cardiac testing.  There is no evidence of documented atrial fibrillation.  She also underwent 2-week cardiac monitor, details above which revealed no evidence of atrial fibrillation. No indication for anticoagulation at this time. Dyspnea and tachycardia have improved since last office visit.  Reviewed and discussed results of echocardiogram, details above.  Hypertension: Well-controlled Continue losartan/HCTZ and metoprolol.    Follow-up in 1 year, sooner if needed.   Alethia Berthold, PA-C 09/12/2021, 3:57 PM Office: [phone]

## 2021-09-19 ENCOUNTER — Ambulatory Visit: Payer: BC Managed Care – PPO

## 2021-09-19 DIAGNOSIS — Z8669 Personal history of other diseases of the nervous system and sense organs: Secondary | ICD-10-CM

## 2021-09-19 DIAGNOSIS — G4733 Obstructive sleep apnea (adult) (pediatric): Secondary | ICD-10-CM | POA: Diagnosis not present

## 2021-10-01 ENCOUNTER — Telehealth: Payer: Self-pay | Admitting: Pulmonary Disease

## 2021-10-01 DIAGNOSIS — G4733 Obstructive sleep apnea (adult) (pediatric): Secondary | ICD-10-CM | POA: Diagnosis not present

## 2021-10-01 NOTE — Telephone Encounter (Signed)
I left a message to call back for results per DPR.

## 2021-10-01 NOTE — Telephone Encounter (Signed)
Call patient  Sleep study result  Date of study: 09/19/2021  Impression: Mild obstructive sleep apnea Mild oxygen desaturations  Recommendation: Options of treatment for mild obstructive sleep apnea will include  1.  CPAP therapy if there is significant daytime sleepiness or other comorbidities including history of CVA or cardiac disease  -If CPAP is chosen as an option of treatment auto titrating CPAP with a pressure setting of 5-15 will be appropriate  2.  Watchful waiting with emphasis on weight loss measures, sleep position modification to optimize lateral sleep, elevating the head of the bed by about 30 degrees may also help.  3.  An oral device may be fashioned for the treatment of mild sleep disordered breathing, will involve referral to dentist.  Follow-up as previously scheduled

## 2021-10-04 NOTE — Telephone Encounter (Signed)
Pt scheduled for OV with Beth on 7/18 at 4:30.

## 2021-10-04 NOTE — Telephone Encounter (Signed)
Please call the patient to make a follow up in office to discuss her results and treatment with Ames Dura.

## 2021-10-04 NOTE — Telephone Encounter (Signed)
Need OV to discuss results and treatment

## 2021-10-16 ENCOUNTER — Encounter: Payer: Self-pay | Admitting: Primary Care

## 2021-10-16 ENCOUNTER — Ambulatory Visit: Payer: BC Managed Care – PPO | Admitting: Primary Care

## 2021-10-16 DIAGNOSIS — G4733 Obstructive sleep apnea (adult) (pediatric): Secondary | ICD-10-CM

## 2021-10-16 NOTE — Progress Notes (Signed)
@Patient  ID: , female    DOB: [birth year], 61 y.o.   MRN: [id number]  No chief complaint on file.   Referring provider: [id number], MD  HPI: 62 year old female, never smoked. PMH significant for HTN, paroxysmal afib, allergic rhinitis, asthma, hep C, obesity, hyperlipidemia.   07/18/2021 Patient presents today for sleep consult. Patient has a hx of sleep apnea. She had polysomnogram in 2019 that showed mild obstructive sleep apnea, severe in REM sleep and during spine sleep. Total AHI 9.4/hr and REM AHI 56.4 and supine AHI 30.4/hr and O2 nadir 83%. It was recommended that she had CPAP titration study but this was never completed. She was never started on CPAP therapy. She is very claustrophobic and does not feel she could wear a CPAP at night. She states that she may be able to tolerate a nasal mask. She is a side sleeper.  Denies narcolepsy, cataplexy, sleep walking.   Sleep questionnaire Symptoms- Insomnia, daytime sleepiness  Prior sleep study- 2019 mild OSA, AHI 9.4/hr  Bedtime- 11-11:30pm Time to fall asleep- varies; most of the times 30 mins, other times hours  Nocturnal awakenings- 1-3 Out of bed/start of day- 5:30am  Weight changes- No  Do you operate heavy machinery- No Do you currently wear CPAP- No Do you current wear oxygen- No [place]- 8  10/16/2021 Patient presents today to review sleep study results.  She had a home sleep study on 09/19/2021 that showed evidence of mild obstructive sleep apnea, AHI 11.1/hour with SPO2 low 81% (average 93%).  Reviewed sleep study results with patient treatment options.  Patient does not have significant daytime symptoms.  At this time CPAP treatment is not needed.  She also is claustrophobic and unlikely to tolerate.  Recommending weight loss and avoiding supine sleep position.  May want to consider oral appliance to help with snoring and mild OSA.  Her sleep schedule does not vary at times, she typically goes to bed around  midnight but can go to sleep later if watching movies with friends. She has been taking over-the-counter melatonin which has been helping with insomnia symptoms.  Allergies  Allergen Reactions   Caffeine Other (See Comments)    migraines   Penicillins Hives    Tolerated 3 grams of ancef 01/31/2021    Immunization History  Administered Date(s) Administered   Influenza Split 02/26/2012   Influenza, Seasonal, Injecte, Preservative Fre 02/26/2012, 12/20/2014   Influenza,inj,Quad PF,6+ Mos 12/20/2014, 04/29/2016, 04/15/2017, 01/10/2018, 11/19/2018, 01/25/2020, 05/02/2021   Influenza-Unspecified 02/26/2012, 12/20/2014   PFIZER(Purple Top)SARS-COV-2 Vaccination 05/28/2019, 06/18/2019, 03/13/2020, 08/29/2020   Tdap 12/20/2014   Zoster Recombinat (Shingrix) 11/27/2000, 11/19/2018    Past Medical History:  Diagnosis Date   Allergy    Arthritis    hands and knees   Asthma    exercise induced   Hyperlipidemia 09/04/2011   Hypertension     Tobacco History: Social History   Tobacco Use  Smoking Status Never  Smokeless Tobacco Never   Counseling given: Not Answered   Outpatient Medications Prior to Visit  Medication Sig Dispense Refill   fluticasone (FLONASE) 50 MCG/ACT nasal spray Place 2 sprays into both nostrils daily as needed for allergies or rhinitis.     losartan-hydrochlorothiazide (HYZAAR) 50-12.5 MG tablet Take 1 tablet by mouth daily. 90 tablet 2   metoprolol succinate (TOPROL-XL) 25 MG 24 hr tablet Take 1 tablet (25 mg total) by mouth at bedtime. 90 tablet 2   naratriptan (AMERGE) 2.5 MG tablet Take 2.5 mg  by mouth as needed for migraine. Take one (1) tablet at onset of headache; if returns or does not resolve, may repeat after 4 hours; do not exceed five (5) mg in 24 hours.     traMADol (ULTRAM) 50 MG tablet Take 1 tablet (50 mg total) by mouth every 6 (six) hours as needed for moderate pain or severe pain. 20 tablet 0   zonisamide (ZONEGRAN) 100 MG capsule Take 100 mg  by mouth daily.     No facility-administered medications prior to visit.      Review of Systems  Review of Systems  Constitutional: Negative.  Negative for fatigue.  HENT: Negative.    Respiratory: Negative.       Physical Exam  BP 116/68 (BP Location: Left Wrist, Patient Position: Sitting, Cuff Size: Normal)   Pulse 83   Temp 98.1 F (36.7 C) (Oral)   Ht 5\' 10"  (1.778 m)   Wt (!) 329 lb 6.4 oz (149.4 kg)   SpO2 98%   BMI 47.26 kg/m  Physical Exam Constitutional:      Appearance: Normal appearance. She is obese.  HENT:     Head: Normocephalic and atraumatic.     Mouth/Throat:     Mouth: Mucous membranes are moist.     Pharynx: Oropharynx is clear.  Cardiovascular:     Rate and Rhythm: Normal rate and regular rhythm.  Pulmonary:     Effort: Pulmonary effort is normal.     Breath sounds: Normal breath sounds.  Skin:    General: Skin is warm and dry.  Neurological:     General: No focal deficit present.     Mental Status: She is alert and oriented to person, place, and time. Mental status is at baseline.  Psychiatric:        Mood and Affect: Mood normal.        Behavior: Behavior normal.        Thought Content: Thought content normal.        Judgment: Judgment normal.      Lab Results:  CBC    Component Value Date/Time   WBC 9.3 07/25/2021 1630   RBC 4.96 07/25/2021 1630   HGB 12.3 07/25/2021 1630   HGB 11.8 07/07/2017 1759   HCT 39.1 07/25/2021 1630   HCT 36.5 07/07/2017 1759   PLT 298 07/25/2021 1630   PLT 288 07/07/2017 1759   MCV 78.8 (L) 07/25/2021 1630   MCV 78 (L) 07/07/2017 1759   MCH 24.8 (L) 07/25/2021 1630   MCHC 31.5 07/25/2021 1630   RDW 15.2 07/25/2021 1630   RDW 15.4 07/07/2017 1759   LYMPHSABS 2.6 07/04/2020 1143   MONOABS 0.7 07/04/2020 1143   EOSABS 0.3 07/04/2020 1143   BASOSABS 0.0 07/04/2020 1143    BMET    Component Value Date/Time   NA 138 07/25/2021 1630   NA 142 06/02/2019 1656   K 3.9 07/25/2021 1630   CL 103  07/25/2021 1630   CO2 25 07/25/2021 1630   GLUCOSE 109 (H) 07/25/2021 1630   BUN 22 07/25/2021 1630   BUN 14 06/02/2019 1656   CREATININE 1.12 (H) 07/25/2021 1630   CREATININE 0.79 10/06/2015 0955   CALCIUM 10.4 (H) 07/25/2021 1630   GFRNONAA 55 (L) 07/25/2021 1630   GFRNONAA 83 10/06/2015 0955   GFRAA 87 06/02/2019 1656   GFRAA >89 10/06/2015 0955    BNP No results found for: "BNP"  ProBNP No results found for: "PROBNP"  Imaging: No results found.  Assessment & Plan:   OSA (obstructive sleep apnea) - HST on 09/19/2021 showed mild OSA, AHI 11.1/hr. She has no significant daytime sleepiness. Insomnia has improved with over-the-counter melatonin use.  Reviewed sleep study results and treatment options.  Patient unlikely to tolerate CPAP due to claustrophobia.  Recommend weight loss and avoiding supine sleep position.  May consider oral appliance in the future for treatment of snoring and mild OSA.  Patient to follow-up in 6 months or if needed.  Obesity, Class III, BMI 40-49.9 (morbid obesity) - Patient to discuss weight loss options/medication with primary care physician at visit in 2 weeks      Glenford Bayley, NP 10/16/2021

## 2021-10-16 NOTE — Assessment & Plan Note (Signed)
-   HST on 09/19/2021 showed mild OSA, AHI 11.1/hr. She has no significant daytime sleepiness. Insomnia has improved with over-the-counter melatonin use.  Reviewed sleep study results and treatment options.  Patient unlikely to tolerate CPAP due to claustrophobia.  Recommend weight loss and avoiding supine sleep position.  May consider oral appliance in the future for treatment of snoring and mild OSA.  Patient to follow-up in 6 months or if needed.

## 2021-10-16 NOTE — Patient Instructions (Signed)
Sleep study showed mild OSA, you had on average 11 apneic/hypopneic events an hour   Mild OSA 5-15 apneic events an hour Moderate OSA 15-30 apneic events an hour Severe OSA > 30 apneic events an hour   Untreated sleep apnea puts you at higher risk for cardiac arrhythmias, pulmonary HTN, stroke and diabetes   Treatment options include weight loss, side sleeping position, oral appliance, CPAP therapy or referral to ENT for possible surgical options   Recommendations: - Weight loss and get wedge pillow to use to elevate head 30 degrees while sleeping - Consider oral appliance for snoring, let us know if you want to do this and we can refer you to local orthodontics who specialized in sleep apnea   Follow-up:  6 months or sooner if needed

## 2021-10-16 NOTE — Assessment & Plan Note (Addendum)
-   Patient to discuss weight loss options/medication with primary care physician at visit in 2 weeks

## 2021-10-29 ENCOUNTER — Ambulatory Visit: Payer: BC Managed Care – PPO | Admitting: Family Medicine

## 2021-10-29 ENCOUNTER — Encounter: Payer: Self-pay | Admitting: Family Medicine

## 2021-10-29 VITALS — BP 128/88 | HR 67 | Temp 98.7°F | Resp 17 | Ht 70.0 in | Wt 332.0 lb

## 2021-10-29 DIAGNOSIS — Z6841 Body Mass Index (BMI) 40.0 and over, adult: Secondary | ICD-10-CM

## 2021-10-29 DIAGNOSIS — R21 Rash and other nonspecific skin eruption: Secondary | ICD-10-CM

## 2021-10-29 DIAGNOSIS — R7303 Prediabetes: Secondary | ICD-10-CM

## 2021-10-29 DIAGNOSIS — E785 Hyperlipidemia, unspecified: Secondary | ICD-10-CM

## 2021-10-29 DIAGNOSIS — G4733 Obstructive sleep apnea (adult) (pediatric): Secondary | ICD-10-CM

## 2021-10-29 DIAGNOSIS — I10 Essential (primary) hypertension: Secondary | ICD-10-CM | POA: Diagnosis not present

## 2021-10-29 DIAGNOSIS — Z87898 Personal history of other specified conditions: Secondary | ICD-10-CM | POA: Diagnosis not present

## 2021-10-29 LAB — HEMOGLOBIN A1C: Hgb A1c MFr Bld: 6.4 % (ref 4.6–6.5)

## 2021-10-29 MED ORDER — LOSARTAN POTASSIUM-HCTZ 50-12.5 MG PO TABS: 1.0000 | ORAL_TABLET | Freq: Every day | ORAL | 2 refills | Status: DC

## 2021-10-29 MED ORDER — TRIAMCINOLONE ACETONIDE 0.1 % EX CREA: 1.0000 | TOPICAL_CREAM | Freq: Two times a day (BID) | CUTANEOUS | 0 refills | Status: AC

## 2021-10-29 MED ORDER — METOPROLOL SUCCINATE ER 25 MG PO TB24: 25.0000 mg | ORAL_TABLET | Freq: Every day | ORAL | 2 refills | Status: DC

## 2021-10-29 NOTE — Progress Notes (Signed)
Subjective:  Patient ID: Mackenzie Everett, female    DOB: [birth year]  Age: 61 y.o. MRN: [id number]  CC:  Chief Complaint  Patient presents with   Hypertension   Rash    Pt notes rash on Rt lower leg with small bumps, scally and itchy, intermittent, apx 6 months     HPI Mackenzie Everett presents for   Hypertension: Toprol-XL 25 mg daily, losartan HCTZ 50/12.5 mg daily. History of headaches, temporal artery biopsy last year normal without sign of arteritis.  Zonisamide for headaches, under care of headache specialist Dr. Neale Burly ER visit in April for palpitations.  Negative troponin at that time.  History of paroxysmal atrial fibrillation OSA, and possible episode of A-fib.Possible trigger from recent migraine, dehydration, stress.  Follow-up with cardiology May 3 -no documented evidence of atrial fibrillation.  Borderline CHA2DS2-VASc score, no indication of anticoagulation without documented history of A-fib on EKG or cardiac monitoring.  Repeat cardiology visit June 14 noted, echo with EF 65%, mild valvular disease but otherwise unremarkable.  Cardiac monitor without episodes of A-fib.  There were 2 episodes of SVT longest lasting 7 beats, rare PACs and PVCs.  No anticoagulation recommended.  Symptoms had improved.  1 year follow-up planned, no med changes.  Pulmonary visit July 18 for OSA, with mild OSA of AHI 11.  Insomnia treated with melatonin.  Plan for weight loss and avoiding supine sleep position, option of oral appliance, deferred CPAP due to claustrophobia, 37-month follow-up planned. Has not had repeat symptoms since ER, sometimes feels heart pounding at times and has to sit down - temporary. Has discussed with cardiology - no new meds, planned continued monitoring and continued toprol. No chest pains.  Home readings: none.  No new med side effects.   Cruise next week to [place], [place].   BP Readings from Last 3 Encounters:  10/29/21 128/88  10/16/21 116/68  09/12/21 135/78   Lab  Results  Component Value Date   CREATININE 1.12 (H) 07/25/2021   Prediabetes: Diet/exercise approach.  Weight has overall remained stable from February 1 visit. Glucose 109 in ER 07/25/21.  Has tried to adjust diet - minimal effect. Would like to meet with weight specialist.  Plans to sign up for gym. Retired now. Goal of exercise 2-3 times per week, some increased responsibility with mother's care.  Would like try med to help with weight loss.  No hx of MEN syndrome, thyroid CA, no personal hx of pancreatitis.  Lab Results  Component Value Date   HGBA1C 6.3 05/21/2021   Wt Readings from Last 3 Encounters:  10/29/21 (!) 332 lb (150.6 kg)  10/16/21 (!) 329 lb 6.4 oz (149.4 kg)  09/12/21 (!) 337 lb 3.2 oz (153 kg)   Hyperlipidemia: Diet/exercise approach planned.  Did have elevated ASCVD score but borderline at 10.2% in February. Diet change as above and planned exercise.  Lab Results  Component Value Date   CHOL 230 (H) 05/21/2021   HDL 54.70 05/21/2021   LDLCALC 155 (H) 05/21/2021   TRIG 101.0 05/21/2021   CHOLHDL 4 05/21/2021   Lab Results  Component Value Date   ALT 11 05/21/2021   AST 11 05/21/2021   ALKPHOS 77 05/21/2021   BILITOT 0.4 05/21/2021   Right leg rash Noted past 6 months, intermittent pruritus, scaly appearance. Front of lower leg. Clear bumps or red patches at times. Itching at times, dry at times.  Attempted treatments: aquaphor, cortisone, gold bond.  No pets at home.  History Patient Active Problem List   Diagnosis Date Noted   OSA (obstructive sleep apnea) 07/18/2021   Paroxysmal A-fib (HCC) 07/18/2021   Hypertension 06/24/2020   Pre-op evaluation 06/22/2020   Hepatitis C 01/19/2016   Obesity, Class III, BMI 40-49.9 (morbid obesity) (HCC) 09/04/2011   Hyperlipidemia 09/04/2011   Migraine variant 09/04/2011   Asthma with allergic rhinitis 09/04/2011   Degenerative disc disease, lumbar 09/04/2011   Past Medical History:  Diagnosis  Date   Allergy    Arthritis    hands and knees   Asthma    exercise induced   Hyperlipidemia 09/04/2011   Hypertension    Past Surgical History:  Procedure Laterality Date   APPENDECTOMY  1998   ARTERY BIOPSY Left 01/31/2021   Procedure: LEFT TEMPORAL ARTERY BIOPSY;  Surgeon: Abigail Miyamoto, MD;  Location: [facility];  Service: General;  Laterality: Left;   BREAST CYST EXCISION Right 1980   KNEE ARTHROSCOPY Right 06/2020   KNEE SURGERY Left 1994   TUBAL LIGATION  1992   Allergies  Allergen Reactions   Caffeine Other (See Comments)    migraines   Penicillins Hives    Tolerated 3 grams of ancef 01/31/2021   Prior to Admission medications   Medication Sig Start Date End Date Taking? Authorizing Provider  fluticasone (FLONASE) 50 MCG/ACT nasal spray Place 2 sprays into both nostrils daily as needed for allergies or rhinitis.    [provider]  losartan-hydrochlorothiazide (HYZAAR) 50-12.5 MG tablet Take 1 tablet by mouth daily. 05/03/21   Shade Flood, MD  metoprolol succinate (TOPROL-XL) 25 MG 24 hr tablet Take 1 tablet (25 mg total) by mouth at bedtime. 05/03/21   Shade Flood, MD  naratriptan (AMERGE) 2.5 MG tablet Take 2.5 mg by mouth as needed for migraine. Take one (1) tablet at onset of headache; if returns or does not resolve, may repeat after 4 hours; do not exceed five (5) mg in 24 hours.    [provider]  traMADol (ULTRAM) 50 MG tablet Take 1 tablet (50 mg total) by mouth every 6 (six) hours as needed for moderate pain or severe pain. 01/31/21   Abigail Miyamoto, MD  zonisamide (ZONEGRAN) 100 MG capsule Take 100 mg by mouth daily. 07/03/21   [provider]   Social History   Socioeconomic History   Marital status: Single    Spouse name: Not on file   Number of children: 4   Years of education: Not on file   Highest education level: Not on file  Occupational History   Not on file  Tobacco Use   Smoking status: Never   Smokeless  tobacco: Never  Vaping Use   Vaping Use: Never used  Substance and Sexual Activity   Alcohol use: Yes    Comment: social   Drug use: No   Sexual activity: Yes    Birth control/protection: Post-menopausal  Other Topics Concern   Not on file  Social History Narrative   Lives at home alone.   Social Determinants of Health   Financial Resource Strain: Not on file  Food Insecurity: Not on file  Transportation Needs: Not on file  Physical Activity: Not on file  Stress: Not on file  Social Connections: Not on file  Intimate Partner Violence: Not on file    Review of Systems  Constitutional:  Negative for fatigue and unexpected weight change.  Respiratory:  Negative for chest tightness and shortness of breath.   Cardiovascular:  Positive for palpitations.  Negative for chest pain and leg swelling.  Gastrointestinal:  Negative for abdominal pain and blood in stool.  Neurological:  Negative for dizziness, syncope, light-headedness and headaches.     Objective:   Vitals:   10/29/21 0945  BP: 128/88  Pulse: 67  Resp: 17  Temp: 98.7 F (37.1 C)  TempSrc: Oral  SpO2: 97%  Weight: (!) 332 lb (150.6 kg)  Height: 5\' 10"  (1.778 m)  Body mass index is 47.64 kg/m.    Physical Exam Vitals reviewed.  Constitutional:      Appearance: Normal appearance. She is well-developed.  HENT:     Head: Normocephalic and atraumatic.  Eyes:     Conjunctiva/sclera: Conjunctivae normal.     Pupils: Pupils are equal, round, and reactive to light.  Neck:     Vascular: No carotid bruit.  Cardiovascular:     Rate and Rhythm: Normal rate and regular rhythm.     Heart sounds: Normal heart sounds.  Pulmonary:     Effort: Pulmonary effort is normal.     Breath sounds: Normal breath sounds.  Abdominal:     Palpations: Abdomen is soft. There is no pulsatile mass.     Tenderness: There is no abdominal tenderness.  Musculoskeletal:     Right lower leg: Edema (trace, nonpitting.) present.      Left lower leg: Edema present.  Skin:    General: Skin is warm and dry.     Comments: R lower leg - small papule, vesicle. See photo. No significant erythema, induration.   Neurological:     Mental Status: She is alert and oriented to person, place, and time.  Psychiatric:        Mood and Affect: Mood normal.        Behavior: Behavior normal.          Assessment & Plan:  Mackenzie Everett is a 61 y.o. female . Essential hypertension - Plan: metoprolol succinate (TOPROL-XL) 25 MG 24 hr tablet, losartan-hydrochlorothiazide (HYZAAR) 50-12.5 MG tablet, Comprehensive metabolic panel  - Stable, tolerating current regimen. Medications refilled. Labs pending as above.   History of palpitations  - improved, reassuring work-up as above, continue follow-up with cardiology with RTC/ER precautions if acute changes.  OSA (obstructive sleep apnea)  -Mild, plan for weight loss, triangle pillow to adjust sleep position.  Hyperlipidemia, unspecified hyperlipidemia type - Plan: Lipid panel  -Repeat labs, borderline ASCVD risk were previously.  If A1c is at diabetes level would recommend statin or at least intermittent statin.  Med changes to be determined by results.  Prediabetes - Plan: Hemoglobin A1c With obesity.  Check updated A1c.  Plans increased activity for weight loss, dietary advice given, consider Wegovy (potential risks, side effects, potential long-term use need discussed).  Would like to see lab results first.  Rash and nonspecific skin eruption - Plan: triamcinolone cream (KENALOG) 0.1 %, Ambulatory referral to Dermatology  -Few papules, vesicles, overall reassuring exam.  Triamcinolone as needed intermittently with dermatology eval ordered.  RTC precautions if worse.  Meds ordered this encounter  Medications   metoprolol succinate (TOPROL-XL) 25 MG 24 hr tablet    Sig: Take 1 tablet (25 mg total) by mouth at bedtime.    Dispense:  90 tablet    Refill:  2    losartan-hydrochlorothiazide (HYZAAR) 50-12.5 MG tablet    Sig: Take 1 tablet by mouth daily.    Dispense:  90 tablet    Refill:  2    DX Code Needed  .  triamcinolone cream (KENALOG) 0.1 %    Sig: Apply 1 Application topically 2 (two) times daily.    Dispense:  30 g    Refill:  0   Patient Instructions  I will check labs. Depending on results can discuss med like [facility] for weight loss with close follow up. If other concerns I will let you know. No med changes for now. Thanks for coming in today.   Aveeno lotion ok for leg rash and steroid cream twice per day for itching  if needed until seen by dermatology. Return to the clinic or go to the nearest emergency room if any of your symptoms worsen or new symptoms occur.       Signed,   Meredith Staggers, MD [facility], [facility] 10/29/21 10:27 AM

## 2021-10-29 NOTE — Patient Instructions (Addendum)
I will check labs. Depending on results can discuss med like [facility] for weight loss with close follow up. If other concerns I will let you know. No med changes for now. Thanks for coming in today.   Aveeno lotion ok for leg rash and steroid cream twice per day for itching  if needed until seen by dermatology. Return to the clinic or go to the nearest emergency room if any of your symptoms worsen or new symptoms occur.

## 2021-10-30 LAB — COMPREHENSIVE METABOLIC PANEL
ALT: 13 U/L (ref 0–35)
AST: 14 U/L (ref 0–37)
Albumin: 4.5 g/dL (ref 3.5–5.2)
Alkaline Phosphatase: 76 U/L (ref 39–117)
BUN: 16 mg/dL (ref 6–23)
CO2: 27 mEq/L (ref 19–32)
Calcium: 10.1 mg/dL (ref 8.4–10.5)
Chloride: 104 mEq/L (ref 96–112)
Creatinine, Ser: 0.99 mg/dL (ref 0.40–1.20)
GFR: 60.77 mL/min (ref >60.00)
Glucose, Bld: 91 mg/dL (ref 70–99)
Potassium: 4.3 mEq/L (ref 3.5–5.1)
Sodium: 138 mEq/L (ref 135–145)
Total Bilirubin: 0.3 mg/dL (ref 0.2–1.2)
Total Protein: 8.1 g/dL (ref 6.0–8.3)

## 2021-10-30 LAB — LIPID PANEL
Cholesterol: 253 mg/dL — ABNORMAL HIGH (ref 0–200)
HDL: 52.2 mg/dL (ref >39.00)
LDL Cholesterol: 174 mg/dL — ABNORMAL HIGH (ref 0–99)
NonHDL: 200.33
Total CHOL/HDL Ratio: 5
Triglycerides: 131 mg/dL (ref 0.0–149.0)
VLDL: 26.2 mg/dL (ref 0.0–40.0)

## 2021-11-06 ENCOUNTER — Other Ambulatory Visit: Payer: Self-pay

## 2021-11-06 ENCOUNTER — Telehealth: Payer: Self-pay

## 2021-11-06 DIAGNOSIS — I10 Essential (primary) hypertension: Secondary | ICD-10-CM

## 2021-11-06 DIAGNOSIS — E785 Hyperlipidemia, unspecified: Secondary | ICD-10-CM

## 2021-11-06 NOTE — Telephone Encounter (Signed)
Patient called in to let you know she is okay with starting the Cholesterol medication and wanted some information on the other medication you discussed. Patient states she thinks it was wegovy but she is not 100% sure

## 2021-11-08 MED ORDER — WEGOVY 0.25 MG/0.5ML ~~LOC~~ SOAJ: 0.2500 mg | SUBCUTANEOUS | 0 refills | Status: DC

## 2021-11-08 MED ORDER — ATORVASTATIN CALCIUM 10 MG PO TABS: 10.0000 mg | ORAL_TABLET | Freq: Every day | ORAL | 0 refills | Status: DC

## 2021-11-08 NOTE — Addendum Note (Signed)
Addended by: Meredith Staggers R on: 11/08/2021 06:37 PM   Modules accepted: Orders

## 2021-11-08 NOTE — Telephone Encounter (Signed)
Called patient.  Will start Lipitor, initially 1 to 2 days/week, increase up to daily as tolerated, potential side effects discussed.  [facility] discussed.  Will start low-dose, follow-up for virtual visit in 1 month with home weight monitoring, potential side effects and risks were discussed including nausea and meal adjustments if needed.  Understanding expressed, all questions answered.

## 2021-12-20 ENCOUNTER — Ambulatory Visit: Payer: BC Managed Care – PPO | Admitting: Family Medicine

## 2021-12-20 ENCOUNTER — Encounter: Payer: Self-pay | Admitting: Family Medicine

## 2021-12-20 VITALS — BP 118/82 | HR 72 | Temp 98.1°F | Ht 70.0 in | Wt 333.0 lb

## 2021-12-20 DIAGNOSIS — M79674 Pain in right toe(s): Secondary | ICD-10-CM

## 2021-12-20 DIAGNOSIS — L03031 Cellulitis of right toe: Secondary | ICD-10-CM | POA: Diagnosis not present

## 2021-12-20 DIAGNOSIS — Z23 Encounter for immunization: Secondary | ICD-10-CM

## 2021-12-20 MED ORDER — DOXYCYCLINE HYCLATE 100 MG PO TABS: 100.0000 mg | ORAL_TABLET | Freq: Two times a day (BID) | ORAL | 0 refills | Status: DC

## 2021-12-20 NOTE — Progress Notes (Signed)
Subjective:  Patient ID: Mackenzie Everett, female    DOB: [birth year]  Age: 61 y.o. MRN: [id number]  CC:  Chief Complaint  Patient presents with   Right Big toe     Hurting for 3 weeks, pt states she cant put a shoe on because it hurts and very tender, pt states she has soaked it in Epson salt and it hasnt been working    HPI Mackenzie Everett presents for   Right great toe pain Past 3 weeks. No known injury but some soreness after last pedicure about the same time. No redness initially. More soreness as weeks have progressed and some redness. Sore to touch. No discharge. Some swelling on inside of skin by nail.  Tx: Espom soaks BID.  No fever/chills.  No known history of gout.  Uric acid 6.9 in 2018.  History Patient Active Problem List   Diagnosis Date Noted   OSA (obstructive sleep apnea) 07/18/2021   Paroxysmal A-fib ([place]) 07/18/2021   Hypertension 06/24/2020   Pre-op evaluation 06/22/2020   Hepatitis C 01/19/2016   Obesity, Class III, BMI 40-49.9 (morbid obesity) ([place]) 09/04/2011   Hyperlipidemia 09/04/2011   Migraine variant 09/04/2011   Asthma with allergic rhinitis 09/04/2011   Degenerative disc disease, lumbar 09/04/2011   Past Medical History:  Diagnosis Date   Allergy    Arthritis    hands and knees   Asthma    exercise induced   Hyperlipidemia 09/04/2011   Hypertension    Past Surgical History:  Procedure Laterality Date   APPENDECTOMY  1998   ARTERY BIOPSY Left 01/31/2021   Procedure: LEFT TEMPORAL ARTERY BIOPSY;  Surgeon: Coralie Keens, MD;  Location: [facility];  Service: General;  Laterality: Left;   BREAST CYST EXCISION Right 1980   KNEE ARTHROSCOPY Right 06/2020   KNEE SURGERY Left 1994   TUBAL LIGATION  1992   Allergies  Allergen Reactions   Caffeine Other (See Comments)    migraines   Penicillins Hives    Tolerated 3 grams of ancef 01/31/2021   Prior to Admission medications   Medication Sig Start Date End Date Taking? Authorizing Provider   atorvastatin (LIPITOR) 10 MG tablet Take 1 tablet (10 mg total) by mouth daily. 11/08/21   Wendie Agreste, MD  fluticasone (FLONASE) 50 MCG/ACT nasal spray Place 2 sprays into both nostrils daily as needed for allergies or rhinitis.    [provider]  losartan-hydrochlorothiazide (HYZAAR) 50-12.5 MG tablet Take 1 tablet by mouth daily. 10/29/21   Wendie Agreste, MD  metoprolol succinate (TOPROL-XL) 25 MG 24 hr tablet Take 1 tablet (25 mg total) by mouth at bedtime. 10/29/21   Wendie Agreste, MD  naratriptan (AMERGE) 2.5 MG tablet Take 2.5 mg by mouth as needed for migraine. Take one (1) tablet at onset of headache; if returns or does not resolve, may repeat after 4 hours; do not exceed five (5) mg in 24 hours.    [provider]  Semaglutide-Weight Management (WEGOVY) 0.25 MG/0.5ML SOAJ Inject 0.25 mg into the skin once a week. 11/08/21   Wendie Agreste, MD  traMADol (ULTRAM) 50 MG tablet Take 1 tablet (50 mg total) by mouth every 6 (six) hours as needed for moderate pain or severe pain. 01/31/21   Coralie Keens, MD  triamcinolone cream (KENALOG) 0.1 % Apply 1 Application topically 2 (two) times daily. 10/29/21   Wendie Agreste, MD  zonisamide (ZONEGRAN) 100 MG capsule Take 100 mg by mouth daily. 07/03/21  [provider]   Social History   Socioeconomic History   Marital status: Single    Spouse name: Not on file   Number of children: 4   Years of education: Not on file   Highest education level: Not on file  Occupational History   Not on file  Tobacco Use   Smoking status: Never   Smokeless tobacco: Never  Vaping Use   Vaping Use: Never used  Substance and Sexual Activity   Alcohol use: Yes    Comment: social   Drug use: No   Sexual activity: Yes    Birth control/protection: Post-menopausal  Other Topics Concern   Not on file  Social History Narrative   Lives at home alone.   Social Determinants of Health   Financial Resource Strain:  Not on file  Food Insecurity: Not on file  Transportation Needs: Not on file  Physical Activity: Not on file  Stress: Not on file  Social Connections: Not on file  Intimate Partner Violence: Not on file    Review of Systems Per HPI.  Objective:   Vitals:   12/20/21 0938  BP: 118/82  Pulse: 72  Temp: 98.1 F (36.7 C)  SpO2: 98%  Weight: (!) 333 lb (151 kg)  Height: 5\' 10"  (1.778 m)     Physical Exam Constitutional:      General: She is not in acute distress.    Appearance: Normal appearance. She is well-developed.  HENT:     Head: Normocephalic and atraumatic.  Cardiovascular:     Rate and Rhythm: Normal rate.  Pulmonary:     Effort: Pulmonary effort is normal.  Skin:    Comments: Right great toenail with induration, erythema of medial nail fold.  No exudate expressed.  No proximal erythema.   No bony tenderness.  Neurological:     Mental Status: She is alert and oriented to person, place, and time.  Psychiatric:        Mood and Affect: Mood normal.         Assessment & Plan:  Mackenzie Everett is a 61 y.o. female . Pain of right great toe  Need for influenza vaccination - Plan: Flu Vaccine QUAD 6+ mos PF IM (Fluarix Quad PF)  Paronychia of great toe of right foot - Plan: doxycycline (VIBRA-TABS) 100 MG tablet  Possible early paronychia.  Differential also includes ingrown toenail but timing of pedicure suspicious for paronychia.  Start doxycycline as penicillin allergic, soaks, follow-up with podiatrist next week if not improving to evaluate for possible ingrown toenail.  Urgent care precautions discussed over the weekend.  Handout given.  Meds ordered this encounter  Medications   doxycycline (VIBRA-TABS) 100 MG tablet    Sig: Take 1 tablet (100 mg total) by mouth 2 (two) times daily.    Dispense:  14 tablet    Refill:  0   Patient Instructions  Okay to continue soaks, and if you notice any pus formation, gentle expression of the pus is fine for now.   Start antibiotic for possible early infection but if that is not improving into early next week, I do recommend evaluation with your podiatrist to make sure there is not an ingrown nail.  Urgent care over the weekend if any worsening.  Paronychia Paronychia is an infection of the skin that surrounds a nail. It usually affects the skin around a fingernail, but it may also occur near a toenail. It often causes pain and swelling around the nail. In  some cases, a collection of pus (abscess) can form near or under the nail.  This condition may develop suddenly, or it may develop gradually over a longer period. In most cases, paronychia is not serious, and it will clear up with treatment. What are the causes? This condition may be caused by bacteria or a fungus, such as yeast. The bacteria or fungus can enter the body through an opening in the skin, such as a cut or a hangnail, and cause an infection in your fingernail or toenail. Other causes may include: Recurrent injury to the fingernail or toenail area. Irritation of the base and sides of the nail (cuticle). Injury and irritation can result in inflammation, swelling, and thickened skin around the nail. What increases the risk? This condition is more likely to develop in people who: Get their hands wet often, such as those who work as Fish farm manager, bartenders, or housekeepers. Bite their fingernails or cuticles. Have underlying skin conditions. Have hangnails or injured fingertips. Are exposed to irritants like detergents and other chemicals. Have diabetes. What are the signs or symptoms? Symptoms of this condition include: Redness and swelling of the skin near the nail. Tenderness around the nail when you touch the area. Pus-filled bumps under the cuticle. Fluid or pus under the nail. Throbbing pain in the area. How is this diagnosed? This condition is diagnosed with a physical exam. In some cases, a sample of pus may be tested to determine  what type of bacteria or fungus is causing the condition. How is this treated? Treatment depends on the cause and severity of your condition. If your condition is mild, it may clear up on its own in a few days or after soaking in warm water. If needed, treatment may include: Antibiotic medicine, if your infection is caused by bacteria. Antifungal medicine, if your infection is caused by a fungus. A procedure to drain pus from an abscess. Anti-inflammatory medicine (corticosteroids). Removal of part of an ingrown toenail. A bandage (dressing) may be placed over the affected area if an abscess or part of a nail has been removed. Follow these instructions at home: Wound care Keep the affected area clean. Soak the affected area in warm water if told to do so by your health care provider. You may be told to do this for 20 minutes, 2-3 times a day. Keep the area dry when you are not soaking it. Do not try to drain an abscess yourself. Follow instructions from your health care provider about how to take care of the affected area. Make sure you: Wash your hands with soap and water for at least 20 seconds before and after you change your dressing. If soap and water are not available, use hand sanitizer. Change your dressing as told by your health care provider. If you had an abscess drained, check the area every day for signs of infection. Check for: Redness, swelling, or pain. Fluid or blood. Warmth. Pus or a bad smell. Medicines  Take over-the-counter and prescription medicines only as told by your health care provider. If you were prescribed an antibiotic medicine, take it as told by your health care provider. Do not stop taking the antibiotic even if you start to feel better. General instructions Avoid contact with any skin irritants or allergens. Do not pick at the affected area. Keep all follow-up visits as told. This is important. Prevention To prevent this condition from happening  again: Wear rubber gloves when washing dishes or doing other tasks that require your  hands to get wet. Wear gloves if your hands might come in contact with cleaners or other chemicals. Avoid injuring your nails or fingertips. Do not bite your nails or tear hangnails. Do not cut your nails very short. Do not cut your cuticles. Use clean nail clippers or scissors when trimming nails. Contact a health care provider if: Your symptoms get worse or do not improve with treatment. You have continued or increased fluid, blood, or pus coming from the affected area. Your affected finger, toe, or joint becomes swollen or difficult to move. You have a fever or chills. There is redness spreading away from the affected area. Summary Paronychia is an infection of the skin that surrounds a nail. It often causes pain and swelling around the nail. In some cases, a collection of pus (abscess) can form near or under the nail. This condition may be caused by bacteria or a fungus. These germs can enter the body through an opening in the skin, such as a cut or a hangnail. If your condition is mild, it may clear up on its own in a few days. If needed, treatment may include medicine or a procedure to drain pus from an abscess. To prevent this condition from happening again, wear gloves if doing tasks that require your hands to get wet or to come in contact with chemicals. Also avoid injuring your nails or fingertips. This information is not intended to replace advice given to you by your health care provider. Make sure you discuss any questions you have with your health care provider. Document Revised: 06/19/2020 Document Reviewed: 06/19/2020 Elsevier Patient Education  2023 [employer]     Signed,   Meredith StaggersJeffrey Marquez Ceesay, MD [facility], [facility] 12/20/21 10:03 AM

## 2021-12-20 NOTE — Patient Instructions (Signed)
Okay to continue soaks, and if you notice any pus formation, gentle expression of the pus is fine for now.  Start antibiotic for possible early infection but if that is not improving into early next week, I do recommend evaluation with your podiatrist to make sure there is not an ingrown nail.  Urgent care over the weekend if any worsening.  Paronychia Paronychia is an infection of the skin that surrounds a nail. It usually affects the skin around a fingernail, but it may also occur near a toenail. It often causes pain and swelling around the nail. In some cases, a collection of pus (abscess) can form near or under the nail.  This condition may develop suddenly, or it may develop gradually over a longer period. In most cases, paronychia is not serious, and it will clear up with treatment. What are the causes? This condition may be caused by bacteria or a fungus, such as yeast. The bacteria or fungus can enter the body through an opening in the skin, such as a cut or a hangnail, and cause an infection in your fingernail or toenail. Other causes may include: Recurrent injury to the fingernail or toenail area. Irritation of the base and sides of the nail (cuticle). Injury and irritation can result in inflammation, swelling, and thickened skin around the nail. What increases the risk? This condition is more likely to develop in people who: Get their hands wet often, such as those who work as Designer, industrial/product, bartenders, or housekeepers. Bite their fingernails or cuticles. Have underlying skin conditions. Have hangnails or injured fingertips. Are exposed to irritants like detergents and other chemicals. Have diabetes. What are the signs or symptoms? Symptoms of this condition include: Redness and swelling of the skin near the nail. Tenderness around the nail when you touch the area. Pus-filled bumps under the cuticle. Fluid or pus under the nail. Throbbing pain in the area. How is this  diagnosed? This condition is diagnosed with a physical exam. In some cases, a sample of pus may be tested to determine what type of bacteria or fungus is causing the condition. How is this treated? Treatment depends on the cause and severity of your condition. If your condition is mild, it may clear up on its own in a few days or after soaking in warm water. If needed, treatment may include: Antibiotic medicine, if your infection is caused by bacteria. Antifungal medicine, if your infection is caused by a fungus. A procedure to drain pus from an abscess. Anti-inflammatory medicine (corticosteroids). Removal of part of an ingrown toenail. A bandage (dressing) may be placed over the affected area if an abscess or part of a nail has been removed. Follow these instructions at home: Wound care Keep the affected area clean. Soak the affected area in warm water if told to do so by your health care provider. You may be told to do this for 20 minutes, 2-3 times a day. Keep the area dry when you are not soaking it. Do not try to drain an abscess yourself. Follow instructions from your health care provider about how to take care of the affected area. Make sure you: Wash your hands with soap and water for at least 20 seconds before and after you change your dressing. If soap and water are not available, use hand sanitizer. Change your dressing as told by your health care provider. If you had an abscess drained, check the area every day for signs of infection. Check for: Redness, swelling, or pain.  Fluid or blood. Warmth. Pus or a bad smell. Medicines  Take over-the-counter and prescription medicines only as told by your health care provider. If you were prescribed an antibiotic medicine, take it as told by your health care provider. Do not stop taking the antibiotic even if you start to feel better. General instructions Avoid contact with any skin irritants or allergens. Do not pick at the affected  area. Keep all follow-up visits as told. This is important. Prevention To prevent this condition from happening again: Wear rubber gloves when washing dishes or doing other tasks that require your hands to get wet. Wear gloves if your hands might come in contact with cleaners or other chemicals. Avoid injuring your nails or fingertips. Do not bite your nails or tear hangnails. Do not cut your nails very short. Do not cut your cuticles. Use clean nail clippers or scissors when trimming nails. Contact a health care provider if: Your symptoms get worse or do not improve with treatment. You have continued or increased fluid, blood, or pus coming from the affected area. Your affected finger, toe, or joint becomes swollen or difficult to move. You have a fever or chills. There is redness spreading away from the affected area. Summary Paronychia is an infection of the skin that surrounds a nail. It often causes pain and swelling around the nail. In some cases, a collection of pus (abscess) can form near or under the nail. This condition may be caused by bacteria or a fungus. These germs can enter the body through an opening in the skin, such as a cut or a hangnail. If your condition is mild, it may clear up on its own in a few days. If needed, treatment may include medicine or a procedure to drain pus from an abscess. To prevent this condition from happening again, wear gloves if doing tasks that require your hands to get wet or to come in contact with chemicals. Also avoid injuring your nails or fingertips. This information is not intended to replace advice given to you by your health care provider. Make sure you discuss any questions you have with your health care provider. Document Revised: 06/19/2020 Document Reviewed: 06/19/2020 Elsevier Patient Education  2023 [employer].

## 2022-01-27 ENCOUNTER — Ambulatory Visit
Admission: EM | Admit: 2022-01-27 | Discharge: 2022-01-27 | Disposition: A | Discharge status: Home / Self Care | Payer: BC Managed Care – PPO | Attending: Urgent Care | Admitting: Urgent Care

## 2022-01-27 DIAGNOSIS — L03114 Cellulitis of left upper limb: Secondary | ICD-10-CM | POA: Diagnosis not present

## 2022-01-27 DIAGNOSIS — S50862A Insect bite (nonvenomous) of left forearm, initial encounter: Secondary | ICD-10-CM

## 2022-01-27 DIAGNOSIS — W57XXXA Bitten or stung by nonvenomous insect and other nonvenomous arthropods, initial encounter: Secondary | ICD-10-CM | POA: Diagnosis not present

## 2022-01-27 MED ORDER — PREDNISONE 20 MG PO TABS: ORAL_TABLET | ORAL | 0 refills | Status: AC

## 2022-01-27 MED ORDER — DOXYCYCLINE HYCLATE 100 MG PO CAPS: 100.0000 mg | ORAL_CAPSULE | Freq: Two times a day (BID) | ORAL | 0 refills | Status: AC

## 2022-01-27 NOTE — ED Provider Notes (Signed)
[facility]  Note:  This document was prepared using Conservation officer, historic buildings and may include unintentional dictation errors.  MRN: [id number] DOB: [birth year]  Subjective:   Mackenzie Everett is a 61 y.o. female presenting for 2-day history of acute onset multiple bites to the left forearm.  They were initially itchy but are now becoming painful, red and swollen.  Cannot recall what bit her or stung her.  Tdap is updated, last dose 2016.  No current facility-administered medications for this encounter.  Current Outpatient Medications:    atorvastatin (LIPITOR) 10 MG tablet, Take 1 tablet (10 mg total) by mouth daily., Disp: 90 tablet, Rfl: 0   fluticasone (FLONASE) 50 MCG/ACT nasal spray, Place 2 sprays into both nostrils daily as needed for allergies or rhinitis., Disp: , Rfl:    losartan-hydrochlorothiazide (HYZAAR) 50-12.5 MG tablet, Take 1 tablet by mouth daily., Disp: 90 tablet, Rfl: 2   metoprolol succinate (TOPROL-XL) 25 MG 24 hr tablet, Take 1 tablet (25 mg total) by mouth at bedtime., Disp: 90 tablet, Rfl: 2   naratriptan (AMERGE) 2.5 MG tablet, Take 2.5 mg by mouth as needed for migraine. Take one (1) tablet at onset of headache; if returns or does not resolve, may repeat after 4 hours; do not exceed five (5) mg in 24 hours., Disp: , Rfl:    traMADol (ULTRAM) 50 MG tablet, Take 1 tablet (50 mg total) by mouth every 6 (six) hours as needed for moderate pain or severe pain., Disp: 20 tablet, Rfl: 0   triamcinolone cream (KENALOG) 0.1 %, Apply 1 Application topically 2 (two) times daily., Disp: 30 g, Rfl: 0   zonisamide (ZONEGRAN) 100 MG capsule, Take 100 mg by mouth daily., Disp: , Rfl:    doxycycline (VIBRA-TABS) 100 MG tablet, Take 1 tablet (100 mg total) by mouth 2 (two) times daily., Disp: 14 tablet, Rfl: 0   Semaglutide-Weight Management (WEGOVY) 0.25 MG/0.5ML SOAJ, Inject 0.25 mg into the skin once a week. (Patient not taking: Reported on 12/20/2021),  Disp: 2 mL, Rfl: 0   Allergies  Allergen Reactions   Caffeine Other (See Comments)    migraines   Penicillins Hives    Tolerated 3 grams of ancef 01/31/2021    Past Medical History:  Diagnosis Date   Allergy    Arthritis    hands and knees   Asthma    exercise induced   Hyperlipidemia 09/04/2011   Hypertension      Past Surgical History:  Procedure Laterality Date   APPENDECTOMY  1998   ARTERY BIOPSY Left 01/31/2021   Procedure: LEFT TEMPORAL ARTERY BIOPSY;  Surgeon: Abigail Miyamoto, MD;  Location: [facility];  Service: General;  Laterality: Left;   BREAST CYST EXCISION Right 1980   KNEE ARTHROSCOPY Right 06/2020   KNEE SURGERY Left 1994   TUBAL LIGATION  1992    Family History  Problem Relation Age of Onset   Arthritis Mother    Diabetes Mother    Hyperlipidemia Mother    Hypertension Mother    Hyperlipidemia Sister    Hypertension Sister    Lupus Sister    Diabetes Maternal Grandmother    Heart disease Maternal Grandfather    Heart disease Paternal Grandfather    Cancer Maternal Aunt     Social History   Tobacco Use   Smoking status: Never   Smokeless tobacco: Never  Vaping Use   Vaping Use: Never used  Substance Use Topics   Alcohol use: Yes  Comment: social   Drug use: No    ROS   Objective:   Vitals: BP 116/75 (BP Location: Right Arm)   Pulse 90   Temp 98.6 F (37 C) (Oral)   Resp 18   Ht 5\' 10"  (1.778 m)   Wt (!) 323 lb (146.5 kg)   SpO2 96%   BMI 46.35 kg/m   Physical Exam Constitutional:      General: She is not in acute distress.    Appearance: Normal appearance. She is well-developed. She is not ill-appearing, toxic-appearing or diaphoretic.  HENT:     Head: Normocephalic and atraumatic.     Nose: Nose normal.     Mouth/Throat:     Mouth: Mucous membranes are moist.  Eyes:     General: No scleral icterus.       Right eye: No discharge.        Left eye: No discharge.     Extraocular Movements: Extraocular movements  intact.  Cardiovascular:     Rate and Rhythm: Normal rate.  Pulmonary:     Effort: Pulmonary effort is normal.  Musculoskeletal:     Comments: Multiple puncture wounds to the left forearm with surrounding erythema, swelling and tenderness.  There is also warmth.  Refer to images.  Skin:    General: Skin is warm and dry.  Neurological:     General: No focal deficit present.     Mental Status: She is alert and oriented to person, place, and time.  Psychiatric:        Mood and Affect: Mood normal.        Behavior: Behavior normal.            Assessment and Plan :   PDMP not reviewed this encounter.  1. Cellulitis of left upper extremity   2. Insect bite of left forearm, initial encounter    Will cover for infectious process including cellulitis using doxycycline.  Believe this is secondary to an irritant dermatitis from an unknown bug bite/stings.  We will use a prednisone course for this given worsening nature of her symptoms. Counseled patient on potential for adverse effects with medications prescribed/recommended today, ER and return-to-clinic precautions discussed, patient verbalized understanding.      Jaynee Eagles, [place] 01/30/22 [phone]

## 2022-01-27 NOTE — ED Triage Notes (Signed)
Pt states that's she was bit by an insect on left arm. X2 days  Pt states that she has some redness, swelling and itching of left arm.

## 2022-01-30 ENCOUNTER — Ambulatory Visit: Payer: BC Managed Care – PPO | Admitting: Family Medicine

## 2022-02-01 ENCOUNTER — Ambulatory Visit (INDEPENDENT_AMBULATORY_CARE_PROVIDER_SITE_OTHER): Payer: BC Managed Care – PPO | Admitting: Family Medicine

## 2022-02-01 ENCOUNTER — Encounter: Payer: Self-pay | Admitting: Family Medicine

## 2022-02-01 VITALS — BP 124/70 | HR 52 | Temp 98.9°F | Resp 19 | Ht 70.0 in | Wt 331.1 lb

## 2022-02-01 DIAGNOSIS — L03114 Cellulitis of left upper limb: Secondary | ICD-10-CM | POA: Diagnosis not present

## 2022-02-01 NOTE — Patient Instructions (Signed)
Follow up as needed or as scheduled Finish the Doxycycline as directed- take w/ food Finish the Prednisone as directed to help w/ itching and swelling ADD the Triamcinolone cream twice daily to help w/ itching and swelling ICE or cool compresses will help w/ itching and swelling Call with any questions or concerns Have a great weekend!!!

## 2022-02-01 NOTE — Progress Notes (Signed)
   Subjective:    Patient ID: Mackenzie Everett, female    DOB: [birth year], 61 y.o.   MRN: [id number]  HPI Cellulitis- pt was seen at [facility] on 10/29 and d/x'd w/ cellulitis of L forearm after having multiple bug bites.  Treated w/ Doxycycline and Prednisone due to itching and swelling.  Area remains itchy but she feels the swelling is improving.   Review of Systems For ROS see HPI     Objective:   Physical Exam Vitals reviewed.  Constitutional:      General: She is not in acute distress.    Appearance: Normal appearance. She is not ill-appearing.  Skin:    General: Skin is warm and dry.     Findings: No erythema.     Comments: Insect bite sites are scabbed and healing, no surrounding redness at this time  Neurological:     General: No focal deficit present.     Mental Status: She is alert and oriented to person, place, and time.  Psychiatric:        Mood and Affect: Mood normal.        Behavior: Behavior normal.        Thought Content: Thought content normal.           Assessment & Plan:   Cellulitis- new.  Pt's L arm is improving since starting the Doxy and Prednisone.  Encouraged her to finish both.  Will add triamcinolone ointment for additional itch relief.  Pt expressed understanding and is in agreement w/ plan.

## 2022-02-06 ENCOUNTER — Encounter: Payer: Self-pay | Admitting: Family Medicine

## 2022-02-06 ENCOUNTER — Ambulatory Visit (INDEPENDENT_AMBULATORY_CARE_PROVIDER_SITE_OTHER): Payer: BC Managed Care – PPO | Admitting: Family Medicine

## 2022-02-06 VITALS — BP 138/58 | HR 94 | Temp 97.6°F | Ht 70.0 in | Wt 332.0 lb

## 2022-02-06 DIAGNOSIS — R002 Palpitations: Secondary | ICD-10-CM | POA: Diagnosis not present

## 2022-02-06 DIAGNOSIS — I10 Essential (primary) hypertension: Secondary | ICD-10-CM

## 2022-02-06 DIAGNOSIS — M7752 Other enthesopathy of left foot: Secondary | ICD-10-CM

## 2022-02-06 NOTE — Patient Instructions (Addendum)
Please call cardiology for follow-up and advise them that you are having more frequent palpitations.  I will check some labs today.  If any worsening symptoms, chest pain, worsening shortness of breath be seen in the emergency room.  Based on what I heard on your exam today I am suspicious that you may be having supraventricular tachycardia.  That was seen on your previous heart monitor.  Pain in the left heel appears to be retrocalcaneal bursitis or inflammation/pain at the fluid-filled sac behind the heel bone.  This can sometimes be associated with something called a Haglund's deformity or extra bone in that area.  Try to avoid footwear that presses on the back of the heel.  Calf stretches throughout the day can be helpful, ice over area if needed for short-term, 10 minutes at a time.  Topical diclofenac okay temporarily.  Please recheck in 2 weeks as we may need to check x-ray at that time if persistent pain.  Sooner if worse.  Supraventricular Tachycardia, Adult Supraventricular tachycardia (SVT) is a type of abnormal heart rhythm. It causes the heart to beat very quickly. SVT can start suddenly and last for a short time, which is called paroxysmal SVT, or it may last longer and require specialized treatment to return the heart rhythm to normal. A normal resting heart rate is 60-100 beats per minute. During an episode of SVT, your heart rate may be higher than 150 beats per minute. Episodes of SVT can be frightening, but they are usually not dangerous. However, if episodes happen several times a day or last longer than a few seconds, they may lead to heart failure. What are the causes?  Usually, a normal heartbeat starts when an area called the sinoatrial node releases an electrical signal. In SVT, other areas of the heart send out electrical signals that interfere with the signal from the sinoatrial node. The cause of this abnormal electrical activity is not known. What increases the risk? You are  more likely to develop this condition if you are: Middle aged or younger. Female. The following factors may also make you more likely to develop this condition: Stress or anxiety. Tiredness. Smoking. Stimulant drugs, such as cocaine and methamphetamine. Alcohol. Caffeine. Pregnancy. Having any of these conditions: A thyroid condition. Diabetes mellitus. Obstructive sleep apnea. What are the signs or symptoms? Symptoms of this condition include: A pounding heart. A feeling that the heart is skipping beats (palpitations). Weakness. Shortness of breath. Tightness or pain in your chest. Light-headedness or dizziness. Anxiety. Sweating. Nausea. Fainting. Fatigue or tiredness. A mild episode may not cause symptoms. How is this diagnosed? This condition may be diagnosed based on: Your symptoms. A physical exam. If you have an episode of SVT during the exam, the health care provider may be able to diagnose SVT by listening to your heart and feeling your pulse. Tests. These may include: An electrocardiogram (ECG). This test is done to check for problems with electrical activity in the heart. A Holter monitor or event monitor test. This test involves wearing a portable device that monitors your heart rate over time. An echocardiogram. This test involves taking an image of your heart using sound waves. It is done to rule out other causes of a fast heart rate. A stress echocardiogram. This test involves doing an echocardiogram when you are at rest and after exercise. Blood tests. An electrophysiology study (EPS). This tests the electrical activity in your heart to find where the abnormal heart rhythm is coming from using  cardiac catheters. How is this treated? This condition may be treated with: Vagal nerve stimulation. This involves stimulating your vagus nerve, which is a nerve that runs from the chest, through the neck, to the lower part of the brain. Stimulating this nerve can slow  down the heart. It is often the first and only treatment that is needed for this condition. Work with your health care provider to find which technique works best for you. Ways to do this treatment include: Laying on your back, then holding your breath and pushing, as though you are having a bowel movement. Massaging an area on one side of your neck, below your jaw. Do not try this yourself. Only a health care provider should do this. If done the wrong way, it can lead to a stroke. Bending forward with your head between your legs. Coughing while bending forward with your head between your legs. Applying an ice-cold, wet towel to your face. Medicines that prevent attacks. Medicine to stop an attack. The medicine is given through an IV at the hospital. A small electric shock (cardioversion) that stops an attack. Before you get the shock, you will get medicine to make you fall asleep. Radiofrequency ablation. In this procedure, a small, thin tube (catheter) is used to send radiofrequency energy to the area of tissue that is causing the rapid heartbeats. The energy kills the cells and helps your heart keep a normal rhythm. You may have this treatment if you have symptoms of SVT often. If you do not have symptoms, you may not need treatment. Follow these instructions at home: Stress Avoid stressful situations when possible. Find healthy ways of managing stress, such as: Taking part in relaxing activities, such as yoga, meditation, or being out in nature. Listening to relaxing music. Practicing relaxation techniques, such as deep breathing. Leading a healthy lifestyle. This involves getting plenty of sleep, exercising, and eating a balanced diet. Attending counseling or talk therapy with a mental health professional. Lifestyle  Try to get at least 7 hours of sleep each night. Do not use any products that contain nicotine or tobacco. These products include cigarettes, chewing tobacco, and vaping  devices, such as e-cigarettes. If you need help quitting, ask your health care provider. Do not drink alcohol if it triggers episodes of SVT. If alcohol does not seem to trigger episodes, limit your alcohol intake. If you drink alcohol: Limit how much you have to: 0-1 drink a day for women who are not pregnant. 0-2 drinks a day for men. Know how much alcohol is in your drink. In the [place] one drink equals one 12 oz bottle of beer (355 mL), one 5 oz glass of wine (148 mL), or one 1 oz glass of hard liquor (44 mL). Be aware of how caffeine affects your condition. If caffeine: Triggers episodes of SVT, do not eat, drink, or use anything with caffeine in it. Does not seem to trigger episodes, consume caffeine in moderation. Do not use stimulant drugs. If you need help quitting, talk with your health care provider. General instructions Maintain a healthy weight. Exercise regularly. Ask your health care provider to suggest some good activities for you. Aim for one or a combination of the following: 150 minutes per week of moderate exercise, such as walking or yoga. 75 minutes per week of vigorous exercise, such as running or swimming. Perform vagus nerve stimulation as directed by your health care provider. Take over-the-counter and prescription medicines only as told by your health care provider.  Keep all follow-up visits. This is important. Contact a health care provider if: You have episodes of SVT more often than before. Episodes of SVT last longer than before. Vagus nerve stimulation is no longer helping. You have new symptoms. Get help right away if: You have chest pain. Your symptoms get worse. You have trouble breathing. You have an episode of SVT that lasts longer than 20 minutes. You faint. These symptoms may represent a serious problem that is an emergency. Do not wait to see if the symptoms will go away. Get medical help right away. Call your local emergency services (911 in the  [place] Do not drive yourself to the hospital. Summary Supraventricular tachycardia (SVT) is a type of abnormal heart rhythm. During an episode of SVT, your heart rate may be higher than 150 beats per minute. If you do not have symptoms, you may not need treatment. This information is not intended to replace advice given to you by your health care provider. Make sure you discuss any questions you have with your health care provider. Document Revised: 10/30/2019 Document Reviewed: 10/30/2019 Elsevier Patient Education  2023 [employer]   Palpitations Palpitations are feelings that your heartbeat is irregular or is faster than normal. It may feel like your heart is fluttering or skipping a beat. Palpitations may be caused by many things, including smoking, caffeine, alcohol, stress, and certain medicines or drugs. Most causes of palpitations are not serious.  However, some palpitations can be a sign of a serious problem. Further tests and a thorough medical history will be done to find the cause of your palpitations. Your provider may order tests such as an ECG, labs, an echocardiogram, or an ambulatory continuous ECG monitor. Follow these instructions at home: Pay attention to any changes in your symptoms. Let your health care provider know about them. Take these actions to help manage your symptoms: Eating and drinking Follow instructions from your health care provider about eating or drinking restrictions. You may need to avoid foods and drinks that may cause palpitations. These may include: Caffeinated coffee, tea, soft drinks, and energy drinks. Chocolate. Alcohol. Diet pills. Lifestyle     Take steps to reduce your stress and anxiety. Things that can help you relax include: Yoga. Mind-body activities, such as deep breathing, meditation, or using words and images to create positive thoughts (guided imagery). Physical activity, such as swimming, jogging, or walking. Tell your health  care provider if your palpitations increase with activity. If you have chest pain or shortness of breath with activity, do not continue the activity until you are seen by your health care provider. Biofeedback. This is a method that helps you learn to use your mind to control things in your body, such as your heartbeat. Get plenty of rest and sleep. Keep a regular bed time. Do not use drugs, including cocaine or ecstasy. Do not use marijuana. Do not use any products that contain nicotine or tobacco. These products include cigarettes, chewing tobacco, and vaping devices, such as e-cigarettes. If you need help quitting, ask your health care provider. General instructions Take over-the-counter and prescription medicines only as told by your health care provider. Keep all follow-up visits. This is important. These may include visits for further testing if palpitations do not go away or get worse. Contact a health care provider if: You continue to have a fast or irregular heartbeat for a long period of time. You notice that your palpitations occur more often. Get help right away if: You  have chest pain or shortness of breath. You have a severe headache. You feel dizzy or you faint. These symptoms may represent a serious problem that is an emergency. Do not wait to see if the symptoms will go away. Get medical help right away. Call your local emergency services (911 in the [place] Do not drive yourself to the hospital. Summary Palpitations are feelings that your heartbeat is irregular or is faster than normal. It may feel like your heart is fluttering or skipping a beat. Palpitations may be caused by many things, including smoking, caffeine, alcohol, stress, certain medicines, and drugs. Further tests and a thorough medical history may be done to find the cause of your palpitations. Get help right away if you faint or have chest pain, shortness of breath, severe headache, or dizziness. This information  is not intended to replace advice given to you by your health care provider. Make sure you discuss any questions you have with your health care provider. Document Revised: 08/09/2020 Document Reviewed: 08/09/2020 Elsevier Patient Education  [place].

## 2022-02-06 NOTE — Progress Notes (Signed)
Subjective:  Patient ID: Mackenzie Everett, female    DOB: [birth year]  Age: 61 y.o. MRN: [id number]  CC:  Chief Complaint  Patient presents with   Hypertension    Cardiologist said she did not have Afib, but pt still gives out of breath Pt was SOB walking in here   foot pain    Pt states she doesn't know if her plantar fascitis is messing up but her left foot is really tender and hurting     HPI Mackenzie Everett presents for   Hypertension: With history of palpitations, evaluated in June by cardiology.  Cardiac monitor previously showed 2 episodes of SVT, longest lasting 7 beats, rare PAC, PVC, but no A-fib.  Echo with EF 65%.  Hypertension treated with losartan HCTZ 50/12.5 mg daily, Toprol-XL 25 mg daily.  Has noted episodes of feeling tired, with walking, folding laundry, walking to car, etc. Random times, notes faster heart rate when occurs.  No treatment - just rest and resolves in about 5 mins. HR 120's on Apple watch. No near-syncope. Some associated dyspnea. Last noticed on way into office today and folding laundry yesterday.  No melena, hematochezia. No chest pain. Still on toprol as above.   Home readings: 130/54-74 BP Readings from Last 3 Encounters:  02/06/22 (!) 138/58  02/01/22 124/70  01/27/22 116/75   Lab Results  Component Value Date   CREATININE 0.99 10/29/2021    Left foot pain  - past 2 weeks, noticed when work up one morning. Nki. Back of heel. More sore after prolonged sitting. Sore to rub on area from shoe.  About the same. No new exercises/activities. No hx of gout. More walking last weekend.  Tx: ice, rom.    History Patient Active Problem List   Diagnosis Date Noted   OSA (obstructive sleep apnea) 07/18/2021   Paroxysmal A-fib (HCC) 07/18/2021   Hypertension 06/24/2020   Pre-op evaluation 06/22/2020   Hepatitis C 01/19/2016   Obesity, Class III, BMI 40-49.9 (morbid obesity) (HCC) 09/04/2011   Hyperlipidemia 09/04/2011   Migraine variant 09/04/2011    Asthma with allergic rhinitis 09/04/2011   Degenerative disc disease, lumbar 09/04/2011   Past Medical History:  Diagnosis Date   Allergy    Arthritis    hands and knees   Asthma    exercise induced   Hyperlipidemia 09/04/2011   Hypertension    Past Surgical History:  Procedure Laterality Date   APPENDECTOMY  1998   ARTERY BIOPSY Left 01/31/2021   Procedure: LEFT TEMPORAL ARTERY BIOPSY;  Surgeon: Abigail Miyamoto, MD;  Location: [facility];  Service: General;  Laterality: Left;   BREAST CYST EXCISION Right 1980   KNEE ARTHROSCOPY Right 06/2020   KNEE SURGERY Left 1994   TUBAL LIGATION  1992   Allergies  Allergen Reactions   Caffeine Other (See Comments)    migraines   Penicillins Hives    Tolerated 3 grams of ancef 01/31/2021   Prior to Admission medications   Medication Sig Start Date End Date Taking? Authorizing Provider  atorvastatin (LIPITOR) 10 MG tablet Take 1 tablet (10 mg total) by mouth daily. 11/08/21  Yes Shade Flood, MD  doxycycline (VIBRAMYCIN) 100 MG capsule Take 1 capsule (100 mg total) by mouth 2 (two) times daily. 01/27/22  Yes [name], PA-C  fluticasone [facility]) 50 MCG/ACT nasal spray Place 2 sprays into both nostrils daily as needed for allergies or rhinitis.   Yes [provider]  losartan-hydrochlorothiazide (HYZAAR) 50-12.5 MG tablet Take  1 tablet by mouth daily. 10/29/21  Yes Shade Flood, MD  metoprolol succinate (TOPROL-XL) 25 MG 24 hr tablet Take 1 tablet (25 mg total) by mouth at bedtime. 10/29/21  Yes Shade Flood, MD  naratriptan (AMERGE) 2.5 MG tablet Take 2.5 mg by mouth as needed for migraine. Take one (1) tablet at onset of headache; if returns or does not resolve, may repeat after 4 hours; do not exceed five (5) mg in 24 hours.   Yes [provider]  traMADol (ULTRAM) 50 MG tablet Take 1 tablet (50 mg total) by mouth every 6 (six) hours as needed for moderate pain or severe pain. 01/31/21  Yes Abigail Miyamoto,  MD  triamcinolone cream (KENALOG) 0.1 % Apply 1 Application topically 2 (two) times daily. 10/29/21  Yes Shade Flood, MD  zonisamide (ZONEGRAN) 100 MG capsule Take 100 mg by mouth daily. 07/03/21  Yes [provider]  predniSONE (DELTASONE) 20 MG tablet Take 2 tablets daily with breakfast. Patient not taking: Reported on 02/06/2022 01/27/22   [name], PA-C  Semaglutide-Weight Management (WEGOVY) 0.25 MG/0.5ML SOAJ Inject 0.25 mg into the skin once a week. Patient not taking: Reported on 12/20/2021 11/08/21   Shade Flood, MD   Social History   Socioeconomic History   Marital status: Single    Spouse name: Not on file   Number of children: 4   Years of education: Not on file   Highest education level: Not on file  Occupational History   Not on file  Tobacco Use   Smoking status: Never   Smokeless tobacco: Never  Vaping Use   Vaping Use: Never used  Substance and Sexual Activity   Alcohol use: Yes    Comment: social   Drug use: No   Sexual activity: Yes    Birth control/protection: Post-menopausal  Other Topics Concern   Not on file  Social History Narrative   Lives at home alone.   Social Determinants of Health   Financial Resource Strain: Not on file  Food Insecurity: Not on file  Transportation Needs: Not on file  Physical Activity: Not on file  Stress: Not on file  Social Connections: Not on file  Intimate Partner Violence: Not on file    Review of Systems Per HPI.  Per HPI.  Objective:   Vitals:   02/06/22 1346  BP: (!) 138/58  Pulse: 94  Temp: 97.6 F (36.4 C)  SpO2: 97%  Weight: (!) 332 lb (150.6 kg)  Height: 5\' 10"  (1.778 m)     Physical Exam Vitals reviewed.  Constitutional:      Appearance: Normal appearance. She is well-developed.  HENT:     Head: Normocephalic and atraumatic.  Eyes:     Conjunctiva/sclera: Conjunctivae normal.     Pupils: Pupils are equal, round, and reactive to light.  Neck:     Vascular: No carotid  bruit.  Cardiovascular:     Rate and Rhythm: Normal rate and regular rhythm.     Heart sounds: Normal heart sounds.     Comments: During most of exam, regular rhythm, rate, but initial auscultation with approximately 3 to 4 seconds of tachycardia, reverted to normal rate.  Patient described typical symptoms during that event. Pulmonary:     Effort: Pulmonary effort is normal.     Breath sounds: Normal breath sounds.  Abdominal:     Palpations: Abdomen is soft. There is no pulsatile mass.     Tenderness: There is no abdominal tenderness.  Musculoskeletal:     Right lower leg: No edema.     Left lower leg: No edema.     Comments: Left foot, ankle, no bony tenderness.  Minimal Achilles tenderness distally without defect, no pain with resisted plantarflexion or Thompson testing.  Primary area of discomfort retrocalcaneal bursa area.  Full range of motion of ankle.  Skin intact without erythema, rash.  Skin:    General: Skin is warm and dry.  Neurological:     Mental Status: She is alert and oriented to person, place, and time.  Psychiatric:        Mood and Affect: Mood normal.        Behavior: Behavior normal.    EKG: EKG, sinus rhythm, rate 78.  Difficult assessment of baseline artifact but appears to be sinus rhythm.  No apparent acute ST or T wave changes from previous EKG accounting for baseline artifact.  Assessment & Plan:  Mackenzie Everett is a 61 y.o. female . Palpitations - Plan: EKG 12-Lead, CBC, Basic metabolic panel, TSH  -On initial exam I did hear tachycardia that quickly reverted to normal rate.  Based on her description of symptoms and previous heart monitor notated in SVT, I am suspicious that that has recurred and these may be short bursts of SVT that she is experiencing.  Recommended she contact cardiology regarding the symptoms and frequency, continue beta-blocker for now, check CBC, BMP, TSH.  ER/EMS precautions discussed.  Essential hypertension - Plan: EKG  12-Lead  -Labs above, stable with current regimen.  Continue same.  Follow-up with labs in 3 months.  Postcalcaneal bursitis of left foot  -Retrocalcaneal bursitis based on exam, could have component of Achilles tendinosis.  Suspect component of hallux deformity.  Imaging deferred at this time.  Heel cord stretches, topical ice intermittently, range of motion, avoidance of constrictive footwear especially on posterior heel, and recheck 2 weeks if not improving.  Topical diclofenac if needed.  No orders of the defined types were placed in this encounter.  Patient Instructions  Please call cardiology for follow-up and advise them that you are having more frequent palpitations.  I will check some labs today.  If any worsening symptoms, chest pain, worsening shortness of breath be seen in the emergency room.  Pain in the left heel appears to be retrocalcaneal bursitis or inflammation/pain at the fluid-filled sac behind the heel bone.  This can sometimes be associated with something called a Haglund's deformity or extra bone in that area.  Try to avoid footwear that presses on the back of the heel.  Calf stretches throughout the day can be helpful, ice over area if needed for short-term, 10 minutes at a time.  Topical diclofenac okay temporarily.  Please recheck in 2 weeks as we may need to check x-ray at that time if persistent pain.  Sooner if worse.  Palpitations Palpitations are feelings that your heartbeat is irregular or is faster than normal. It may feel like your heart is fluttering or skipping a beat. Palpitations may be caused by many things, including smoking, caffeine, alcohol, stress, and certain medicines or drugs. Most causes of palpitations are not serious.  However, some palpitations can be a sign of a serious problem. Further tests and a thorough medical history will be done to find the cause of your palpitations. Your provider may order tests such as an ECG, labs, an echocardiogram, or  an ambulatory continuous ECG monitor. Follow these instructions at home: Pay attention to any changes in  your symptoms. Let your health care provider know about them. Take these actions to help manage your symptoms: Eating and drinking Follow instructions from your health care provider about eating or drinking restrictions. You may need to avoid foods and drinks that may cause palpitations. These may include: Caffeinated coffee, tea, soft drinks, and energy drinks. Chocolate. Alcohol. Diet pills. Lifestyle     Take steps to reduce your stress and anxiety. Things that can help you relax include: Yoga. Mind-body activities, such as deep breathing, meditation, or using words and images to create positive thoughts (guided imagery). Physical activity, such as swimming, jogging, or walking. Tell your health care provider if your palpitations increase with activity. If you have chest pain or shortness of breath with activity, do not continue the activity until you are seen by your health care provider. Biofeedback. This is a method that helps you learn to use your mind to control things in your body, such as your heartbeat. Get plenty of rest and sleep. Keep a regular bed time. Do not use drugs, including cocaine or ecstasy. Do not use marijuana. Do not use any products that contain nicotine or tobacco. These products include cigarettes, chewing tobacco, and vaping devices, such as e-cigarettes. If you need help quitting, ask your health care provider. General instructions Take over-the-counter and prescription medicines only as told by your health care provider. Keep all follow-up visits. This is important. These may include visits for further testing if palpitations do not go away or get worse. Contact a health care provider if: You continue to have a fast or irregular heartbeat for a long period of time. You notice that your palpitations occur more often. Get help right away if: You have  chest pain or shortness of breath. You have a severe headache. You feel dizzy or you faint. These symptoms may represent a serious problem that is an emergency. Do not wait to see if the symptoms will go away. Get medical help right away. Call your local emergency services (911 in the [place] Do not drive yourself to the hospital. Summary Palpitations are feelings that your heartbeat is irregular or is faster than normal. It may feel like your heart is fluttering or skipping a beat. Palpitations may be caused by many things, including smoking, caffeine, alcohol, stress, certain medicines, and drugs. Further tests and a thorough medical history may be done to find the cause of your palpitations. Get help right away if you faint or have chest pain, shortness of breath, severe headache, or dizziness. This information is not intended to replace advice given to you by your health care provider. Make sure you discuss any questions you have with your health care provider. Document Revised: 08/09/2020 Document Reviewed: 08/09/2020 Elsevier Patient Education  2023 [employer] Inc.     Signed,   Meredith Staggers, MD [facility], [facility] 02/06/22 3:02 PM

## 2022-02-07 ENCOUNTER — Other Ambulatory Visit: Payer: Self-pay | Admitting: Family Medicine

## 2022-02-07 DIAGNOSIS — E785 Hyperlipidemia, unspecified: Secondary | ICD-10-CM

## 2022-02-07 LAB — CBC
HCT: 36.8 % (ref 36.0–46.0)
Hemoglobin: 11.8 g/dL — ABNORMAL LOW (ref 12.0–15.0)
MCHC: 32.2 g/dL (ref 30.0–36.0)
MCV: 78.8 fl (ref 78.0–100.0)
Platelets: 315 10*3/uL (ref 150.0–400.0)
RBC: 4.67 Mil/uL (ref 3.87–5.11)
RDW: 15 % (ref 11.5–15.5)
WBC: 10.6 10*3/uL — ABNORMAL HIGH (ref 4.0–10.5)

## 2022-02-07 LAB — BASIC METABOLIC PANEL
BUN: 20 mg/dL (ref 6–23)
CO2: 27 mEq/L (ref 19–32)
Calcium: 9.4 mg/dL (ref 8.4–10.5)
Chloride: 102 mEq/L (ref 96–112)
Creatinine, Ser: 1.09 mg/dL (ref 0.40–1.20)
GFR: 54.04 mL/min — ABNORMAL LOW (ref >60.00)
Glucose, Bld: 104 mg/dL — ABNORMAL HIGH (ref 70–99)
Potassium: 3.8 mEq/L (ref 3.5–5.1)
Sodium: 138 mEq/L (ref 135–145)

## 2022-02-07 LAB — TSH: TSH: 0.66 u[IU]/mL (ref 0.35–5.50)

## 2022-02-12 ENCOUNTER — Encounter: Payer: Self-pay | Admitting: Family Medicine

## 2022-02-12 DIAGNOSIS — D72829 Elevated white blood cell count, unspecified: Secondary | ICD-10-CM

## 2022-02-13 NOTE — Telephone Encounter (Signed)
Patient wants to know why these are "still high" though appears normal readings previously.   Patient also wants to know if the plan is to just continue to monitor the glucose

## 2022-04-18 ENCOUNTER — Encounter: Payer: Self-pay | Admitting: Family Medicine

## 2022-04-18 ENCOUNTER — Ambulatory Visit (INDEPENDENT_AMBULATORY_CARE_PROVIDER_SITE_OTHER): Payer: BC Managed Care – PPO | Admitting: Family Medicine

## 2022-04-18 VITALS — BP 130/74 | HR 64 | Temp 97.7°F | Ht 70.0 in | Wt 330.4 lb

## 2022-04-18 DIAGNOSIS — R197 Diarrhea, unspecified: Secondary | ICD-10-CM | POA: Diagnosis not present

## 2022-04-18 NOTE — Patient Instructions (Addendum)
Diarrhea may have been due to fruit, sugars and dairy.  Laboratory values improved.  Without treatment, fever, nausea, vomiting or other concerning symptoms at this time I do not think any other testing is needed.  Make sure to continue to drink fluids, just be careful with juices or other high sugar fluids and be careful not to eat too many fruits at one time.  If you are not continuing to improve in the next week or any worsening sooner, let me know and we can look into other testing.  Take care!  Diarrhea, Adult Diarrhea is frequent loose and sometimes watery bowel movements. Diarrhea can make you feel weak and cause you to become dehydrated. Dehydration is a condition in which there is not enough water or other fluids in the body. Dehydration can make you tired and thirsty, cause you to have a dry mouth, and decrease how often you urinate. Diarrhea typically lasts 2-3 days. However, it can last longer if it is a sign of something more serious. It is important to treat your diarrhea as told by your health care provider. Follow these instructions at home: Eating and drinking     Follow these recommendations as told by your health care provider: Take an oral rehydration solution (ORS). This is an over-the-counter medicine that helps return your body to its normal balance of nutrients and water. It is found at pharmacies and retail stores. Drink enough fluid to keep your urine pale yellow. Drink fluids such as water, diluted fruit juice, and low-calorie sports drinks. You can drink milk also, if desired. Sucking on ice chips is another way to get fluids. Avoid drinking fluids that contain a lot of sugar or caffeine, such as soda, energy drinks, and regular sports drinks. Avoid alcohol. Eat bland, easy-to-digest foods in small amounts as you are able. These foods include bananas, applesauce, rice, lean meats, toast, and crackers. Avoid spicy or fatty foods.  Medicines Take over-the-counter and  prescription medicines only as told by your health care provider. If you were prescribed antibiotics, take them as told by your health care provider. Do not stop using the antibiotic even if you start to feel better. General instructions  Wash your hands often using soap and water for at least 20 seconds. If soap and water are not available, use hand sanitizer. Others in the household should wash their hands as well. Hands should be washed: After using the toilet or changing a diaper. Before preparing, cooking, or serving food. While caring for a sick person or while visiting someone in a hospital. Rest at home while you recover. Take a warm bath to relieve any burning or pain from frequent diarrhea episodes. Watch your condition for any changes. Contact a health care provider if: You have a fever. Your diarrhea gets worse. You have new symptoms. You vomit every time you eat or drink. You feel light-headed, dizzy, or have a headache. You have muscle cramps. You have signs of dehydration, such as: Dark urine, very little urine, or no urine. Cracked lips. Dry mouth. Sunken eyes. Sleepiness. Weakness. You have bloody or black stools or stools that look like tar. You have severe pain, cramping, or bloating in your abdomen. Your skin feels cold and clammy. You feel confused. Get help right away if: You have chest pain or your heart is beating very quickly. You have trouble breathing or you are breathing very quickly. You feel extremely weak or you faint. These symptoms may be an emergency. Get help right  away. Call 911. Do not wait to see if the symptoms will go away. Do not drive yourself to the hospital. This information is not intended to replace advice given to you by your health care provider. Make sure you discuss any questions you have with your health care provider. Document Revised: 09/04/2021 Document Reviewed: 09/04/2021 Elsevier Patient Education  [place].

## 2022-04-18 NOTE — Progress Notes (Signed)
Subjective:  Patient ID: Mackenzie Everett, female    DOB: 29-Nov-1960  Age: 62 y.o. MRN: [id number]  CC:  Chief Complaint  Patient presents with   Abdominal Pain    Pt notes a week ago was doing a fast with her church, notes Monday all fruits and this caused diarrhea, next day tried grits, then again had diarrhea Wednesday again, Thursday and Friday  was a bit better with loose stools, pt notes stomach feels strange still and BM not returned to normal yet     HPI Mackenzie Everett presents for   Abdominal Pain: Noted with recent fast at church. Fast started last Monday -  10 days ago able to drink water, smoothie with dairy (has lactose intolerance), grapes/fruits,  caused diarrhea - multiple episodes at lunch. No blood in stools. Added grits next day, potatoes that night with peppers - diarrhea persisted. Diarrhea 4-5 times per day for 3 days. Felt fine otherwise.No fever, n/v, or abd pain Some loose stools, but better 1 week ago.  1 loose stool 5 days ago - better, so restarted fruits and vegetables for breakfast and eggs.  Some feeling of fecal urgency, but no diarrhea 4 days ago. Slight loose stool.  No BM 3 days ago.  1 loose stool 2 days ago, no diarrhea.  No BM yesterday.  Normal BM today, but still slight urgency. R side of stomach felt sore during BM. Better I few seconds.  Cranberry Juice and water during day.  No current abd pain.  No sick contacts. No recent abx, or hospitalization +  Otherwise feels well. Cut out fruits, still on fast - added rice in. Modified fast now.  Tx: peptobismol 3 days ago. Still no fever, feels well otherwise.   History Patient Active Problem List   Diagnosis Date Noted   OSA (obstructive sleep apnea) 07/18/2021   Paroxysmal A-fib ([place]) 07/18/2021   Hypertension 06/24/2020   Pre-op evaluation 06/22/2020   Hepatitis C 01/19/2016   Obesity, Class III, BMI 40-49.9 (morbid obesity) ([place]) 09/04/2011   Hyperlipidemia 09/04/2011   Migraine variant  09/04/2011   Asthma with allergic rhinitis 09/04/2011   Degenerative disc disease, lumbar 09/04/2011   Past Medical History:  Diagnosis Date   Allergy    Arthritis    hands and knees   Asthma    exercise induced   Hyperlipidemia 09/04/2011   Hypertension    Past Surgical History:  Procedure Laterality Date   APPENDECTOMY  1998   ARTERY BIOPSY Left 01/31/2021   Procedure: LEFT TEMPORAL ARTERY BIOPSY;  Surgeon: Coralie Keens, MD;  Location: [facility];  Service: General;  Laterality: Left;   BREAST CYST EXCISION Right 1980   KNEE ARTHROSCOPY Right 06/2020   KNEE SURGERY Left 1994   TUBAL LIGATION  1992   Allergies  Allergen Reactions   Caffeine Other (See Comments)    migraines   Penicillins Hives    Tolerated 3 grams of ancef 01/31/2021   Prior to Admission medications   Medication Sig Start Date End Date Taking? Authorizing Provider  atorvastatin (LIPITOR) 10 MG tablet TAKE 1 TABLET BY MOUTH EVERY DAY 02/08/22  Yes Wendie Agreste, MD  doxycycline (VIBRAMYCIN) 100 MG capsule Take 1 capsule (100 mg total) by mouth 2 (two) times daily. 01/27/22  Yes Jaynee Eagles, PA-C  fluticasone [facility]) 50 MCG/ACT nasal spray Place 2 sprays into both nostrils daily as needed for allergies or rhinitis.   Yes [provider]  losartan-hydrochlorothiazide (HYZAAR) 50-12.5 MG  tablet Take 1 tablet by mouth daily. 10/29/21  Yes Shade Flood, MD  metoprolol succinate (TOPROL-XL) 25 MG 24 hr tablet Take 1 tablet (25 mg total) by mouth at bedtime. 10/29/21  Yes Shade Flood, MD  naratriptan (AMERGE) 2.5 MG tablet Take 2.5 mg by mouth as needed for migraine. Take one (1) tablet at onset of headache; if returns or does not resolve, may repeat after 4 hours; do not exceed five (5) mg in 24 hours.   Yes [provider]  traMADol (ULTRAM) 50 MG tablet Take 1 tablet (50 mg total) by mouth every 6 (six) hours as needed for moderate pain or severe pain. 01/31/21  Yes Abigail Miyamoto,  MD  triamcinolone cream (KENALOG) 0.1 % Apply 1 Application topically 2 (two) times daily. 10/29/21  Yes Shade Flood, MD  zonisamide (ZONEGRAN) 100 MG capsule Take 100 mg by mouth daily. 07/03/21  Yes [provider]  predniSONE (DELTASONE) 20 MG tablet Take 2 tablets daily with breakfast. Patient not taking: Reported on 02/06/2022 01/27/22   [name], PA-C   Social History   Socioeconomic History   Marital status: Single    Spouse name: Not on file   Number of children: 4   Years of education: Not on file   Highest education level: Not on file  Occupational History   Not on file  Tobacco Use   Smoking status: Never   Smokeless tobacco: Never  Vaping Use   Vaping Use: Never used  Substance and Sexual Activity   Alcohol use: Yes    Comment: social   Drug use: No   Sexual activity: Yes    Birth control/protection: Post-menopausal  Other Topics Concern   Not on file  Social History Narrative   Lives at home alone.   Social Determinants of Health   Financial Resource Strain: Not on file  Food Insecurity: Not on file  Transportation Needs: Not on file  Physical Activity: Not on file  Stress: Not on file  Social Connections: Not on file  Intimate Partner Violence: Not on file    Review of Systems   Objective:   Vitals:   04/18/22 1428  BP: 130/74  Pulse: 64  Temp: 97.7 F (36.5 C)  TempSrc: Oral  SpO2: 98%  Weight: (!) 330 lb 6.4 oz (149.9 kg)  Height: 5\' 10"  (1.778 m)     Physical Exam Constitutional:      General: She is not in acute distress.    Appearance: Normal appearance. She is well-developed. She is not ill-appearing, toxic-appearing or diaphoretic.  HENT:     Head: Normocephalic and atraumatic.  Cardiovascular:     Rate and Rhythm: Normal rate.  Pulmonary:     Effort: Pulmonary effort is normal.  Abdominal:     General: Bowel sounds are normal.     Tenderness: There is no abdominal tenderness. There is no right CVA tenderness,  left CVA tenderness or guarding. Negative signs include Murphy's sign.  Neurological:     Mental Status: She is alert and oriented to person, place, and time.  Psychiatric:        Mood and Affect: Mood normal.      Assessment & Plan:  Mackenzie Everett is a 62 y.o. female . Diarrhea, unspecified type Isolated diarrhea last week after start of fast, increase fruits, possible component of dairy initially as well.  Some persistent fruit juice may have also been contributing.  Overall symptoms have improved with normal bowel  movement today.  No fevers, nausea/vomiting or current abdominal pain and fleeting symptoms during bowel movement earlier.  Less likely infectious gastroenteritis, no known sick contacts, no recent antibiotics or hospitalizations.  No abdominal pain, unlikely diverticulitis.  With symptom improvement and reassuring exam currently will hold on further evaluation, testing or imaging with RTC precautions given.  Avoidance of excessive sugars or fruits for now.  No orders of the defined types were placed in this encounter.  Patient Instructions  Diarrhea may have been due to fruit, sugars and dairy.  Laboratory values improved.  Without treatment, fever, nausea, vomiting or other concerning symptoms at this time I do not think any other testing is needed.  Make sure to continue to drink fluids, just be careful with juices or other high sugar fluids and be careful not to eat too many fruits at one time.  If you are not continuing to improve in the next week or any worsening sooner, let me know and we can look into other testing.  Take care!  Diarrhea, Adult Diarrhea is frequent loose and sometimes watery bowel movements. Diarrhea can make you feel weak and cause you to become dehydrated. Dehydration is a condition in which there is not enough water or other fluids in the body. Dehydration can make you tired and thirsty, cause you to have a dry mouth, and decrease how often you  urinate. Diarrhea typically lasts 2-3 days. However, it can last longer if it is a sign of something more serious. It is important to treat your diarrhea as told by your health care provider. Follow these instructions at home: Eating and drinking     Follow these recommendations as told by your health care provider: Take an oral rehydration solution (ORS). This is an over-the-counter medicine that helps return your body to its normal balance of nutrients and water. It is found at pharmacies and retail stores. Drink enough fluid to keep your urine pale yellow. Drink fluids such as water, diluted fruit juice, and low-calorie sports drinks. You can drink milk also, if desired. Sucking on ice chips is another way to get fluids. Avoid drinking fluids that contain a lot of sugar or caffeine, such as soda, energy drinks, and regular sports drinks. Avoid alcohol. Eat bland, easy-to-digest foods in small amounts as you are able. These foods include bananas, applesauce, rice, lean meats, toast, and crackers. Avoid spicy or fatty foods.  Medicines Take over-the-counter and prescription medicines only as told by your health care provider. If you were prescribed antibiotics, take them as told by your health care provider. Do not stop using the antibiotic even if you start to feel better. General instructions  Wash your hands often using soap and water for at least 20 seconds. If soap and water are not available, use hand sanitizer. Others in the household should wash their hands as well. Hands should be washed: After using the toilet or changing a diaper. Before preparing, cooking, or serving food. While caring for a sick person or while visiting someone in a hospital. Rest at home while you recover. Take a warm bath to relieve any burning or pain from frequent diarrhea episodes. Watch your condition for any changes. Contact a health care provider if: You have a fever. Your diarrhea gets worse. You  have new symptoms. You vomit every time you eat or drink. You feel light-headed, dizzy, or have a headache. You have muscle cramps. You have signs of dehydration, such as: Dark urine, very little urine, or  no urine. Cracked lips. Dry mouth. Sunken eyes. Sleepiness. Weakness. You have bloody or black stools or stools that look like tar. You have severe pain, cramping, or bloating in your abdomen. Your skin feels cold and clammy. You feel confused. Get help right away if: You have chest pain or your heart is beating very quickly. You have trouble breathing or you are breathing very quickly. You feel extremely weak or you faint. These symptoms may be an emergency. Get help right away. Call 911. Do not wait to see if the symptoms will go away. Do not drive yourself to the hospital. This information is not intended to replace advice given to you by your health care provider. Make sure you discuss any questions you have with your health care provider. Document Revised: 09/04/2021 Document Reviewed: 09/04/2021 Elsevier Patient Education  [place],   Merri Ray, MD [place], [employer] [facility] 04/18/22 3:15 PM

## 2022-05-03 ENCOUNTER — Other Ambulatory Visit: Payer: Self-pay | Admitting: Family Medicine

## 2022-05-03 DIAGNOSIS — E785 Hyperlipidemia, unspecified: Secondary | ICD-10-CM

## 2022-07-16 ENCOUNTER — Other Ambulatory Visit: Payer: Self-pay | Admitting: Family Medicine

## 2022-07-16 DIAGNOSIS — E785 Hyperlipidemia, unspecified: Secondary | ICD-10-CM

## 2022-07-16 DIAGNOSIS — I10 Essential (primary) hypertension: Secondary | ICD-10-CM

## 2022-09-18 ENCOUNTER — Other Ambulatory Visit: Payer: Self-pay | Admitting: Family Medicine

## 2022-09-18 DIAGNOSIS — I10 Essential (primary) hypertension: Secondary | ICD-10-CM

## 2022-10-09 ENCOUNTER — Other Ambulatory Visit: Payer: Self-pay | Admitting: Family Medicine

## 2022-10-09 DIAGNOSIS — E785 Hyperlipidemia, unspecified: Secondary | ICD-10-CM

## 2023-01-16 ENCOUNTER — Other Ambulatory Visit: Payer: Self-pay | Admitting: Family Medicine

## 2023-01-16 DIAGNOSIS — E785 Hyperlipidemia, unspecified: Secondary | ICD-10-CM

## 2023-01-16 NOTE — Telephone Encounter (Signed)
Last visit 02/06/2022 Please advise

## 2023-01-16 NOTE — Telephone Encounter (Signed)
Needs ov and labs. Temporary refill granted. Please schedule appt.

## 2023-05-04 IMAGING — MR MR HEAD WO/W CM
14 series · 48 of 48 positions shown · IV contrast (multihance)
Comparison: None.

CLINICAL DATA: Patient complains of headaches near temple x 6
months. No known injury, or prior head surgery. Patient denies
cancer and states she has no prior imaging.

EXAM:
MRI HEAD WITHOUT AND WITH CONTRAST
TECHNIQUE: Multiplanar, multiecho pulse sequences of the brain and surrounding
structures were obtained without and with intravenous contrast.
CONTRAST:  20mL MULTIHANCE GADOBENATE DIMEGLUMINE 529 MG/ML IV SOLN

[Series 5: T1 · sagittal · 4.0mm · 0.75mm/px · 1 of 31 slices shown (1 of 3)]
[im 1/31]
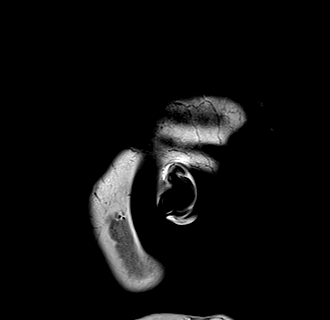

[Series 6: DWI · axial · 3.0mm · 0.94mm/px · z∈[-63,+88]mm · 8 of 172 slices shown (1 of 3)]
[im 1/172]
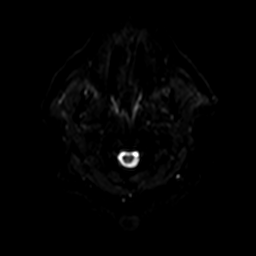
[im 25/172]
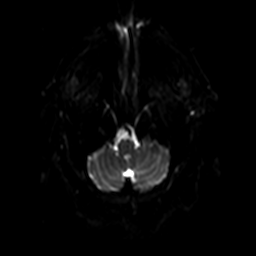
[im 49/172]
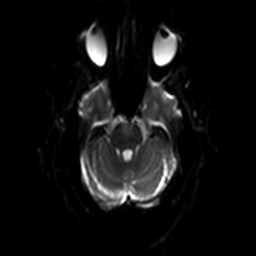
[im 74/172]
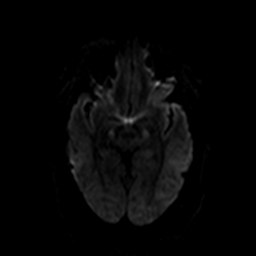
[im 98/172]
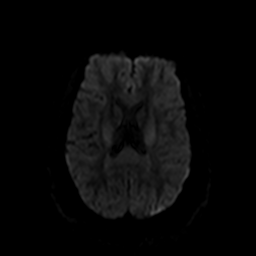
[im 123/172]
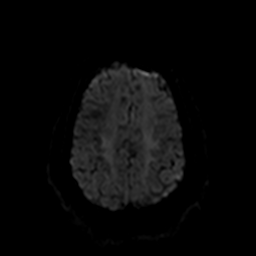
[im 147/172]
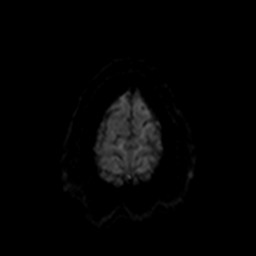
[im 172/172]
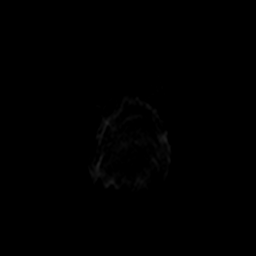

[Series 7: ax dwi_tracew · axial · 3.0mm · 0.94mm/px · z∈[-63,+88]mm · 4 of 86 slices shown]
[im 1/86]
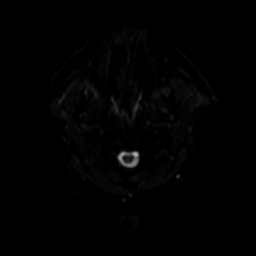
[im 29/86]
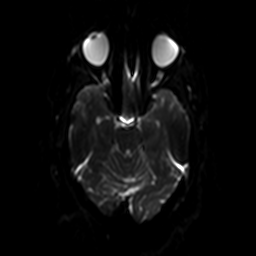
[im 57/86]
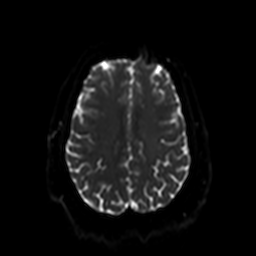
[im 86/86]
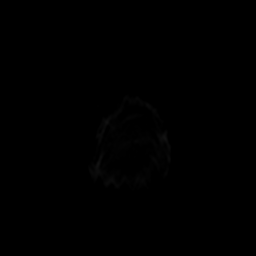

[Series 8: ax dwi_adc · axial · 3.0mm · 0.94mm/px · z∈[-63,+88]mm · 2 of 43 slices shown]
[im 1/43]
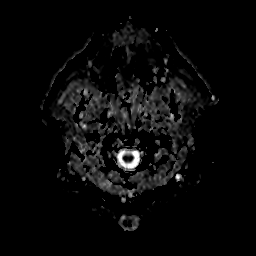
[im 43/43]
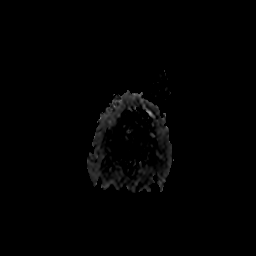

[Series 9: DWI · coronal · 5.0mm · 1.44mm/px · 3 of 68 slices shown (2 of 3)]
[im 1/68]
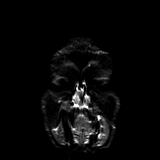
[im 34/68]
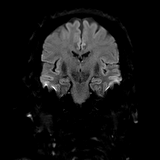
[im 68/68]
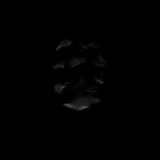

[Series 10: DWI · coronal · 5.0mm · 1.44mm/px · 2 of 34 slices shown (3 of 3)]
[im 1/34]
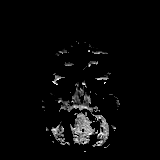
[im 34/34]
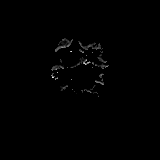

[Series 11: T2 · axial · 4.0mm · 0.36mm/px · 1 of 29 slices shown]
[im 1/29]
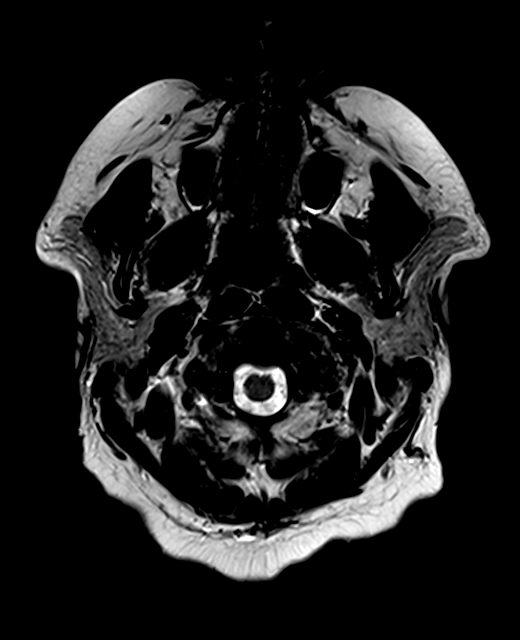

[Series 12: FLAIR · axial · 3.0mm · 0.72mm/px · 1 of 26 slices shown]
[im 1/26]
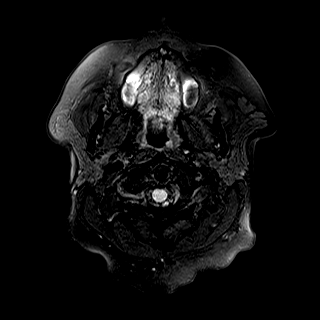

[Series 13: swi_images · axial · 1.5mm · 0.90mm/px · z∈[-58,+84]mm · 4 of 96 slices shown]
[im 1/96]
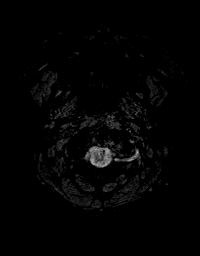
[im 32/96]
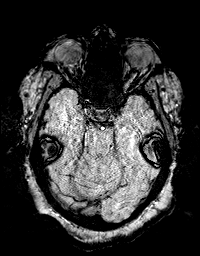
[im 64/96]
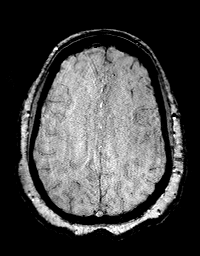
[im 96/96]
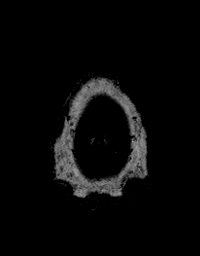

[Series 14: mip_images(sw) · axial · 12.0mm · 0.90mm/px · z∈[-53,+79]mm · 4 of 89 slices shown]
[im 1/89]
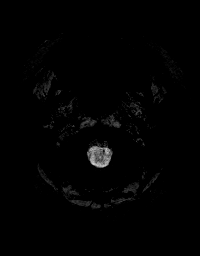
[im 30/89]
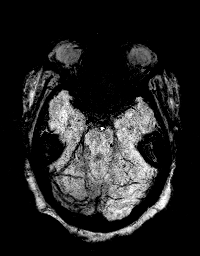
[im 59/89]
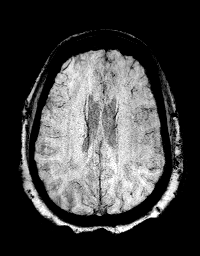
[im 89/89]
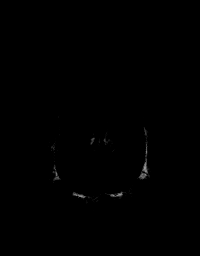

[Series 15: T1 · axial · 1.0mm · 0.94mm/px · z∈[-67,+92]mm · 7 of 160 slices shown (2 of 3)]
[im 1/160]
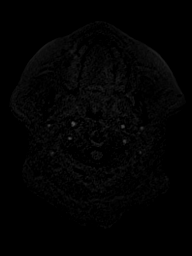
[im 27/160]
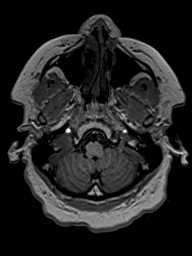
[im 54/160]
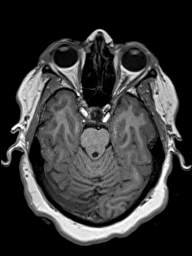
[im 80/160]
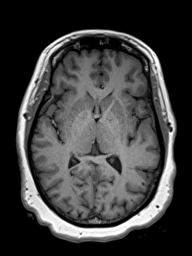
[im 107/160]
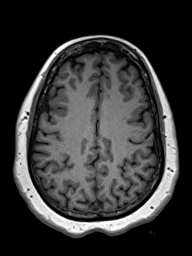
[im 133/160]
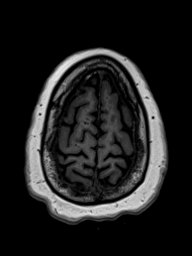
[im 160/160]
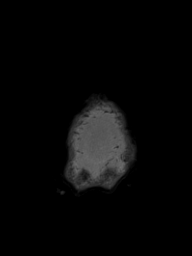

[Series 16: T2 post-contrast · coronal · 4.5mm · 0.36mm/px · 2 of 35 slices shown]
[im 1/35]
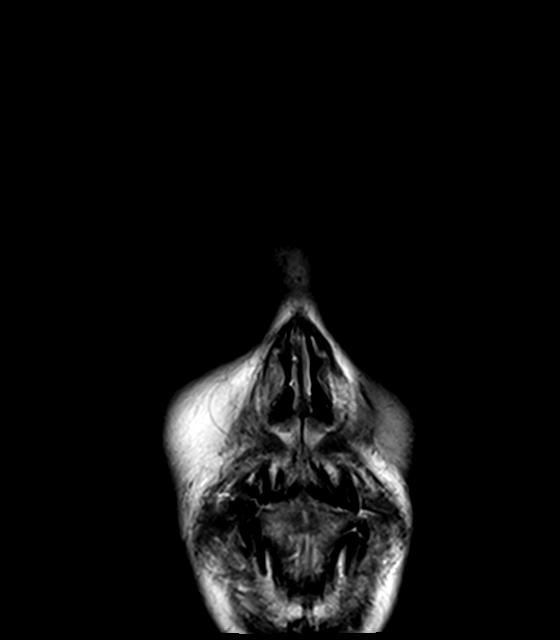
[im 35/35]
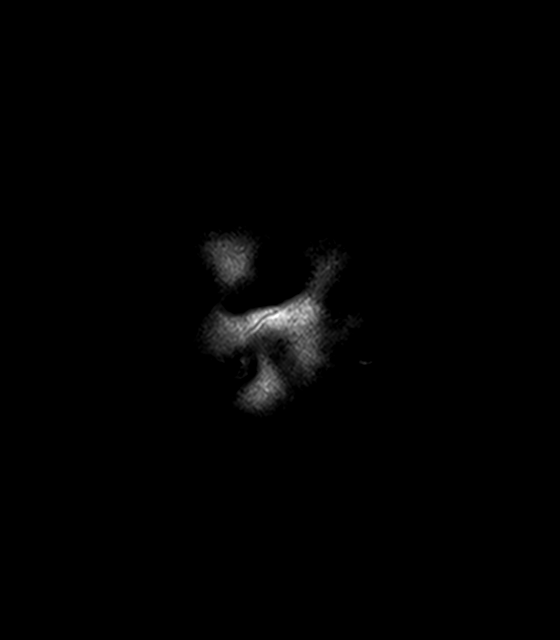

[Series 17: T1 · axial · 1.0mm · 0.94mm/px · z∈[-67,+92]mm · 7 of 160 slices shown (3 of 3)]
[im 1/160]
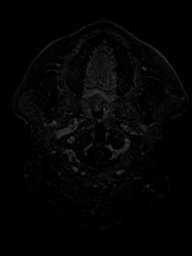
[im 27/160]
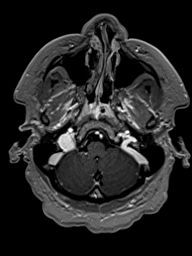
[im 54/160]
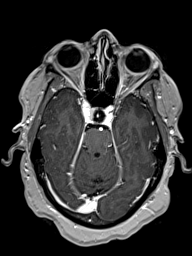
[im 80/160]
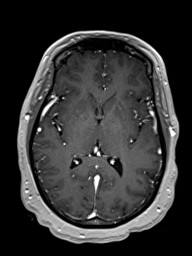
[im 107/160]
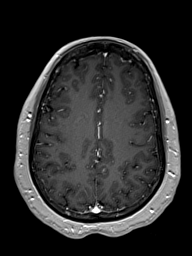
[im 133/160]
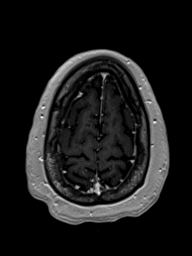
[im 160/160]
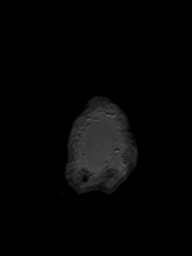

[Series 18: T1 post-contrast · coronal · 4.5mm · 0.72mm/px · 2 of 35 slices shown]
[im 1/35]
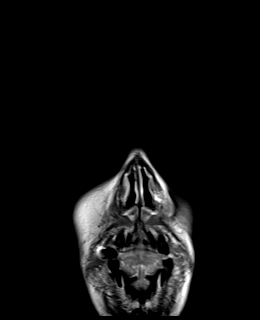
[im 35/35]
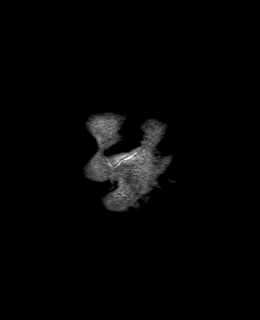

[48 of 48 positions shown; findings below may reference images not displayed]

FINDINGS: Brain: No acute infarction, hemorrhage, hydrocephalus, extra-axial
collection or mass lesion. Normal cerebral white matter. Normal
enhancement postcontrast administration

Vascular: Normal arterial flow voids

Skull and upper cervical spine: Negative

Sinuses/Orbits: Paranasal sinuses clear.  Negative orbit

Other: None
IMPRESSION: Negative MRI head with contrast.
